# Patient Record
Sex: Female | Born: 1970
Health system: Southern US, Community
[De-identification: ages and names within clinical notes are randomized; demographics above are authoritative.]

## PROBLEM LIST (undated history)

## (undated) DIAGNOSIS — E559 Vitamin D deficiency, unspecified: Secondary | ICD-10-CM

## (undated) DIAGNOSIS — F988 Other specified behavioral and emotional disorders with onset usually occurring in childhood and adolescence: Secondary | ICD-10-CM

## (undated) DIAGNOSIS — I10 Essential (primary) hypertension: Secondary | ICD-10-CM

---

## 2004-09-23 HISTORY — PX: CHOLECYSTECTOMY: SHX55

## 2006-12-29 ENCOUNTER — Other Ambulatory Visit: Payer: Self-pay

## 2006-12-29 ENCOUNTER — Emergency Department: Payer: Self-pay | Admitting: Emergency Medicine

## 2007-01-22 ENCOUNTER — Ambulatory Visit: Payer: Self-pay | Admitting: Surgery

## 2007-01-29 ENCOUNTER — Ambulatory Visit: Payer: Self-pay | Admitting: Surgery

## 2008-02-05 ENCOUNTER — Ambulatory Visit: Payer: Self-pay | Admitting: Family Medicine

## 2012-01-16 ENCOUNTER — Emergency Department: Payer: Self-pay | Admitting: Emergency Medicine

## 2012-01-16 LAB — BASIC METABOLIC PANEL
Chloride: 107 mmol/L (ref 98–107)
Creatinine: 0.86 mg/dL (ref 0.60–1.30)
EGFR (African American): >60
Glucose: 92 mg/dL (ref 65–99)
Osmolality: 281 (ref 275–301)
Potassium: 4.1 mmol/L (ref 3.5–5.1)
Sodium: 140 mmol/L (ref 136–145)

## 2012-01-16 LAB — CBC
HCT: 38.7 % (ref 35.0–47.0)
HGB: 12.8 g/dL (ref 12.0–16.0)
MCH: 30.1 pg (ref 26.0–34.0)
MCHC: 33.1 g/dL (ref 32.0–36.0)
MCV: 91 fL (ref 80–100)
Platelet: 276 10*3/uL (ref 150–440)
RBC: 4.24 10*6/uL (ref 3.80–5.20)
RDW: 13.8 % (ref 11.5–14.5)
WBC: 9.4 10*3/uL (ref 3.6–11.0)

## 2012-01-16 LAB — TROPONIN I: Troponin-I: <0.02 ng/mL

## 2012-10-31 ENCOUNTER — Ambulatory Visit: Payer: Self-pay | Admitting: Emergency Medicine

## 2014-10-24 HISTORY — PX: HERNIA REPAIR: SHX51

## 2014-10-28 ENCOUNTER — Inpatient Hospital Stay: Payer: Self-pay | Admitting: Surgery

## 2014-10-28 LAB — COMPREHENSIVE METABOLIC PANEL
AST: 27 U/L (ref 15–37)
Albumin: 3.8 g/dL (ref 3.4–5.0)
Alkaline Phosphatase: 62 U/L (ref 46–116)
Anion Gap: 7 (ref 7–16)
BUN: 15 mg/dL (ref 7–18)
Bilirubin,Total: 0.4 mg/dL (ref 0.2–1.0)
Calcium, Total: 8.8 mg/dL (ref 8.5–10.1)
Chloride: 106 mmol/L (ref 98–107)
Co2: 28 mmol/L (ref 21–32)
Creatinine: 1.04 mg/dL (ref 0.60–1.30)
EGFR (African American): >60
EGFR (Non-African Amer.): >60
Glucose: 83 mg/dL (ref 65–99)
OSMOLALITY: 281 (ref 275–301)
POTASSIUM: 3.2 mmol/L — AB (ref 3.5–5.1)
SGPT (ALT): 27 U/L (ref 14–63)
Sodium: 141 mmol/L (ref 136–145)
TOTAL PROTEIN: 7.4 g/dL (ref 6.4–8.2)

## 2014-10-28 LAB — CBC
HCT: 37.8 % (ref 35.0–47.0)
HGB: 12.6 g/dL (ref 12.0–16.0)
MCH: 30 pg (ref 26.0–34.0)
MCHC: 33.4 g/dL (ref 32.0–36.0)
MCV: 90 fL (ref 80–100)
PLATELETS: 299 10*3/uL (ref 150–440)
RBC: 4.2 10*6/uL (ref 3.80–5.20)
RDW: 14.6 % — ABNORMAL HIGH (ref 11.5–14.5)
WBC: 7 10*3/uL (ref 3.6–11.0)

## 2014-10-28 LAB — URINALYSIS, COMPLETE
Bacteria: NONE SEEN
Specific Gravity: 1.015 (ref 1.003–1.030)
Squamous Epithelial: 1

## 2014-12-21 DIAGNOSIS — N921 Excessive and frequent menstruation with irregular cycle: Secondary | ICD-10-CM | POA: Insufficient documentation

## 2014-12-22 ENCOUNTER — Ambulatory Visit: Admit: 2014-12-22 | Disposition: A | Discharge status: Home / Self Care | Payer: Self-pay | Admitting: Family Medicine

## 2015-01-16 LAB — SURGICAL PATHOLOGY

## 2015-01-22 NOTE — H&P (Signed)
   Subjective/Chief Complaint Umbilical pain x 2 weeks, worsening, N/V x 3 days   History of Present Illness Cheyenne Walker is a pleasant 44 yo F with a history of cholecystectomy in 2008 who presents with 2 weeks of worsening umbilical pain, worse over the past day, and N/V x 3 days.  Was told that she had a hernia but that it was too small to fix.  Per ER attending, had a tender bulge which disappeared with pressure.  Still with pain.  Doing well prior to this.  No fevers/chills.  Having regular BM.   Past History H/o cholecystectomy 2008 HTN   Past Medical Health Hypertension   Code Status Full Code   Past Med/Surgical Hx:  HTN:   ALLERGIES:  No Known Allergies:   Family and Social History:  Family History Negative   Social History negative tobacco, negative ETOH, negative Illicit drugs   Place of Living Home   Review of Systems:  Subjective/Chief Complaint Umbilical pain, ? bulge, N/V   Fever/Chills No   Cough No   Sputum No   Abdominal Pain Yes   Diarrhea No   Constipation No   Nausea/Vomiting Yes   SOB/DOE No   Chest Pain No   Dysuria No   Tolerating Diet No  Nauseated  Vomiting   Physical Exam:  GEN well developed, well nourished, no acute distress, obese   HEENT pale conjunctivae, PERRL, good dentition   RESP normal resp effort  clear BS  no use of accessory muscles   CARD regular rate  No LE edema   ABD positive tenderness  positive hernia  No obvious umbilical hernia palpated due to body habitus   LYMPH negative neck, negative axillae   EXTR negative cyanosis/clubbing, negative edema   SKIN normal to palpation, No rashes, No ulcers   NEURO cranial nerves intact, L side weakness, strength:, motor/sensory function intact   PSYCH A+O to time, place, person, good insight, anxious    Assessment/Admission Diagnosis 44 yo with clinical history suggestive of umbilical hernia/incisional hernia but no obvious defect palpated.  Still with BM.  Still  uncomfortable.   Plan Likely with umbilical/incisional hernia.  Will obtain CT to assist with placement of incision and to ensure no other hernias presents.  Will discuss operative intervention pending CT findings.   Electronic Signatures: Floyde Parkins (MD)  (Signed [phone] 09:07)  Authored: CHIEF COMPLAINT and HISTORY, PAST MEDICAL/SURGIAL HISTORY, ALLERGIES, FAMILY AND SOCIAL HISTORY, REVIEW OF SYSTEMS, PHYSICAL EXAM, ASSESSMENT AND PLAN   Last Updated: 05-Feb-16 09:07 by Floyde Parkins (MD)

## 2015-01-22 NOTE — Op Note (Signed)
PATIENT NAME:  Cheyenne Walker, Cheyenne Walker MR#:  [id number] DATE OF BIRTH:  1970-10-07  DATE OF PROCEDURE:  10/28/2014  PREOPERATIVE DIAGNOSIS: Incarcerated ventral hernia.  POSTOPERATIVE DIAGNOSIS: Incarcerated ventral hernia.  PROCEDURES PERFORMED:  1.  Repair of incarcerated ventral hernia with small Ventralex patch.  2.  Partial omentectomy.   SURGEON: Marlyce Huge, MD  ANESTHESIA: General.   ESTIMATED BLOOD LOSS: Minimal.   COMPLICATIONS: None.   SPECIMEN: Incarcerated omentum.   INDICATION FOR SURGERY: [name] is a pleasant 44 year old female who presents with 1 day of periumbilical pain which was severe, nausea and vomiting and a bulge which was reduced by ED doctor. She was brought to the operating room for semiemergent umbilical hernia repair.   DETAILS OF PROCEDURE: Informed consent was obtained. [name] was brought to the operating room suite. She was induced. Endotracheal tube was placed. General anesthesia was administered. Her abdomen was prepped and draped in standard surgical fashion. A timeout was then performed correctly identifying the patient name, operative site and procedure to be performed. A periumbilical incision was made. It was deepened down to the fascia. The hernia sac was encountered. It was dissected out. It was opened. There was incarcerated omentum which could not be reduced. It was thus resected with Kelly clamps and 3-0 ties and then it was reduced. The fascial edges were cleared out. The defect measured approximately 2 x 1 cm. A 4 cm piece of Ventralex mesh was pushed through the defect and the defect was closed transversely using interrupted 0 Ethibond incorporating the mesh while doing so. These were  tied down. The wound was then irrigated. The umbilicus was tacked to the repair with a 3-0 Vicryl suture. The wound was then closed in 2 layers, an interrupted 3-0 Vicryl deep dermal and a 4-0 Monocryl running subcuticular. Steri-Strips, Tegaderm and Telfa  gauze were used to complete the dressing. The patient was then awoken, extubated and brought to the postanesthesia care unit. There were no immediate complications. Needle, sponge, and instrument count was correct at the end of the procedure.   ____________________________ Glena Norfolk. Preston Weill, MD cal:sb D: 10/28/2014 15:23:48 ET T: 10/28/2014 16:02:54 ET JOB#: [id number]  cc: Harrell Gave A. Sebron Mcmahill, MD, <Dictator> Floyde Parkins MD ELECTRONICALLY SIGNED 11/10/2014 22:27

## 2015-04-03 ENCOUNTER — Encounter: Payer: Self-pay | Admitting: Family Medicine

## 2015-04-03 DIAGNOSIS — D229 Melanocytic nevi, unspecified: Secondary | ICD-10-CM | POA: Insufficient documentation

## 2015-04-03 DIAGNOSIS — K429 Umbilical hernia without obstruction or gangrene: Secondary | ICD-10-CM | POA: Insufficient documentation

## 2015-04-03 DIAGNOSIS — F4310 Post-traumatic stress disorder, unspecified: Secondary | ICD-10-CM | POA: Insufficient documentation

## 2015-04-03 DIAGNOSIS — E8881 Metabolic syndrome: Secondary | ICD-10-CM | POA: Insufficient documentation

## 2015-04-03 DIAGNOSIS — F339 Major depressive disorder, recurrent, unspecified: Secondary | ICD-10-CM | POA: Insufficient documentation

## 2015-04-03 DIAGNOSIS — M722 Plantar fascial fibromatosis: Secondary | ICD-10-CM | POA: Insufficient documentation

## 2015-04-03 DIAGNOSIS — K219 Gastro-esophageal reflux disease without esophagitis: Secondary | ICD-10-CM | POA: Insufficient documentation

## 2015-04-03 DIAGNOSIS — Z6841 Body Mass Index (BMI) 40.0 and over, adult: Secondary | ICD-10-CM

## 2015-04-03 DIAGNOSIS — F419 Anxiety disorder, unspecified: Secondary | ICD-10-CM | POA: Insufficient documentation

## 2015-04-03 DIAGNOSIS — E559 Vitamin D deficiency, unspecified: Secondary | ICD-10-CM | POA: Insufficient documentation

## 2015-04-03 DIAGNOSIS — J309 Allergic rhinitis, unspecified: Secondary | ICD-10-CM | POA: Insufficient documentation

## 2015-04-03 DIAGNOSIS — I10 Essential (primary) hypertension: Secondary | ICD-10-CM | POA: Insufficient documentation

## 2015-04-06 ENCOUNTER — Ambulatory Visit: Payer: Self-pay | Admitting: Family Medicine

## 2015-05-31 ENCOUNTER — Ambulatory Visit: Payer: Self-pay | Admitting: Family Medicine

## 2015-06-12 ENCOUNTER — Ambulatory Visit (INDEPENDENT_AMBULATORY_CARE_PROVIDER_SITE_OTHER): Payer: BLUE CROSS/BLUE SHIELD | Admitting: Family Medicine

## 2015-06-12 ENCOUNTER — Encounter: Payer: Self-pay | Admitting: Family Medicine

## 2015-06-12 VITALS — BP 122/84 | HR 93 | Temp 98.4°F | Resp 16 | Ht 64.0 in | Wt 221.1 lb

## 2015-06-12 DIAGNOSIS — N926 Irregular menstruation, unspecified: Secondary | ICD-10-CM | POA: Diagnosis not present

## 2015-06-12 DIAGNOSIS — Z3201 Encounter for pregnancy test, result positive: Secondary | ICD-10-CM | POA: Diagnosis not present

## 2015-06-12 DIAGNOSIS — F329 Major depressive disorder, single episode, unspecified: Secondary | ICD-10-CM

## 2015-06-12 DIAGNOSIS — I10 Essential (primary) hypertension: Secondary | ICD-10-CM

## 2015-06-12 DIAGNOSIS — Z23 Encounter for immunization: Secondary | ICD-10-CM

## 2015-06-12 LAB — POCT URINE PREGNANCY: PREG TEST UR: POSITIVE — AB

## 2015-06-12 MED ORDER — PRENATAL VITAMIN PLUS LOW IRON 27-1 MG PO TABS: 1.0000 | ORAL_TABLET | Freq: Every day | ORAL | Status: DC

## 2015-06-12 MED ORDER — METHYLDOPA 250 MG PO TABS: 250.0000 mg | ORAL_TABLET | Freq: Three times a day (TID) | ORAL | Status: DC

## 2015-06-12 NOTE — Progress Notes (Signed)
Name: Cheyenne Walker   MRN: [id number]    DOB: 06-25-1971   Date:06/12/2015       Progress Note  Subjective  Chief Complaint  Chief Complaint  Patient presents with  . Menstrual Problem    Pt states has not had menstrual since july and will have episodes of abdominal cramping    HPI  Irregular Cycles: she states she skipped her cycle in May and June, had a cycle in July 5th, 2016 , but was on vacation and forgot to take her ocp's with her, she was going to re-start following her next cycle but that never came. She has been having some abdominal cramping intermittently. She is [id number]. She is married for almost 23 years, but their youngest child is 46 yo. She has noticed breast tenderness, no nausea, no increase in appetite.  Pregnancy test was positive today and she is shocked.   HTN: taking medications for swelling and bp, denies chest pain or SOB. BP has been at goal  Obesity: seeing a provider at work and has been taking Phentermine for the past 10 days. Advised her to stop since she is pregnant.    Patient Active Problem List   Diagnosis Date Noted  . Anxiety 04/03/2015  . Benign essential HTN 04/03/2015  . Allergic rhinitis 04/03/2015  . Gastro-esophageal reflux disease without esophagitis 04/03/2015  . Major depressive episode 04/03/2015  . Extreme obesity 04/03/2015  . Dysmetabolic syndrome 93/57/0177  . Numerous moles 04/03/2015  . Plantar fasciitis 04/03/2015  . PTSD (post-traumatic stress disorder) 04/03/2015  . Vitamin D deficiency 04/03/2015  . Umbilical hernia without obstruction and without gangrene 04/03/2015  . Excessive and frequent menstruation with irregular cycle 12/21/2014    History reviewed. No pertinent past surgical history.  History reviewed. No pertinent family history.  Social History   Social History  . Marital Status: Married    Spouse Name: N/A  . Number of Children: N/A  . Years of Education: N/A   Occupational History  . Not on  file.   Social History Main Topics  . Smoking status: Never Smoker   . Smokeless tobacco: Not on file  . Alcohol Use: 0.0 oz/week    0 Standard drinks or equivalent per week  . Drug Use: No  . Sexual Activity: Yes   Other Topics Concern  . Not on file   Social History Narrative  . No narrative on file     Current outpatient prescriptions:  .  ibuprofen (ADVIL,MOTRIN) 800 MG tablet, Take 800 mg by mouth 2 (two) times daily., Disp: , Rfl: 1  Allergies  Allergen Reactions  . Ace Inhibitors     cough     ROS  Constitutional: Negative for fever or weight change.  Respiratory: Negative for cough and shortness of breath.   Cardiovascular: Negative for chest pain or palpitations.  Gastrointestinal:mild pelvic abdominal pain, no bowel changes.  Musculoskeletal: Negative for gait problem or joint swelling.  Skin: Negative for rash.  Neurological: Negative for dizziness or headache.  No other specific complaints in a complete review of systems (except as listed in HPI above).  Objective  Filed Vitals:   06/12/15 1416  BP: 122/84  Pulse: 93  Temp: 98.4 F (36.9 C)  TempSrc: Oral  Resp: 16  Height: 5\' 4"  (1.626 m)  Weight: 221 lb 1.6 oz (100.29 kg)  SpO2: 97%    Body mass index is 37.93 kg/(m^2).  Physical Exam  Constitutional: Patient appears well-developed and well-nourished. Obese  No distress.  HEENT: head atraumatic, normocephalic, pupils equal and reactive to light,neck supple, throat within normal limits Cardiovascular: Normal rate, regular rhythm and normal heart sounds.  No murmur heard. No BLE edema. Pulmonary/Chest: Effort normal and breath sounds normal. No respiratory distress. Abdominal: Soft.  There is no tenderness. Psychiatric: Patient has a normal mood and affect. behavior is normal. Judgment and thought content normal.   Recent Results (from the past 2160 hour(s))  POCT urine pregnancy     Status: Abnormal   Collection Time: 06/12/15  2:34 PM   Result Value Ref Range   Preg Test, Ur Positive (A) Negative    PHQ2/9: Depression screen PHQ 2/9 06/12/2015  Decreased Interest 0  Down, Depressed, Hopeless 1  PHQ - 2 Score 1    Fall Risk: Fall Risk  06/12/2015  Falls in the past year? No    Functional Status Survey: Is the patient deaf or have difficulty hearing?: No Does the patient have difficulty seeing, even when wearing glasses/contacts?: Yes (glasses) Does the patient have difficulty concentrating, remembering, or making decisions?: No Does the patient have difficulty walking or climbing stairs?: No Does the patient have difficulty dressing or bathing?: No Does the patient have difficulty doing errands alone such as visiting a doctor's office or shopping?: No   Assessment & Plan  1. Benign essential HTN  Change to medication that is safe during pregnancy  - methyldopa (ALDOMET) 250 MG tablet; Take 1 tablet (250 mg total) by mouth 3 (three) times daily.  Dispense: 90 tablet; Refill: 0  2. Menstrual periods irregular  - POCT urine pregnancy - positive LMP:03/28/2015 Due date: April 11th, 2017 Gestational Age [redacted] weeks 6 days  3. Pregnancy test positive  Offered to refer her to OB, but she wants to discuss it with her husband first. Advised to avoid tobacco use, alcohol use, and start prenatal vitamins until she sees OB  4. Needs flu shot  - Flu Vaccine QUAD 36+ mos PF IM (Fluarix & Fluzone Quad PF) - she will get it at work   5. Major depressive episode  Feeling down still, but does not want to take medication or see therapist, she denies suicidal thoughts or ideation

## 2015-06-20 ENCOUNTER — Other Ambulatory Visit: Payer: Self-pay | Admitting: Family Medicine

## 2015-06-21 NOTE — Telephone Encounter (Signed)
I am forwarding this encounter to the designated PCP and/or their nursing staff for further management of the tasks requested. Thank you.  

## 2015-06-26 ENCOUNTER — Other Ambulatory Visit: Payer: Self-pay

## 2015-06-29 ENCOUNTER — Encounter: Payer: Self-pay | Admitting: Family Medicine

## 2015-06-29 ENCOUNTER — Ambulatory Visit (INDEPENDENT_AMBULATORY_CARE_PROVIDER_SITE_OTHER): Payer: BLUE CROSS/BLUE SHIELD | Admitting: Family Medicine

## 2015-06-29 VITALS — BP 126/82 | HR 97 | Temp 98.2°F | Resp 16 | Ht 64.0 in | Wt 214.3 lb

## 2015-06-29 DIAGNOSIS — Z8759 Personal history of other complications of pregnancy, childbirth and the puerperium: Secondary | ICD-10-CM | POA: Diagnosis not present

## 2015-06-29 DIAGNOSIS — I10 Essential (primary) hypertension: Secondary | ICD-10-CM

## 2015-06-29 DIAGNOSIS — Z3040 Encounter for surveillance of contraceptives, unspecified: Secondary | ICD-10-CM | POA: Diagnosis not present

## 2015-06-29 LAB — POCT URINE PREGNANCY: PREG TEST UR: POSITIVE — AB

## 2015-06-29 MED ORDER — TRIAMTERENE-HCTZ 50-25 MG PO CAPS: 1.0000 | ORAL_CAPSULE | ORAL | Status: DC

## 2015-06-29 MED ORDER — NORGESTIMATE-ETH ESTRADIOL 0.25-35 MG-MCG PO TABS: 1.0000 | ORAL_TABLET | Freq: Every day | ORAL | Status: DC

## 2015-06-29 NOTE — Progress Notes (Addendum)
Name: Cheyenne Walker   MRN: [id number]    DOB: 1970-12-02   Date:06/29/2015       Progress Note  Subjective  Chief Complaint  Chief Complaint  Patient presents with  . Contraception    Patient terminated pregnant on 06/17/2015 in [place] at [facility] and had the aspiration abortion suction which was successful. After completed they checked by ultrasound to make sure everything was gone due to patient being 12 weeks.     HPI  Contraception: she was [redacted] weeks pregnant when she found out and went to [facility] and had an abortion. She is feeling guilty about it, her husband was there with her. She was given ocp by them, but it was stolen, she has been off ocp since Sunday and spotted on Monday, she had not intercourse since. She wants to go back on Orthocyclen. Explained importance of condoms also for the next month to avoid another unwanted pregnancy. Denies abdominal cramping or vaginal discharge. No fever.  HTN: never filled Methyldopa but needs refill of bp medication. BP is at goal today, denies chest pain or palpitation  Patient Active Problem List   Diagnosis Date Noted  . Anxiety 04/03/2015  . Benign essential HTN 04/03/2015  . Allergic rhinitis 04/03/2015  . Gastro-esophageal reflux disease without esophagitis 04/03/2015  . Major depressive episode 04/03/2015  . Extreme obesity ([place]) 04/03/2015  . Dysmetabolic syndrome 84/66/5993  . Numerous moles 04/03/2015  . Plantar fasciitis 04/03/2015  . PTSD (post-traumatic stress disorder) 04/03/2015  . Vitamin D deficiency 04/03/2015  . Umbilical hernia without obstruction and without gangrene 04/03/2015    Past Surgical History  Procedure Laterality Date  . Hernia repair  10/2014  . Cholecystectomy  2006    Family History  Problem Relation Age of Onset  . Hypertension Mother   . Hypertension Sister   . Hypertension Brother     Social History   Social History  . Marital Status: Married    Spouse Name:  N/A  . Number of Children: N/A  . Years of Education: N/A   Occupational History  . Not on file.   Social History Main Topics  . Smoking status: Never Smoker   . Smokeless tobacco: Never Used  . Alcohol Use: No  . Drug Use: No  . Sexual Activity: Yes   Other Topics Concern  . Not on file   Social History Narrative     Current outpatient prescriptions:  .  ibuprofen (ADVIL,MOTRIN) 800 MG tablet, Take 800 mg by mouth 2 (two) times daily., Disp: , Rfl: 1 .  norgestimate-ethinyl estradiol (ORTHO-CYCLEN, 28,) 0.25-35 MG-MCG tablet, Take 1 tablet by mouth daily., Disp: 1 Package, Rfl: 11 .  Prenatal Vit-Fe Fumarate-FA (PRENATAL VITAMIN PLUS LOW IRON) 27-1 MG TABS, Take 1 tablet by mouth daily. (Patient not taking: Reported on 06/29/2015), Disp: 30 tablet, Rfl: 0  Allergies  Allergen Reactions  . Ace Inhibitors     cough     ROS  Constitutional: Negative for fever or weight change.  Respiratory: Negative for cough and shortness of breath.   Cardiovascular: Negative for chest pain or palpitations.  Gastrointestinal: Negative for abdominal pain, no bowel changes.  Musculoskeletal: Negative for gait problem or joint swelling.  Skin: Negative for rash.  Neurological: Negative for dizziness or headache.  No other specific complaints in a complete review of systems (except as listed in HPI above).  Objective  Filed Vitals:   06/29/15 0936  BP: 126/82  Pulse: 97  Temp:  98.2 F (36.8 C)  TempSrc: Oral  Resp: 16  Height: 5\' 4"  (1.626 m)  Weight: 214 lb 4.8 oz (97.206 kg)  SpO2: 97%    Body mass index is 36.77 kg/(m^2).  Physical Exam  Constitutional: Patient appears well-developed and well-nourished. Obese No distress.  HEENT: head atraumatic, normocephalic, pupils equal and reactive to light, neck supple, throat within normal limits Cardiovascular: Normal rate, regular rhythm and normal heart sounds.  No murmur heard. No BLE edema. Pulmonary/Chest: Effort normal and  breath sounds normal. No respiratory distress. Abdominal: Soft.  There is no tenderness. Psychiatric: Patient has a normal mood and affect. behavior is normal. Judgment and thought content normal.  Recent Results (from the past 2160 hour(s))  POCT urine pregnancy     Status: Abnormal   Collection Time: 06/12/15  2:34 PM  Result Value Ref Range   Preg Test, Ur Positive (A) Negative  POCT urine pregnancy     Status: Abnormal   Collection Time: 06/29/15  9:48 AM  Result Value Ref Range   Preg Test, Ur Positive (A) Negative    Comment: Faint line     PHQ2/9: Depression screen PHQ 2/9 06/12/2015  Decreased Interest 0  Down, Depressed, Hopeless 1  PHQ - 2 Score 1     Fall Risk: Fall Risk  06/12/2015  Falls in the past year? No     Assessment & Plan  1. Abortion history  - POCT urine pregnancy positive, but she just had an abortion, very faint line - no symptoms, resume ocp She is feeling guilty, and already has a history of depression but she does not want to take medications at this time, advised her to return sooner if needed  2. Encounter for surveillance of contraceptives  - norgestimate-ethinyl estradiol (ORTHO-CYCLEN, 28,) 0.25-35 MG-MCG tablet; Take 1 tablet by mouth daily.  Dispense: 1 Package; Refill: 11  3. Benign essential HTN  Refills of medication - triamterene-hydrochlorothiazide (DYAZIDE) 50-25 MG capsule; Take 1 capsule by mouth every morning.  Dispense: 30 capsule; Refill: 3

## 2015-10-02 ENCOUNTER — Ambulatory Visit: Payer: BLUE CROSS/BLUE SHIELD | Admitting: Family Medicine

## 2015-10-12 ENCOUNTER — Ambulatory Visit: Payer: BLUE CROSS/BLUE SHIELD | Admitting: Family Medicine

## 2016-02-02 ENCOUNTER — Emergency Department
Admission: EM | Admit: 2016-02-02 | Discharge: 2016-02-02 | Disposition: A | Discharge status: Home / Self Care | Payer: BLUE CROSS/BLUE SHIELD | Attending: Emergency Medicine | Admitting: Emergency Medicine

## 2016-02-02 DIAGNOSIS — T162XXA Foreign body in left ear, initial encounter: Secondary | ICD-10-CM | POA: Diagnosis present

## 2016-02-02 DIAGNOSIS — Z791 Long term (current) use of non-steroidal anti-inflammatories (NSAID): Secondary | ICD-10-CM | POA: Diagnosis not present

## 2016-02-02 DIAGNOSIS — I10 Essential (primary) hypertension: Secondary | ICD-10-CM | POA: Diagnosis not present

## 2016-02-02 DIAGNOSIS — Y999 Unspecified external cause status: Secondary | ICD-10-CM | POA: Diagnosis not present

## 2016-02-02 DIAGNOSIS — Z79899 Other long term (current) drug therapy: Secondary | ICD-10-CM | POA: Diagnosis not present

## 2016-02-02 DIAGNOSIS — Y929 Unspecified place or not applicable: Secondary | ICD-10-CM | POA: Insufficient documentation

## 2016-02-02 DIAGNOSIS — X58XXXA Exposure to other specified factors, initial encounter: Secondary | ICD-10-CM | POA: Insufficient documentation

## 2016-02-02 DIAGNOSIS — F329 Major depressive disorder, single episode, unspecified: Secondary | ICD-10-CM | POA: Diagnosis not present

## 2016-02-02 DIAGNOSIS — Y939 Activity, unspecified: Secondary | ICD-10-CM | POA: Insufficient documentation

## 2016-02-02 DIAGNOSIS — E669 Obesity, unspecified: Secondary | ICD-10-CM | POA: Diagnosis not present

## 2016-02-02 MED ORDER — LIDOCAINE VISCOUS 2 % MT SOLN
OROMUCOSAL | Status: AC
  Filled 2016-02-02: qty 15

## 2016-02-02 MED ORDER — CIPROFLOXACIN-HYDROCORTISONE 0.2-1 % OT SUSP: 4.0000 [drp] | Freq: Two times a day (BID) | OTIC | Status: AC

## 2016-02-02 NOTE — ED Notes (Signed)
Patient presents hysterical with reports of waking up with something in her LEFT ear. Patient reports that it is moving and is painful. MD to bedside immediately for assessment.

## 2016-02-02 NOTE — ED Notes (Addendum)
Owens Shark, MD to bedside.

## 2016-02-02 NOTE — ED Provider Notes (Signed)
[facility] Emergency Department Provider Note  ____________________________________________  Time seen: 12:35 AM  I have reviewed the triage vital signs and the nursing notes.   HISTORY  Chief Complaint Foreign Body in Ear     HPI Cheyenne Walker is a 45 y.o. female presents with awakening with left ear pain this morning patient states that there is a bug in her ear.     No past medical history on file.  Patient Active Problem List   Diagnosis Date Noted  . Anxiety 04/03/2015  . Benign essential HTN 04/03/2015  . Allergic rhinitis 04/03/2015  . Gastro-esophageal reflux disease without esophagitis 04/03/2015  . Major depressive episode 04/03/2015  . Extreme obesity ([place]) 04/03/2015  . Dysmetabolic syndrome [id number]  . Numerous moles 04/03/2015  . Plantar fasciitis 04/03/2015  . PTSD (post-traumatic stress disorder) 04/03/2015  . Vitamin D deficiency 04/03/2015  . Umbilical hernia without obstruction and without gangrene 04/03/2015    Past Surgical History  Procedure Laterality Date  . Hernia repair  10/2014  . Cholecystectomy  2006    Current Outpatient Rx  Name  Route  Sig  Dispense  Refill  . ibuprofen (ADVIL,MOTRIN) 800 MG tablet   Oral   Take 800 mg by mouth 2 (two) times daily.      1   . norgestimate-ethinyl estradiol (ORTHO-CYCLEN, 28,) 0.25-35 MG-MCG tablet   Oral   Take 1 tablet by mouth daily.   1 Package   11   . Prenatal Vit-Fe Fumarate-FA (PRENATAL VITAMIN PLUS LOW IRON) 27-1 MG TABS   Oral   Take 1 tablet by mouth daily. Patient not taking: Reported on 06/29/2015   30 tablet   0   . triamterene-hydrochlorothiazide (DYAZIDE) 50-25 MG capsule   Oral   Take 1 capsule by mouth every morning.   30 capsule   3     Allergies Ace inhibitors  Family History  Problem Relation Age of Onset  . Hypertension Mother   . Hypertension Sister   . Hypertension Brother     Social History Social History  Substance  Use Topics  . Smoking status: Never Smoker   . Smokeless tobacco: Never Used  . Alcohol Use: No    Review of Systems  Constitutional: Negative for fever. Eyes: Negative for visual changes. ENT: Negative for sore throat.Positive for insect in left ear Cardiovascular: Negative for chest pain. Respiratory: Negative for shortness of breath. Gastrointestinal: Negative for abdominal pain, vomiting and diarrhea. Genitourinary: Negative for dysuria. Musculoskeletal: Negative for back pain. Skin: Negative for rash. Neurological: Negative for headaches, focal weakness or numbness.   10-point ROS otherwise negative.  ____________________________________________   PHYSICAL EXAM:  VITAL SIGNS: ED Triage Vitals  Enc Vitals Group     BP --      Pulse --      Resp --      Temp --      Temp src --      SpO2 --      Weight --      Height --      Head Cir --      Peak Flow --      Pain Score --      Pain Loc --      Pain Edu? --      Excl. in [place]? --      Constitutional: Alert and oriented. Well appearing and in no distress. Eyes: Conjunctivae are normal. PERRL. Normal extraocular movements. ENT   Head:  Normocephalic and atraumatic.   Nose: No congestion/rhinnorhea.   Mouth/Throat: Mucous membranes are moist.   Neck: No stridor. Ears: Insect noted in the patient's left ear Hematological/Lymphatic/Immunilogical: No cervical lymphadenopathy. Cardiovascular: Normal rate, regular rhythm. Normal and symmetric distal pulses are present in all extremities. No murmurs, rubs, or gallops. Respiratory: Normal respiratory effort without tachypnea nor retractions. Breath sounds are clear and equal bilaterally. No wheezes/rales/rhonchi. Gastrointestinal: Soft and nontender. No distention. There is no CVA tenderness. Genitourinary: deferred Musculoskeletal: Nontender with normal range of motion in all extremities. No joint effusions.  No lower extremity tenderness nor  edema. Neurologic:  Normal speech and language. No gross focal neurologic deficits are appreciated. Speech is normal.  Skin:  Skin is warm, dry and intact. No rash noted. Psychiatric: Mood and affect are normal. Speech and behavior are normal. Patient exhibits appropriate insight and judgment.     INITIAL IMPRESSION / ASSESSMENT AND PLAN / ED COURSE  Pertinent labs & imaging results that were available during my care of the patient were reviewed by me and considered in my medical decision making (see chart for details).  Procedure note Insect removed from the patient's left external auditory canal using alligator forceps. Inspection of the external auditory canal following insect removal reveals abrasions in the canal however no injury to the tympanic membrane.  ____________________________________________   FINAL CLINICAL IMPRESSION(S) / ED DIAGNOSES  Final diagnoses:  Foreign body in ear, left, initial encounter      Gregor Hams, MD 02/02/16 0105

## 2016-02-02 NOTE — ED Notes (Signed)

## 2016-02-02 NOTE — ED Notes (Signed)
Viscous lidocaine placed into LEFT ear canal by Dr. Owens Shark.

## 2016-02-02 NOTE — Discharge Instructions (Signed)
Ear Foreign Body °An ear foreign body is an object that is stuck in your ear. The object is usually stuck in the ear canal. °CAUSES °In all ages of people, the most common foreign bodies are insects that enter the ear canal. It is common for young children to put objects into the ear canal. These may include pebbles, beads, parts of toys, and any other small objects that fit into the ear. In adults, objects such as cotton swabs may become lodged in the ear canal.  °SIGNS AND SYMPTOMS °A foreign body in the ear may cause: °· Pain. °· Buzzing or roaring sounds. °· Hearing loss. °· Ear drainage or bleeding. °· Nausea and vomiting. °· A feeling that your ear is full. °DIAGNOSIS °Your health care provider may be able to diagnose an ear foreign body based on the information that you provide, your symptoms, and a physical exam. Your health care provider may also perform tests, such as testing your hearing and your ear pressure, to check for infection or other problems that are caused by the foreign body in your ear. °TREATMENT °Treatment depends on what the foreign body is, the location of the foreign body in your ear, and whether or not the foreign body has injured any part of your inner ear. If the foreign body is visible to your health care provider, it may be possible to remove the foreign body using: °· A tool, such as medical tweezers (forceps) or a suction tube (catheter). °· Irrigation. This uses water to flush the foreign body out of your ear. This is used only if the foreign body is not likely to swell or enlarge when it is put in water. °If the foreign body is not visible or your health care provider was not able to remove the foreign body, you may be referred to a specialist for removal. You may also be prescribed antibiotic medicine or ear drops to prevent infection. If the foreign body has caused injury to other parts of your ear, you may need additional treatment. °HOME CARE INSTRUCTIONS °· Keep all  follow-up visits as directed by your health care provider. This is important. °· Take medicines only as directed by your health care provider. °· If you were prescribed an antibiotic medicine, finish it all even if you start to feel better. °PREVENTION °· Keep small objects out of reach of young children. Tell children not to put anything in their ears. °· Do not put anything in your ear, including cotton swabs, to clean your ears. Talk to your health care provider about how to clean your ears safely. °SEEK MEDICAL CARE IF: °· You have a headache. °· Your have blood coming from your ear. °· You have a fever. °· You have increased pain or swelling of your ear. °· Your hearing is reduced. °· You have discharge coming from your ear. °  °This information is not intended to replace advice given to you by your health care provider. Make sure you discuss any questions you have with your health care provider. °  °Document Released: 09/06/2000 Document Revised: 09/30/2014 Document Reviewed: 04/25/2014 °Elsevier Interactive Patient Education ©2016 [employer] ° °

## 2016-07-18 ENCOUNTER — Other Ambulatory Visit: Payer: Self-pay | Admitting: Family Medicine

## 2016-07-18 DIAGNOSIS — Z3040 Encounter for surveillance of contraceptives, unspecified: Secondary | ICD-10-CM

## 2016-07-18 NOTE — Telephone Encounter (Signed)
Patient requesting [facility]

## 2016-08-16 ENCOUNTER — Other Ambulatory Visit: Payer: Self-pay | Admitting: Family Medicine

## 2016-08-16 DIAGNOSIS — Z3040 Encounter for surveillance of contraceptives, unspecified: Secondary | ICD-10-CM

## 2016-08-21 NOTE — Telephone Encounter (Signed)
Number disconnected not able to inform patient

## 2016-10-15 ENCOUNTER — Emergency Department: Payer: BLUE CROSS/BLUE SHIELD

## 2016-10-15 ENCOUNTER — Encounter: Payer: Self-pay | Admitting: Emergency Medicine

## 2016-10-15 ENCOUNTER — Emergency Department
Admission: EM | Admit: 2016-10-15 | Discharge: 2016-10-15 | Disposition: A | Discharge status: Home / Self Care | Payer: BLUE CROSS/BLUE SHIELD | Attending: Emergency Medicine | Admitting: Emergency Medicine

## 2016-10-15 DIAGNOSIS — Z791 Long term (current) use of non-steroidal anti-inflammatories (NSAID): Secondary | ICD-10-CM | POA: Insufficient documentation

## 2016-10-15 DIAGNOSIS — I10 Essential (primary) hypertension: Secondary | ICD-10-CM | POA: Insufficient documentation

## 2016-10-15 DIAGNOSIS — R079 Chest pain, unspecified: Secondary | ICD-10-CM

## 2016-10-15 DIAGNOSIS — R0789 Other chest pain: Secondary | ICD-10-CM | POA: Insufficient documentation

## 2016-10-15 DIAGNOSIS — Z79899 Other long term (current) drug therapy: Secondary | ICD-10-CM | POA: Diagnosis not present

## 2016-10-15 HISTORY — DX: Essential (primary) hypertension: I10

## 2016-10-15 LAB — TROPONIN I

## 2016-10-15 LAB — BASIC METABOLIC PANEL
Anion gap: 9 (ref 5–15)
BUN: 20 mg/dL (ref 6–20)
CALCIUM: 8.9 mg/dL (ref 8.9–10.3)
CHLORIDE: 105 mmol/L (ref 101–111)
CO2: 25 mmol/L (ref 22–32)
CREATININE: 1.01 mg/dL — AB (ref 0.44–1.00)
Glucose, Bld: 96 mg/dL (ref 65–99)
Potassium: 3.3 mmol/L — ABNORMAL LOW (ref 3.5–5.1)
Sodium: 139 mmol/L (ref 135–145)

## 2016-10-15 LAB — CBC
HCT: 37.1 % (ref 35.0–47.0)
Hemoglobin: 13 g/dL (ref 12.0–16.0)
MCH: 30.5 pg (ref 26.0–34.0)
MCHC: 35.1 g/dL (ref 32.0–36.0)
MCV: 86.8 fL (ref 80.0–100.0)
PLATELETS: 350 10*3/uL (ref 150–440)
RBC: 4.27 MIL/uL (ref 3.80–5.20)
RDW: 14.9 % — AB (ref 11.5–14.5)
WBC: 9.6 10*3/uL (ref 3.6–11.0)

## 2016-10-15 IMAGING — CR DG CHEST 2V
1 series · 2 of 2 positions shown · non-contrast
Comparison: [DATE]

CLINICAL DATA: Substernal chest pain with radiation.

EXAM:
CHEST  2 VIEW

[Series 1: dg chest 2 view · 0.14mm/px · 2 of 2 slices shown]
[im 1/2]
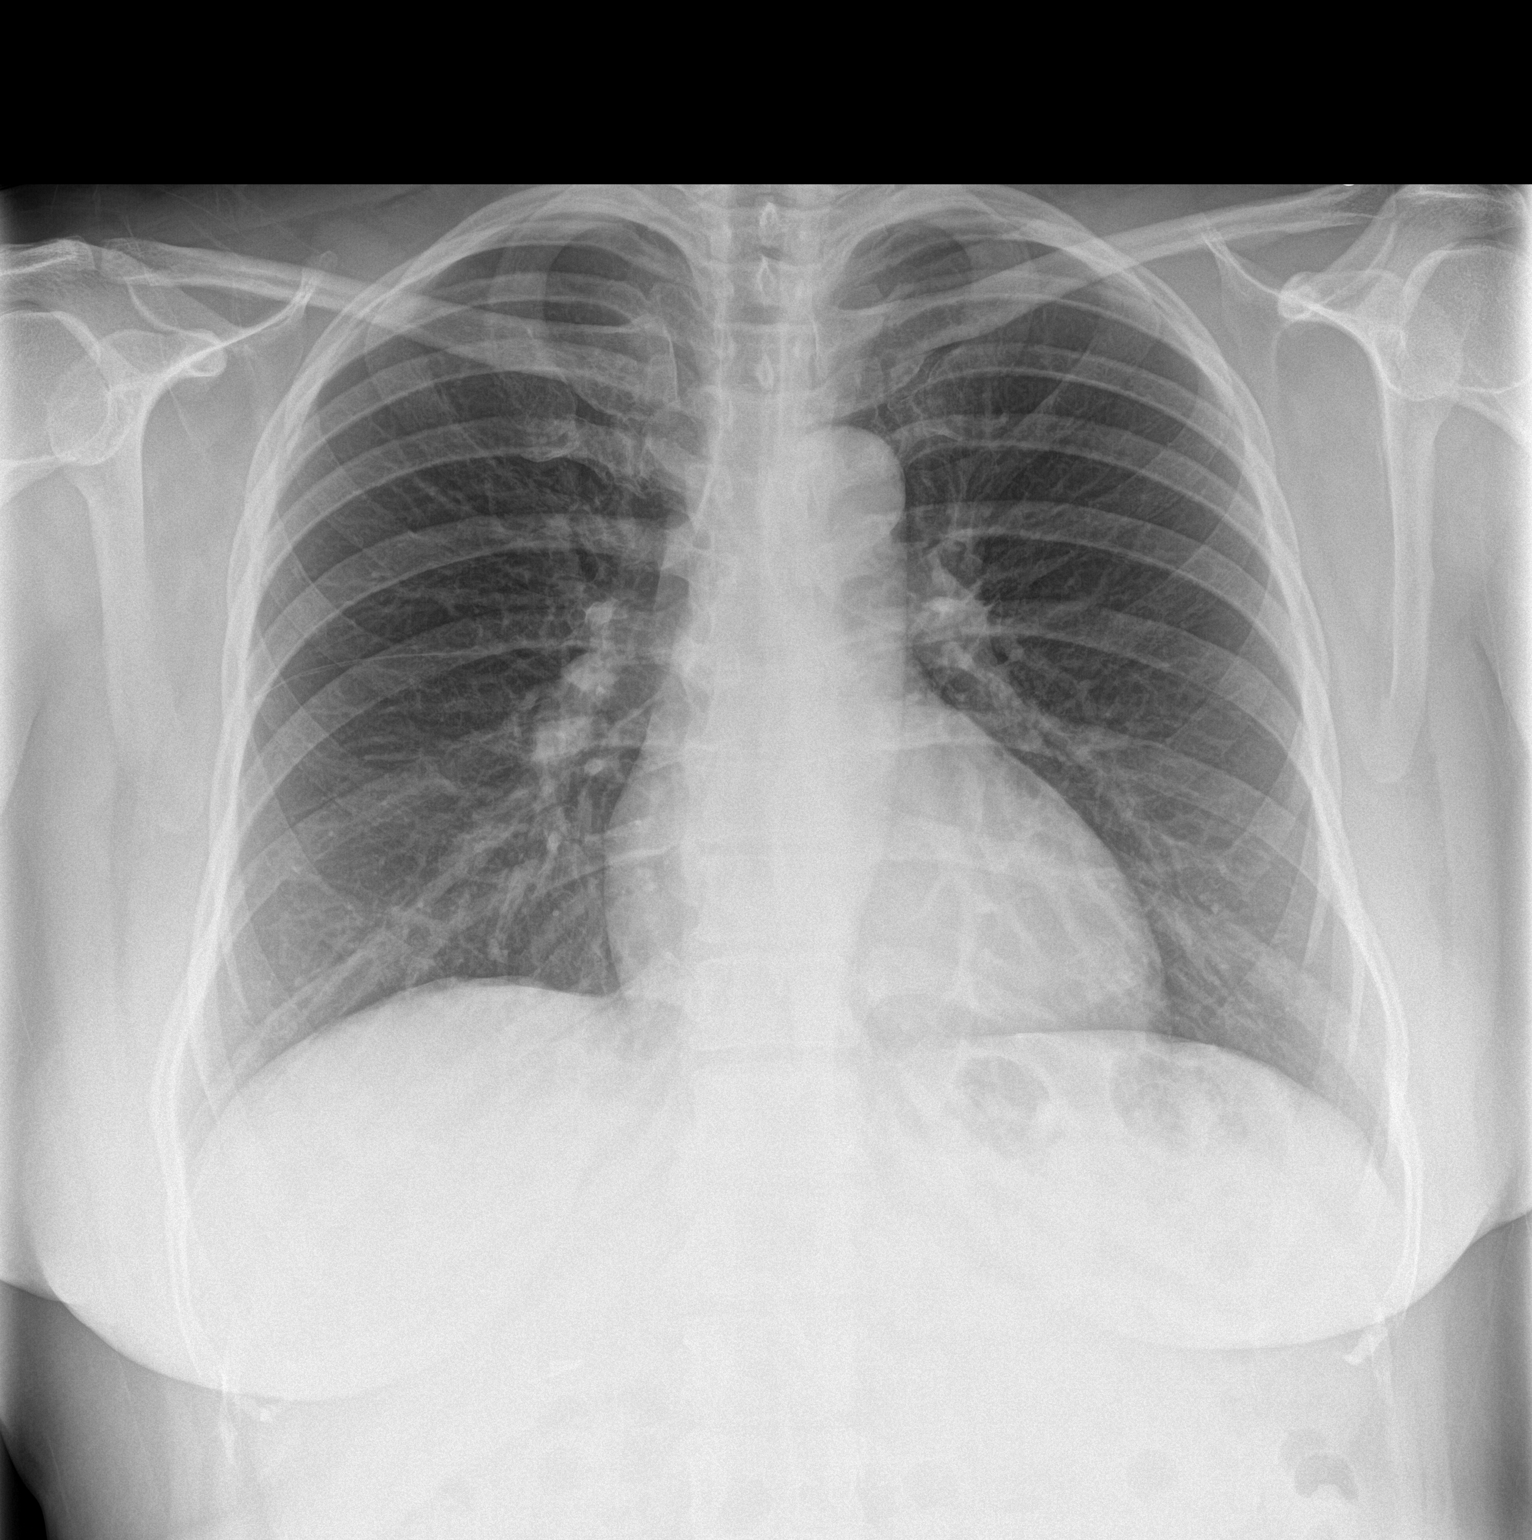
[im 2/2]
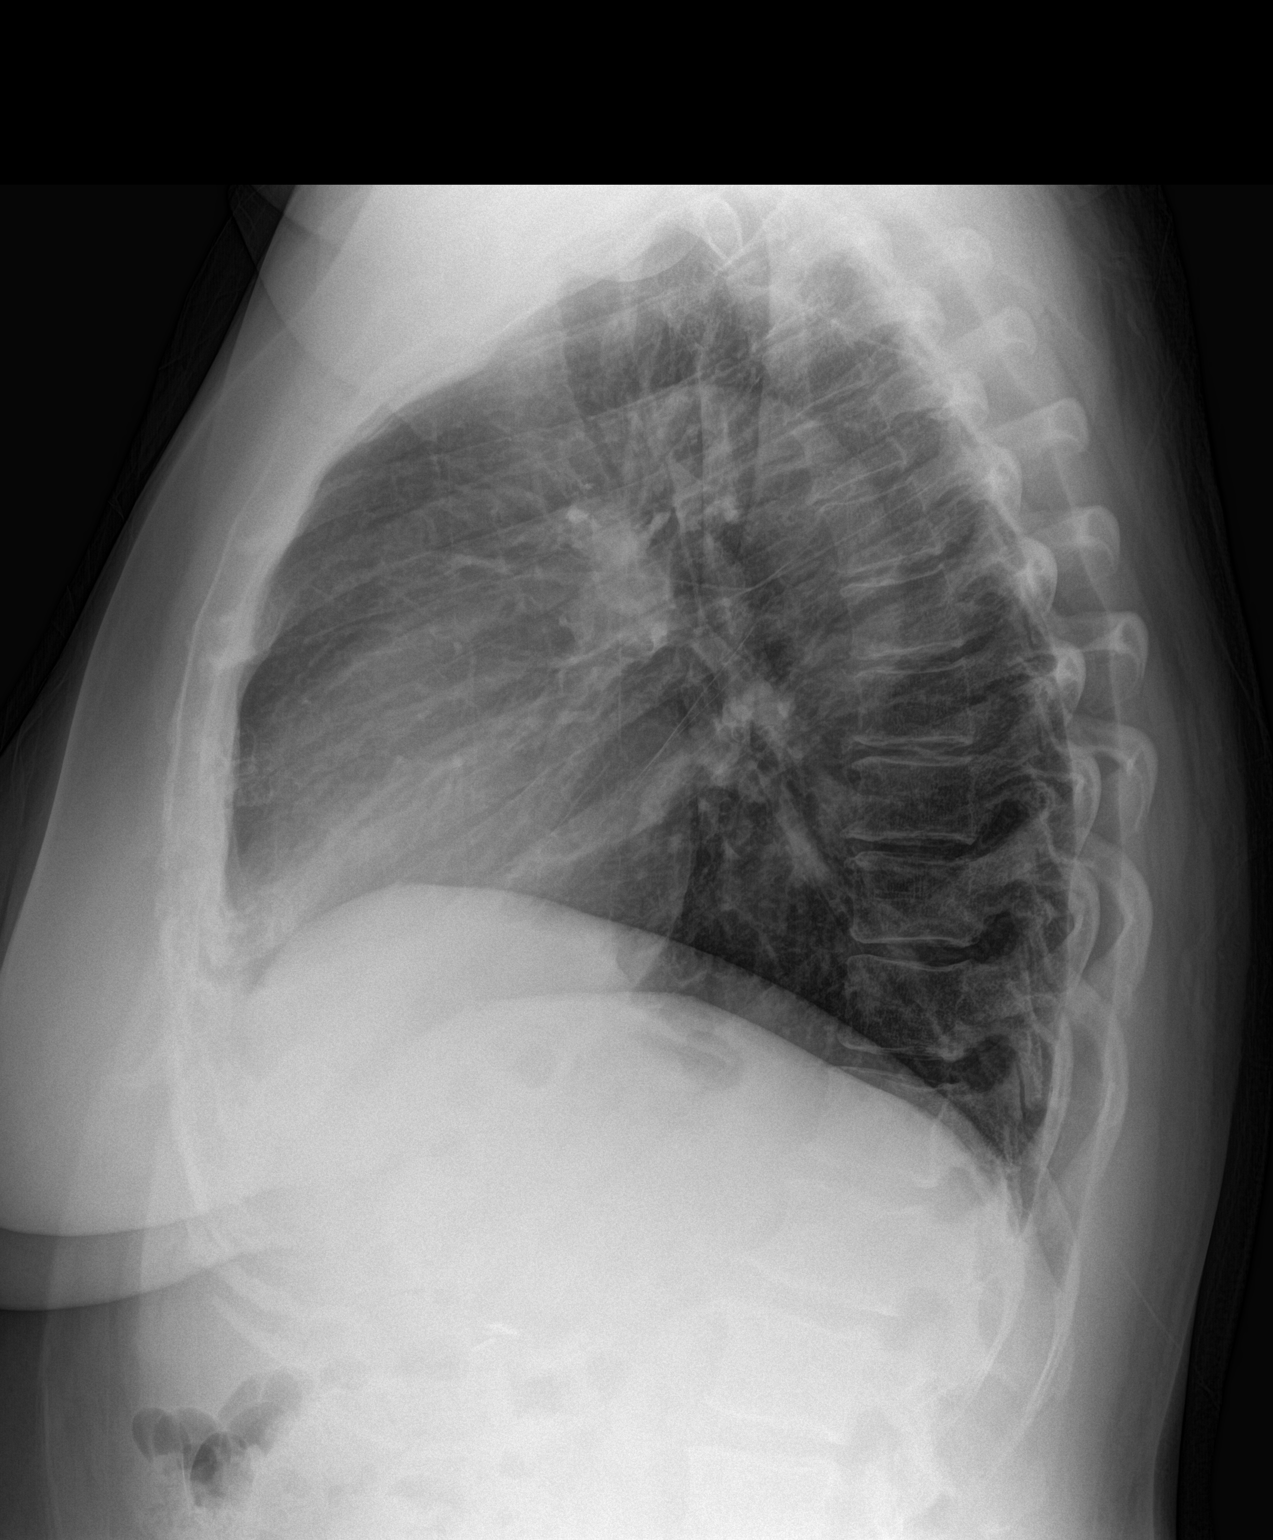

[2 of 2 positions shown; findings below may reference images not displayed]

FINDINGS: The lungs appear clear.  Cardiac and mediastinal contours normal.

No pleural effusion identified.

Minimal thoracic spondylosis.
IMPRESSION: No active cardiopulmonary disease.

## 2016-10-15 MED ORDER — GI COCKTAIL ~~LOC~~
30.0000 mL | Freq: Once | ORAL | Status: AC
  Administered 2016-10-15: 30 mL via ORAL
  Filled 2016-10-15: qty 30

## 2016-10-15 NOTE — ED Provider Notes (Signed)
[facility] Emergency Department Provider Note   ____________________________________________    I have reviewed the triage vital signs and the nursing notes.   HISTORY  Chief Complaint Chest Pain and Hypertension     HPI Cheyenne Walker is a 46 y.o. female who presents with complaints of mild chest discomfort. Patient reports she felt like her blood pressure may be up today because she felt a brief discomfort in her chest so she went to see her nurse at work. Her nurse checked her blood pressure found it to be high and sent her to the emergency department. Patient reports her pain has resolved and she feels well. No history of heart disease.   Past Medical History:  Diagnosis Date  . Hypertension     Patient Active Problem List   Diagnosis Date Noted  . Anxiety 04/03/2015  . Benign essential HTN 04/03/2015  . Allergic rhinitis 04/03/2015  . Gastro-esophageal reflux disease without esophagitis 04/03/2015  . Major depressive episode 04/03/2015  . Extreme obesity ([place]) 04/03/2015  . Dysmetabolic syndrome [id number]  . Numerous moles 04/03/2015  . Plantar fasciitis 04/03/2015  . PTSD (post-traumatic stress disorder) 04/03/2015  . Vitamin D deficiency 04/03/2015  . Umbilical hernia without obstruction and without gangrene 04/03/2015    Past Surgical History:  Procedure Laterality Date  . CHOLECYSTECTOMY  2006  . HERNIA REPAIR  10/2014    Prior to Admission medications   Medication Sig Start Date End Date Taking? Authorizing Provider  ibuprofen (ADVIL,MOTRIN) 800 MG tablet Take 800 mg by mouth 2 (two) times daily. 03/19/15  Yes Historical Provider, MD  [place] 28 0.25-35 MG-MCG tablet TAKE 1 TABLET BY MOUTH DAILY. 08/18/16  Yes Steele Sizer, MD  triamterene-hydrochlorothiazide (DYAZIDE) 50-25 MG capsule Take 1 capsule by mouth every morning. 06/29/15  Yes Steele Sizer, MD  Prenatal Vit-Fe Fumarate-FA (PRENATAL VITAMIN PLUS LOW IRON) 27-1  MG TABS Take 1 tablet by mouth daily. Patient not taking: Reported on 06/29/2015 06/12/15   Steele Sizer, MD     Allergies Ace inhibitors  Family History  Problem Relation Age of Onset  . Hypertension Mother   . Hypertension Sister   . Hypertension Brother     Social History Social History  Substance Use Topics  . Smoking status: Never Smoker  . Smokeless tobacco: Never Used  . Alcohol use No    Review of Systems  Constitutional: No fever/chills  CV: Regular rate and rhythm, normal heart sounds Respiratory: No increased work of breathing, clear to auscultation bilaterally  Gastrointestinal: No abdominal pain.  No nausea, no vomiting.   Genitourinary: Negative for dysuria. Musculoskeletal: Negative for back pain. Skin: Negative for rash. Neurological: Negative for neuro deficits  10 point review of systems otherwise negative  ____________________________________________   PHYSICAL EXAM:  VITAL SIGNS: ED Triage Vitals  Enc Vitals Group     BP 10/15/16 0939 (!) 185/104     Pulse Rate 10/15/16 0939 72     Resp 10/15/16 0939 20     Temp 10/15/16 0939 97.7 F (36.5 C)     Temp Source 10/15/16 0939 Oral     SpO2 10/15/16 0939 98 %     Weight 10/15/16 0940 220 lb (99.8 kg)     Height 10/15/16 0940 5\' 2"  (1.575 m)     Head Circumference --      Peak Flow --      Pain Score 10/15/16 0940 4     Pain Loc --  Pain Edu? --      Excl. in [place]? --     Constitutional: Alert and oriented. No acute distress. Pleasant and interactive  Nose: No congestion/rhinnorhea. Mouth/Throat: Mucous membranes are moist.   Cardiovascular: Normal rate, regular rhythm. Respiratory: Normal respiratory effort.  No retractions. Clear to auscultation bilaterally Genitourinary: deferred Musculoskeletal: No lower extremity tenderness nor edema.   Neurologic:  Normal speech and language. No gross focal neurologic deficits are appreciated.   Skin:  Skin is warm, dry and intact. No rash  noted.   ____________________________________________   LABS (all labs ordered are listed, but only abnormal results are displayed)  Labs Reviewed  BASIC METABOLIC PANEL - Abnormal; Notable for the following:       Result Value   Potassium 3.3 (*)    Creatinine, Ser 1.01 (*)    All other components within normal limits  CBC - Abnormal; Notable for the following:    RDW 14.9 (*)    All other components within normal limits  TROPONIN I   ____________________________________________  EKG  ED ECG REPORT I, Lavonia Drafts, the attending physician, personally viewed and interpreted this ECG.  Date: 10/15/2016  Rate: 66 Rhythm: normal sinus rhythm QRS Axis: normal Intervals: normal ST/T Wave abnormalities: normal Conduction Disturbances: none Narrative Interpretation: unremarkable  ____________________________________________  RADIOLOGY  Normal chest x-ray ____________________________________________   PROCEDURES  Procedure(s) performed: No    Critical Care performed: No ____________________________________________   INITIAL IMPRESSION / ASSESSMENT AND PLAN / ED COURSE  Pertinent labs & imaging results that were available during my care of the patient were reviewed by me and considered in my medical decision making (see chart for details).  Patient presents with brief episode of chest pain, now resolved. She is very well-appearing and in no distress. EKG is completely normal. Lab work is reassuring. Chest x-ray is benign. She is anxious to leave and is comfortable with outpatient follow-up. We discussed return precautions. She will follow-up with cardiology within 48 hours, no exertion at all clear by cardiology.   ____________________________________________   FINAL CLINICAL IMPRESSION(S) / ED DIAGNOSES  Final diagnoses:  Nonspecific chest pain      NEW MEDICATIONS STARTED DURING THIS VISIT:  Discharge Medication List as of 10/15/2016 11:35 AM        Note:  This document was prepared using Dragon voice recognition software and may include unintentional dictation errors.    Lavonia Drafts, MD 10/15/16 [phone]

## 2016-10-15 NOTE — ED Triage Notes (Signed)
Pt presents to ED via POV from work. Pt c/o substernal chest pain with radiation that started this morning. Pt states went to the nurse at her job who told her that her BP was elevated. Pt's BP 185/104 in triage.

## 2016-10-21 ENCOUNTER — Ambulatory Visit: Payer: BLUE CROSS/BLUE SHIELD | Admitting: Family Medicine

## 2016-11-20 LAB — HEMOGLOBIN A1C: Hemoglobin A1C: 4.8

## 2016-11-20 LAB — BASIC METABOLIC PANEL
BUN: 20 mg/dL (ref 4–21)
CREATININE: 0.6 mg/dL (ref 0.5–1.1)
Glucose: 76 mg/dL
Potassium: 3.6 mmol/L (ref 3.4–5.3)
SODIUM: 139 mmol/L (ref 137–147)

## 2016-11-20 LAB — LIPID PANEL
CHOLESTEROL: 178 mg/dL (ref 0–200)
HDL: 55 mg/dL (ref 35–70)
LDL CALC: 107 mg/dL
TRIGLYCERIDES: 79 mg/dL (ref 40–160)

## 2016-11-20 LAB — TSH: TSH: 1.18 u[IU]/mL (ref 0.41–5.90)

## 2016-11-20 LAB — VITAMIN D 25 HYDROXY (VIT D DEFICIENCY, FRACTURES): Vit D, 25-Hydroxy: 18.6

## 2016-11-22 ENCOUNTER — Encounter: Payer: Self-pay | Admitting: Family Medicine

## 2017-01-06 ENCOUNTER — Ambulatory Visit (INDEPENDENT_AMBULATORY_CARE_PROVIDER_SITE_OTHER): Payer: BLUE CROSS/BLUE SHIELD | Admitting: Family Medicine

## 2017-01-06 ENCOUNTER — Encounter: Payer: Self-pay | Admitting: Family Medicine

## 2017-01-06 VITALS — BP 138/80 | HR 65 | Temp 98.0°F | Resp 16 | Ht 62.0 in | Wt 236.2 lb

## 2017-01-06 DIAGNOSIS — I10 Essential (primary) hypertension: Secondary | ICD-10-CM | POA: Diagnosis not present

## 2017-01-06 DIAGNOSIS — E876 Hypokalemia: Secondary | ICD-10-CM

## 2017-01-06 DIAGNOSIS — Z113 Encounter for screening for infections with a predominantly sexual mode of transmission: Secondary | ICD-10-CM

## 2017-01-06 DIAGNOSIS — Z8759 Personal history of other complications of pregnancy, childbirth and the puerperium: Secondary | ICD-10-CM | POA: Diagnosis not present

## 2017-01-06 DIAGNOSIS — N912 Amenorrhea, unspecified: Secondary | ICD-10-CM | POA: Diagnosis not present

## 2017-01-06 DIAGNOSIS — Z309 Encounter for contraceptive management, unspecified: Secondary | ICD-10-CM | POA: Diagnosis not present

## 2017-01-06 LAB — POCT URINE PREGNANCY: PREG TEST UR: NEGATIVE

## 2017-01-06 NOTE — Progress Notes (Signed)
Name: Cheyenne Walker   MRN: [id number]    DOB: September 17, 1971   Date:01/06/2017       Progress Note  Subjective  Chief Complaint  Chief Complaint  Patient presents with  . Medication Refill  . Hypertension  . Menstrual Problem    HPI  Irregular Cycles: she does not like taking ocp's and skips medication, cycles are irregular for the past 6 months, skipped cycles. Skipped Nov, normal cycle in January and Feb, skipped marched, had breat tenderness two weeks ago , followed by only one day of cycle last week, currently having cramps which is not normal for her. She denies hot flashes or night sweats. Normal mood, no nipple discharge. No change in therapy. She has been off ocp's because she does not like the way it makes her feel. Sexually active ( new partner ) one month ago. No vaginal discharge or itching   Patient Active Problem List   Diagnosis Date Noted  . Anxiety 04/03/2015  . Benign essential HTN 04/03/2015  . Allergic rhinitis 04/03/2015  . Gastro-esophageal reflux disease without esophagitis 04/03/2015  . Major depressive episode 04/03/2015  . Extreme obesity 04/03/2015  . Dysmetabolic syndrome 71/69/6789  . Numerous moles 04/03/2015  . Plantar fasciitis 04/03/2015  . PTSD (post-traumatic stress disorder) 04/03/2015  . Vitamin D deficiency 04/03/2015  . Umbilical hernia without obstruction and without gangrene 04/03/2015    Past Surgical History:  Procedure Laterality Date  . CHOLECYSTECTOMY  2006  . HERNIA REPAIR  10/2014    Family History  Problem Relation Age of Onset  . Hypertension Mother   . Hypertension Sister   . Hypertension Brother     Social History   Social History  . Marital status: Legally Separated    Spouse name: N/A  . Number of children: 4  . Years of education: N/A   Occupational History  . inspection [employer]   Social History Main Topics  . Smoking status: Never Smoker  . Smokeless tobacco: Never Used  . Alcohol use No  .  Drug use: No  . Sexual activity: Yes   Other Topics Concern  . Not on file   Social History Narrative   She separated since Summer  2016, divorce will be finalized 03/2017   Currently dating her high school sweet heart     Current Outpatient Prescriptions:  .  fluticasone (FLONASE) 50 MCG/ACT nasal spray, SPRAY 2 SPRAYS (100 MCG) IN EACH NOSTRIL BY INTRANASAL ROUTE ONCE DAILY, Disp: , Rfl: 4 .  traMADol (ULTRAM) 50 MG tablet, Take 50 mg by mouth every 6 (six) hours., Disp: , Rfl: 0 .  triamterene-hydrochlorothiazide (DYAZIDE) 50-25 MG capsule, Take 1 capsule by mouth every morning., Disp: 30 capsule, Rfl: 3  Allergies  Allergen Reactions  . Ace Inhibitors Other (See Comments)    Cough. Pt states specifically catopril     ROS  Constitutional: Negative for fever, positive for  weight change.  Respiratory: Negative for cough and shortness of breath.   Cardiovascular: Negative for chest pain or palpitations.  Gastrointestinal: Negative for abdominal pain, no bowel changes.  Musculoskeletal: Negative for gait problem or joint swelling.  Skin: Negative for rash.  Neurological: Negative for dizziness or headache.  No other specific complaints in a complete review of systems (except as listed in HPI above).  Objective  Vitals:   01/06/17 1344  BP: 138/80  Pulse: 65  Resp: 16  Temp: 98 F (36.7 C)  SpO2: 99%  Weight: 236 lb  4 oz (107.2 kg)  Height: '5\' 2"'$  (1.575 m)    Body mass index is 43.21 kg/m.  Physical Exam  Constitutional: Patient appears well-developed and well-nourished. Obese  No distress.  HEENT: head atraumatic, normocephalic, pupils equal and reactive to light,  neck supple, throat within normal limits Cardiovascular: Normal rate, regular rhythm and normal heart sounds.  No murmur heard. No BLE edema. Pulmonary/Chest: Effort normal and breath sounds normal. No respiratory distress. Abdominal: Soft.  There is no tenderness. Psychiatric: Patient has a  normal mood and affect. behavior is normal. Judgment and thought content normal.  Recent Results (from the past 2160 hour(s))  Basic metabolic panel     Status: Abnormal   Collection Time: 10/15/16  9:43 AM  Result Value Ref Range   Sodium 139 135 - 145 mmol/L   Potassium 3.3 (L) 3.5 - 5.1 mmol/L   Chloride 105 101 - 111 mmol/L   CO2 25 22 - 32 mmol/L   Glucose, Bld 96 65 - 99 mg/dL   BUN 20 6 - 20 mg/dL   Creatinine, Ser 1.01 (H) 0.44 - 1.00 mg/dL   Calcium 8.9 8.9 - 10.3 mg/dL   GFR calc non Af Amer >60 >60 mL/min   GFR calc Af Amer >60 >60 mL/min    Comment: (NOTE) The eGFR has been calculated using the CKD EPI equation. This calculation has not been validated in all clinical situations. eGFR's persistently <60 mL/min signify possible Chronic Kidney Disease.    Anion gap 9 5 - 15  CBC     Status: Abnormal   Collection Time: 10/15/16  9:43 AM  Result Value Ref Range   WBC 9.6 3.6 - 11.0 K/uL   RBC 4.27 3.80 - 5.20 MIL/uL   Hemoglobin 13.0 12.0 - 16.0 g/dL   HCT 37.1 35.0 - 47.0 %   MCV 86.8 80.0 - 100.0 fL   MCH 30.5 26.0 - 34.0 pg   MCHC 35.1 32.0 - 36.0 g/dL   RDW 14.9 (H) 11.5 - 14.5 %   Platelets 350 150 - 440 K/uL  Troponin I     Status: None   Collection Time: 10/15/16  9:43 AM  Result Value Ref Range   Troponin I <0.03 <0.03 ng/mL  VITAMIN D 25 Hydroxy (Vit-D Deficiency, Fractures)     Status: None   Collection Time: 11/20/16 12:00 AM  Result Value Ref Range   Vit D, 25-Hydroxy 24.2   Basic metabolic panel     Status: None   Collection Time: 11/20/16 12:00 AM  Result Value Ref Range   Glucose 76 mg/dL   BUN 20 4 - 21 mg/dL   Creatinine 0.6 0.5 - 1.1 mg/dL   Potassium 3.6 3.4 - 5.3 mmol/L   Sodium 139 137 - 147 mmol/L  Lipid panel     Status: None   Collection Time: 11/20/16 12:00 AM  Result Value Ref Range   Triglycerides 79 40 - 160 mg/dL   Cholesterol 178 0 - 200 mg/dL   HDL 55 35 - 70 mg/dL   LDL Cholesterol 107 mg/dL  Hemoglobin A1c     Status:  None   Collection Time: 11/20/16 12:00 AM  Result Value Ref Range   Hemoglobin A1C 4.8   TSH     Status: None   Collection Time: 11/20/16 12:00 AM  Result Value Ref Range   TSH 1.18 0.41 - 5.90 uIU/mL      PHQ2/9: Depression screen [facility] 2/9 06/12/2015  Decreased Interest 0  Down, Depressed, Hopeless 1  PHQ - 2 Score 1     Fall Risk: Fall Risk  06/12/2015  Falls in the past year? No    Assessment & Plan  1. Amenorrhea  She does not like taking ocp's. Discussed IUD, but she would like to have tubal ligation - POCT urine pregnancy - GYN referral   2. 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 Abortion history  Back in 2016  4. Routine screening for STI (sexually transmitted infection)  - genprobe, she does not want to have lab drawn today  4. Routine screening for STI (sexually transmitted infection)  - GC/Chlamydia Probe Amp - HIV antibody (with reflex) ( refuses )  5. Hypokalemia  Needs potassium rich diet

## 2017-01-07 LAB — HIV ANTIBODY (ROUTINE TESTING W REFLEX): HIV 1&2 Ab, 4th Generation: NONREACTIVE

## 2017-01-07 LAB — GC/CHLAMYDIA PROBE AMP
CT PROBE, AMP APTIMA: NOT DETECTED
GC PROBE AMP APTIMA: NOT DETECTED

## 2017-01-13 ENCOUNTER — Other Ambulatory Visit: Payer: Self-pay | Admitting: Orthopedic Surgery

## 2017-01-13 DIAGNOSIS — M5126 Other intervertebral disc displacement, lumbar region: Secondary | ICD-10-CM

## 2017-01-24 ENCOUNTER — Ambulatory Visit
Admission: RE | Admit: 2017-01-24 | Discharge: 2017-01-24 | Disposition: A | Discharge status: Home / Self Care | Payer: BLUE CROSS/BLUE SHIELD | Source: Ambulatory Visit | Attending: Orthopedic Surgery | Admitting: Orthopedic Surgery

## 2017-01-24 DIAGNOSIS — M48061 Spinal stenosis, lumbar region without neurogenic claudication: Secondary | ICD-10-CM | POA: Insufficient documentation

## 2017-01-24 DIAGNOSIS — M5126 Other intervertebral disc displacement, lumbar region: Secondary | ICD-10-CM | POA: Insufficient documentation

## 2017-01-27 DIAGNOSIS — M48061 Spinal stenosis, lumbar region without neurogenic claudication: Secondary | ICD-10-CM | POA: Insufficient documentation

## 2017-02-13 ENCOUNTER — Encounter: Payer: BLUE CROSS/BLUE SHIELD | Admitting: Obstetrics and Gynecology

## 2017-02-26 ENCOUNTER — Encounter: Payer: BLUE CROSS/BLUE SHIELD | Admitting: Obstetrics and Gynecology

## 2017-05-01 ENCOUNTER — Ambulatory Visit (INDEPENDENT_AMBULATORY_CARE_PROVIDER_SITE_OTHER): Payer: 59 | Admitting: Family Medicine

## 2017-05-01 ENCOUNTER — Encounter: Payer: Self-pay | Admitting: Family Medicine

## 2017-05-01 VITALS — BP 130/86 | HR 73 | Temp 97.4°F | Resp 16 | Ht 62.0 in | Wt 226.8 lb

## 2017-05-01 DIAGNOSIS — M5137 Other intervertebral disc degeneration, lumbosacral region: Secondary | ICD-10-CM | POA: Insufficient documentation

## 2017-05-01 DIAGNOSIS — Z6841 Body Mass Index (BMI) 40.0 and over, adult: Secondary | ICD-10-CM | POA: Diagnosis not present

## 2017-05-01 DIAGNOSIS — I10 Essential (primary) hypertension: Secondary | ICD-10-CM

## 2017-05-01 DIAGNOSIS — M5416 Radiculopathy, lumbar region: Secondary | ICD-10-CM

## 2017-05-01 DIAGNOSIS — M5136 Other intervertebral disc degeneration, lumbar region: Secondary | ICD-10-CM

## 2017-05-01 MED ORDER — MELOXICAM 15 MG PO TABS: 15.0000 mg | ORAL_TABLET | Freq: Every day | ORAL | 0 refills | Status: DC

## 2017-05-01 MED ORDER — TRIAMTERENE-HCTZ 50-25 MG PO CAPS: 1.0000 | ORAL_CAPSULE | ORAL | 1 refills | Status: DC

## 2017-05-01 MED ORDER — GABAPENTIN 100 MG PO CAPS: 100.0000 mg | ORAL_CAPSULE | Freq: Every day | ORAL | 0 refills | Status: DC

## 2017-05-01 MED ORDER — TRAMADOL HCL 50 MG PO TABS: 50.0000 mg | ORAL_TABLET | Freq: Every evening | ORAL | 0 refills | Status: DC

## 2017-05-01 NOTE — Progress Notes (Signed)
Name: Cheyenne Walker   MRN: [id number]    DOB: 04/13/1971   Date:05/01/2017       Progress Note  Subjective  Chief Complaint  Chief Complaint  Patient presents with  . Medication Refill    4 month F/U  . Hypertension    Denies any symptoms  . Back Pain    Onset-1 year, lower back pain and had MRI showed arthritis and has had shots in her back. But now her pain is back and having numbness down her left leg. Constant pain worst at night when laying down.  . Amenorrhea    Has not had a period since January    HPI   Back Pain: Has been seeing [facility]- went to the [facility] location to have a spinal injection, has tried PT in the past, and takes Tramadol PRN for pain which she says is not helping. She says wants to try something different to try to help her pain. Some numbness and tingling on left lower leg that radiates down to toes , none in hands, no weakness, no loss of bowel/bladder function.  Taking Tramadol; was Rx'd flexeril but she is out of this now. No longer takes Meloxicam.  HTN: Has been a chronic issue since her last pregnancy in 2003. She states she had an on-site Nurse Practitioner at work that was able to prescribe her BP meds for the last year, but this program was stopped last month.  She takes medication every morning and has not been out of the medication yet, but needs further refills.  No chest pain, some mild shortness of breath. She does endorse some intermittent palpitations - also notes history of heart murmur.   Obesity: She has lost 20lbs since last visit. Wants to be <200lbs by the end of the year. Drinking a lot of water, walking when she can, works 12 hour shifts, plans to go to the gym or the park depending on the season. Tries not to eat after 5pm, eats a lot of veggies and fish and fruit, avoids red meat; eats smaller portions.  Patient Active Problem List   Diagnosis Date Noted  . Anxiety 04/03/2015  . Benign essential HTN 04/03/2015  . Allergic  rhinitis 04/03/2015  . Gastro-esophageal reflux disease without esophagitis 04/03/2015  . Major depressive episode 04/03/2015  . Extreme obesity 04/03/2015  . Dysmetabolic syndrome 54/65/0354  . Numerous moles 04/03/2015  . Plantar fasciitis 04/03/2015  . PTSD (post-traumatic stress disorder) 04/03/2015  . Vitamin D deficiency 04/03/2015  . Umbilical hernia without obstruction and without gangrene 04/03/2015    Past Surgical History:  Procedure Laterality Date  . CHOLECYSTECTOMY  2006  . HERNIA REPAIR  10/2014    Family History  Problem Relation Age of Onset  . Hypertension Mother   . Hypertension Sister   . Hypertension Brother     Social History   Social History  . Marital status: Divorced    Spouse name: N/A  . Number of children: 4  . Years of education: N/A   Occupational History  . inspection [employer]   Social History Main Topics  . Smoking status: Never Smoker  . Smokeless tobacco: Never Used  . Alcohol use No  . Drug use: No  . Sexual activity: Yes   Other Topics Concern  . Not on file   Social History Narrative   She separated since Summer  2016, divorce will be finalized 03/2017   Currently dating her high school sweet heart  Current Outpatient Prescriptions:  .  fluticasone (FLONASE) 50 MCG/ACT nasal spray, SPRAY 2 SPRAYS (100 MCG) IN EACH NOSTRIL BY INTRANASAL ROUTE ONCE DAILY, Disp: , Rfl: 4 .  traMADol (ULTRAM) 50 MG tablet, Take 50 mg by mouth every 6 (six) hours., Disp: , Rfl: 0 .  triamterene-hydrochlorothiazide (DYAZIDE) 50-25 MG capsule, Take 1 capsule by mouth every morning., Disp: 30 capsule, Rfl: 3  Allergies  Allergen Reactions  . Ace Inhibitors Other (See Comments)    Cough. Pt states specifically catopril     ROS  Constitutional: Negative for fever or weight change.  Respiratory: Negative for cough and shortness of breath.   Cardiovascular: Negative for chest pain or palpitations.  Gastrointestinal: Negative for  abdominal pain, no bowel changes.  Musculoskeletal: Negative for gait problem or joint swelling. +Back pain per HPI Skin: Negative for rash.  Neurological: Negative for dizziness or headache.  No other specific complaints in a complete review of systems (except as listed in HPI above).  Objective  Vitals:   05/01/17 1442  BP: 130/86  Pulse: 73  Resp: 16  Temp: (!) 97.4 F (36.3 C)  TempSrc: Oral  SpO2: 98%  Weight: 226 lb 12.8 oz (102.9 kg)  Height: 5\' 2"  (1.575 m)   Body mass index is 41.48 kg/m.  Physical Exam   Constitutional: Patient appears well-developed and well-nourished. No distress.  HENT: Head: Normocephalic and atraumatic.  Mouth/Throat: Oropharynx is clear and moist. No oropharyngeal exudate.  Eyes: Conjunctivae and EOM are normal. Pupils are equal, round, and reactive to light. No scleral icterus.  Neck: Normal range of motion. Neck supple. No JVD present. No thyromegaly present.  Cardiovascular: Normal rate, regular rhythm and normal heart sounds.  No murmur heard. No BLE edema. Pulmonary/Chest: Effort normal and breath sounds normal. No respiratory distress. Abdominal: Soft. Bowel sounds are normal, no distension. There is no tenderness. no masses Musculoskeletal: Normal range of motion, no joint effusions. Tenderness during palpation of left lumbar spine. Neurological: he is alert and oriented to person, place, and time. No cranial nerve deficit. Coordination, balance, strength, speech and gait are normal.  Skin: Skin is warm and dry. No rash noted. No erythema.  Psychiatric: Patient has a normal mood and affect. behavior is normal. Judgment and thought content normal.  No results found for this or any previous visit (from the past 2160 hour(s)).  PHQ2/9: Depression screen [facility] 2/9 05/01/2017 06/12/2015  Decreased Interest 0 0  Down, Depressed, Hopeless 0 1  PHQ - 2 Score 0 1   Fall Risk: Fall Risk  05/01/2017 06/12/2015  Falls in the past year? No No    Functional Status Survey: Is the patient deaf or have difficulty hearing?: No Does the patient have difficulty seeing, even when wearing glasses/contacts?: No Does the patient have difficulty concentrating, remembering, or making decisions?: No Does the patient have difficulty walking or climbing stairs?: No Does the patient have difficulty dressing or bathing?: No Does the patient have difficulty doing errands alone such as visiting a doctor's office or shopping?: No  Assessment & Plan  1. Disc degeneration, lumbar  - meloxicam (MOBIC) 15 MG tablet; Take 1 tablet (15 mg total) by mouth daily.  Dispense: 30 tablet; Refill: 0 - Ambulatory referral to Pain Clinic ( Dr. Holley Raring)   2. Benign essential HTN  - triamterene-hydrochlorothiazide (DYAZIDE) 50-25 MG capsule; Take 1 capsule by mouth every morning.  Dispense: 90 capsule; Refill: 1  3. Morbid obesity with BMI of 40.0-44.9, adult [facility])  Discussed  with the patient the risk posed by an increased BMI. Discussed importance of portion control, calorie counting and at least 150 minutes of physical activity weekly. Avoid sweet beverages and drink more water. Eat at least 6 servings of fruit and vegetables daily   4. Left lumbar radiculitis  - we will try adding gabapentin to improve symptoms at night , since that is the time that bothers her most.  - meloxicam (MOBIC) 15 MG tablet; Take 1 tablet (15 mg total) by mouth daily.  Dispense: 30 tablet; Refill: 0 - Ambulatory referral to Pain Clinic - traMADol (ULTRAM) 50 MG tablet; Take 1 tablet (50 mg total) by mouth every evening.  Dispense: 30 tablet; Refill: 0 - gabapentin (NEURONTIN) 100 MG capsule; Take 1-3 capsules (100-300 mg total) by mouth at bedtime.  Dispense: 90 capsule; Refill: 0

## 2017-06-10 ENCOUNTER — Telehealth: Payer: Self-pay | Admitting: Family Medicine

## 2017-06-10 DIAGNOSIS — I10 Essential (primary) hypertension: Secondary | ICD-10-CM

## 2017-06-10 NOTE — Telephone Encounter (Signed)
Cheyenne Walker had sent in triamterene-hydrochlorothiazide, however the patient states that she is not able to pick up this script. [facility] told her that this prescription is no longer on the market. Please prescribe something different.   Pt also tried contacting [facility] to get a referral due to her back pain. They told her that she will need a referral. Pt was referred somewhere else but rather go to [facility]. Please give pt a call once the referral has been placed.

## 2017-06-10 NOTE — Telephone Encounter (Signed)
Patient was never referred to ortho but to a pain specialist. If ok by Raquel Sarna then the referral will be changed.  Please advise.

## 2017-06-11 MED ORDER — TRIAMTERENE-HCTZ 37.5-25 MG PO TABS: 1.0000 | ORAL_TABLET | Freq: Every day | ORAL | 1 refills | Status: DC

## 2017-06-11 NOTE — Telephone Encounter (Signed)
We referred to Dr. Holley Raring with pain management because patient had wanted to try something different (alternative to PT/Ortho).  Please change to referral to ortho at [facility] if this is her preference.    I will discuss Dyazide prescription with Dr. Ancil Boozer (PCP) and will send in an alternative.  Thanks!

## 2017-06-11 NOTE — Telephone Encounter (Signed)
Maxzide Rx sent in to replace Dyazide. Please notify. Thanks!

## 2017-06-25 ENCOUNTER — Other Ambulatory Visit: Payer: Self-pay

## 2017-06-25 DIAGNOSIS — M5136 Other intervertebral disc degeneration, lumbar region: Secondary | ICD-10-CM

## 2017-06-25 MED ORDER — MELOXICAM 15 MG PO TABS: 15.0000 mg | ORAL_TABLET | Freq: Every day | ORAL | 0 refills | Status: DC

## 2017-06-25 NOTE — Telephone Encounter (Signed)
Pharmacy requesting Meloxicam refills on behalf of patient.   Last visit: 05/01/2017 for lumbar disc degeneration.

## 2017-06-25 NOTE — Telephone Encounter (Signed)
Is she feeling better, medication was supposed to be for short term

## 2017-06-27 ENCOUNTER — Other Ambulatory Visit: Payer: Self-pay

## 2017-06-27 DIAGNOSIS — M5136 Other intervertebral disc degeneration, lumbar region: Secondary | ICD-10-CM

## 2017-06-27 NOTE — Telephone Encounter (Signed)
Patient requesting refill of Meloxicam with a 90 day supply instead of a [facility].

## 2017-07-21 ENCOUNTER — Ambulatory Visit
Payer: 59 | Attending: Student in an Organized Health Care Education/Training Program | Admitting: Student in an Organized Health Care Education/Training Program

## 2017-10-31 ENCOUNTER — Ambulatory Visit (INDEPENDENT_AMBULATORY_CARE_PROVIDER_SITE_OTHER): Payer: 59 | Admitting: Family Medicine

## 2017-10-31 ENCOUNTER — Encounter: Payer: Self-pay | Admitting: Family Medicine

## 2017-10-31 VITALS — BP 108/72 | HR 75 | Temp 97.6°F | Resp 16 | Ht 62.0 in | Wt 242.1 lb

## 2017-10-31 DIAGNOSIS — Z01411 Encounter for gynecological examination (general) (routine) with abnormal findings: Secondary | ICD-10-CM | POA: Diagnosis not present

## 2017-10-31 DIAGNOSIS — Z23 Encounter for immunization: Secondary | ICD-10-CM

## 2017-10-31 DIAGNOSIS — R635 Abnormal weight gain: Secondary | ICD-10-CM

## 2017-10-31 DIAGNOSIS — Z124 Encounter for screening for malignant neoplasm of cervix: Secondary | ICD-10-CM

## 2017-10-31 DIAGNOSIS — Z01419 Encounter for gynecological examination (general) (routine) without abnormal findings: Secondary | ICD-10-CM

## 2017-10-31 DIAGNOSIS — E8881 Metabolic syndrome: Secondary | ICD-10-CM

## 2017-10-31 DIAGNOSIS — F339 Major depressive disorder, recurrent, unspecified: Secondary | ICD-10-CM

## 2017-10-31 DIAGNOSIS — J302 Other seasonal allergic rhinitis: Secondary | ICD-10-CM

## 2017-10-31 DIAGNOSIS — E559 Vitamin D deficiency, unspecified: Secondary | ICD-10-CM

## 2017-10-31 DIAGNOSIS — Z1231 Encounter for screening mammogram for malignant neoplasm of breast: Secondary | ICD-10-CM

## 2017-10-31 DIAGNOSIS — R5383 Other fatigue: Secondary | ICD-10-CM

## 2017-10-31 DIAGNOSIS — I10 Essential (primary) hypertension: Secondary | ICD-10-CM

## 2017-10-31 DIAGNOSIS — Z1239 Encounter for other screening for malignant neoplasm of breast: Secondary | ICD-10-CM

## 2017-10-31 DIAGNOSIS — Z13220 Encounter for screening for lipoid disorders: Secondary | ICD-10-CM

## 2017-10-31 MED ORDER — LORATADINE 10 MG PO TABS: 10.0000 mg | ORAL_TABLET | Freq: Every day | ORAL | 1 refills | Status: DC

## 2017-10-31 MED ORDER — TRIAMTERENE-HCTZ 37.5-25 MG PO TABS: 1.0000 | ORAL_TABLET | Freq: Every day | ORAL | 1 refills | Status: DC

## 2017-10-31 MED ORDER — DULOXETINE HCL 30 MG PO CPEP: 30.0000 mg | ORAL_CAPSULE | Freq: Every day | ORAL | 0 refills | Status: DC

## 2017-10-31 NOTE — Patient Instructions (Signed)
Preventive Care 40-64 Years, Female Preventive care refers to lifestyle choices and visits with your health care provider that can promote health and wellness. What does preventive care include?  A yearly physical exam. This is also called an annual well check.  Dental exams once or twice a year.  Routine eye exams. Ask your health care provider how often you should have your eyes checked.  Personal lifestyle choices, including: ? Daily care of your teeth and gums. ? Regular physical activity. ? Eating a healthy diet. ? Avoiding tobacco and drug use. ? Limiting alcohol use. ? Practicing safe sex. ? Taking low-dose aspirin daily starting at age 58. ? Taking vitamin and mineral supplements as recommended by your health care provider. What happens during an annual well check? The services and screenings done by your health care provider during your annual well check will depend on your age, overall health, lifestyle risk factors, and family history of disease. Counseling Your health care provider may ask you questions about your:  Alcohol use.  Tobacco use.  Drug use.  Emotional well-being.  Home and relationship well-being.  Sexual activity.  Eating habits.  Work and work Statistician.  Method of birth control.  Menstrual cycle.  Pregnancy history.  Screening You may have the following tests or measurements:  Height, weight, and BMI.  Blood pressure.  Lipid and cholesterol levels. These may be checked every 5 years, or more frequently if you are over 81 years old.  Skin check.  Lung cancer screening. You may have this screening every year starting at age 78 if you have a 30-pack-year history of smoking and currently smoke or have quit within the past 15 years.  Fecal occult blood test (FOBT) of the stool. You may have this test every year starting at age 65.  Flexible sigmoidoscopy or colonoscopy. You may have a sigmoidoscopy every 5 years or a colonoscopy  every 10 years starting at age 30.  Hepatitis C blood test.  Hepatitis B blood test.  Sexually transmitted disease (STD) testing.  Diabetes screening. This is done by checking your blood sugar (glucose) after you have not eaten for a while (fasting). You may have this done every 1-3 years.  Mammogram. This may be done every 1-2 years. Talk to your health care provider about when you should start having regular mammograms. This may depend on whether you have a family history of breast cancer.  BRCA-related cancer screening. This may be done if you have a family history of breast, ovarian, tubal, or peritoneal cancers.  Pelvic exam and Pap test. This may be done every 3 years starting at age 80. Starting at age 36, this may be done every 5 years if you have a Pap test in combination with an HPV test.  Bone density scan. This is done to screen for osteoporosis. You may have this scan if you are at high risk for osteoporosis.  Discuss your test results, treatment options, and if necessary, the need for more tests with your health care provider. Vaccines Your health care provider may recommend certain vaccines, such as:  Influenza vaccine. This is recommended every year.  Tetanus, diphtheria, and acellular pertussis (Tdap, Td) vaccine. You may need a Td booster every 10 years.  Varicella vaccine. You may need this if you have not been vaccinated.  Zoster vaccine. You may need this after age 5.  Measles, mumps, and rubella (MMR) vaccine. You may need at least one dose of MMR if you were born in  1955 or later. You may also need a second dose.  Pneumococcal 13-valent conjugate (PCV13) vaccine. You may need this if you have certain conditions and were not previously vaccinated.  Pneumococcal polysaccharide (PPSV23) vaccine. You may need one or two doses if you smoke cigarettes or if you have certain conditions.  Meningococcal vaccine. You may need this if you have certain  conditions.  Hepatitis A vaccine. You may need this if you have certain conditions or if you travel or work in places where you may be exposed to hepatitis A.  Hepatitis B vaccine. You may need this if you have certain conditions or if you travel or work in places where you may be exposed to hepatitis B.  Haemophilus influenzae type b (Hib) vaccine. You may need this if you have certain conditions.  Talk to your health care provider about which screenings and vaccines you need and how often you need them. This information is not intended to replace advice given to you by your health care provider. Make sure you discuss any questions you have with your health care provider. Document Released: 10/06/2015 Document Revised: 05/29/2016 Document Reviewed: 07/11/2015 Elsevier Interactive Patient Education  2018 [employer] Inc[employer]  
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
## 2017-11-01 LAB — COMPREHENSIVE METABOLIC PANEL
AG RATIO: 1.4 (calc) (ref 1.0–2.5)
ALKALINE PHOSPHATASE (APISO): 80 U/L (ref 33–115)
ALT: 25 U/L (ref 6–29)
AST: 22 U/L (ref 10–35)
Albumin: 4.4 g/dL (ref 3.6–5.1)
BUN: 20 mg/dL (ref 7–25)
CALCIUM: 9.6 mg/dL (ref 8.6–10.2)
CO2: 25 mmol/L (ref 20–32)
Chloride: 104 mmol/L (ref 98–110)
Creat: 0.84 mg/dL (ref 0.50–1.10)
Globulin: 3.1 g/dL (calc) (ref 1.9–3.7)
Glucose, Bld: 89 mg/dL (ref 65–139)
Potassium: 3.7 mmol/L (ref 3.5–5.3)
Sodium: 140 mmol/L (ref 135–146)
Total Bilirubin: 0.3 mg/dL (ref 0.2–1.2)
Total Protein: 7.5 g/dL (ref 6.1–8.1)

## 2017-11-01 LAB — VITAMIN D 25 HYDROXY (VIT D DEFICIENCY, FRACTURES): Vit D, 25-Hydroxy: 20 ng/mL — ABNORMAL LOW (ref 30–100)

## 2017-11-01 LAB — CBC WITH DIFFERENTIAL/PLATELET
BASOS ABS: 90 {cells}/uL (ref 0–200)
BASOS PCT: 1 %
EOS PCT: 3.1 %
Eosinophils Absolute: 279 cells/uL (ref 15–500)
HCT: 41.1 % (ref 35.0–45.0)
HEMOGLOBIN: 13.7 g/dL (ref 11.7–15.5)
Lymphs Abs: 4104 cells/uL — ABNORMAL HIGH (ref 850–3900)
MCH: 29.6 pg (ref 27.0–33.0)
MCHC: 33.3 g/dL (ref 32.0–36.0)
MCV: 88.8 fL (ref 80.0–100.0)
MONOS PCT: 7.8 %
MPV: 10.6 fL (ref 7.5–12.5)
NEUTROS ABS: 3825 {cells}/uL (ref 1500–7800)
Neutrophils Relative %: 42.5 %
Platelets: 380 10*3/uL (ref 140–400)
RBC: 4.63 10*6/uL (ref 3.80–5.10)
RDW: 13.8 % (ref 11.0–15.0)
Total Lymphocyte: 45.6 %
WBC mixed population: 702 cells/uL (ref 200–950)
WBC: 9 10*3/uL (ref 3.8–10.8)

## 2017-11-01 LAB — LIPID PANEL
CHOL/HDL RATIO: 3.5 (calc) (ref <5.0)
CHOLESTEROL: 166 mg/dL (ref <200)
HDL: 48 mg/dL — ABNORMAL LOW (ref >50)
LDL CHOLESTEROL (CALC): 94 mg/dL
Non-HDL Cholesterol (Calc): 118 mg/dL (calc) (ref <130)
TRIGLYCERIDES: 139 mg/dL (ref <150)

## 2017-11-01 LAB — HEMOGLOBIN A1C
EAG (MMOL/L): 5.4 (calc)
HEMOGLOBIN A1C: 5 %{Hb} (ref <5.7)
Mean Plasma Glucose: 97 (calc)

## 2017-11-01 LAB — TSH: TSH: 1.61 m[IU]/L

## 2017-11-01 LAB — VITAMIN B12: Vitamin B-12: 418 pg/mL (ref 200–1100)

## 2017-11-03 ENCOUNTER — Other Ambulatory Visit: Payer: Self-pay

## 2017-11-03 DIAGNOSIS — F339 Major depressive disorder, recurrent, unspecified: Secondary | ICD-10-CM

## 2017-11-03 MED ORDER — DULOXETINE HCL 30 MG PO CPEP: 30.0000 mg | ORAL_CAPSULE | Freq: Every day | ORAL | 0 refills | Status: DC

## 2017-11-03 NOTE — Telephone Encounter (Signed)
Refill request for general medication: Cymbalta 30 mg  Last office visit: 10/31/2017  Last physical exam: 10/31/2017  Follow-up on file. 11/10/2017  Her insurance will only cover 30 caps per day of the Duloxetine and not 60, so she needs a new prescription for 30 caps and to take it once daily.

## 2017-11-05 LAB — PAP IG, CT-NG NAA, HPV HIGH-RISK
C. TRACHOMATIS RNA, TMA: NOT DETECTED
HPV DNA High Risk: DETECTED — AB
N. GONORRHOEAE RNA, TMA: NOT DETECTED

## 2017-11-10 ENCOUNTER — Encounter: Payer: Self-pay | Admitting: Family Medicine

## 2017-11-10 ENCOUNTER — Ambulatory Visit (INDEPENDENT_AMBULATORY_CARE_PROVIDER_SITE_OTHER): Payer: 59 | Admitting: Family Medicine

## 2017-11-10 DIAGNOSIS — E8881 Metabolic syndrome: Secondary | ICD-10-CM | POA: Diagnosis not present

## 2017-11-10 DIAGNOSIS — F339 Major depressive disorder, recurrent, unspecified: Secondary | ICD-10-CM

## 2017-11-10 DIAGNOSIS — Z23 Encounter for immunization: Secondary | ICD-10-CM | POA: Diagnosis not present

## 2017-11-10 DIAGNOSIS — R87810 Cervical high risk human papillomavirus (HPV) DNA test positive: Secondary | ICD-10-CM | POA: Diagnosis not present

## 2017-11-10 DIAGNOSIS — R87610 Atypical squamous cells of undetermined significance on cytologic smear of cervix (ASC-US): Secondary | ICD-10-CM | POA: Diagnosis not present

## 2017-11-10 MED ORDER — LORCASERIN HCL ER 20 MG PO TB24: 1.0000 | ORAL_TABLET | Freq: Every day | ORAL | 2 refills | Status: DC
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
## 2017-11-10 NOTE — Patient Instructions (Addendum)
HPV Test The human papillomavirus (HPV) test is used to look for high-risk types of HPV infection. HPV is a group of about 100 viruses. Many of these viruses cause growths on, in, or around the genitals. Most HPV viruses cause infections that usually go away without treatment. However, HPV types 6, 11, 16, and 18 are considered high-risk types of HPV that can increase your risk of cancer of the cervix or anus if the infection is left untreated. An HPV test identifies the DNA (genetic) strands of the HPV infection, so it is also referred to as the HPV DNA test. Although HPV is found in both males and females, the HPV test is only used to screen for increased cancer risk in females:  With an abnormal Pap test.  After treatment of an abnormal Pap test.  Between the ages of 42 and 51.  After treatment of a high-risk HPV infection.  The HPV test may be done at the same time as a pelvic exam and Pap test in females over the age of 32. Both the HPV test and Pap test require a sample of cells from the cervix. How do I prepare for this test?  Do not douche or take a bath for 24-48 hours before the test or as directed by your health care provider.  Do not have sex for 24-48 hours before the test or as directed by your health care provider.  You may be asked to reschedule the test if you are menstruating.  You will be asked to urinate before the test. What do the results mean? It is your responsibility to obtain your test results. Ask the lab or department performing the test when and how you will get your results. Talk with your health care provider if you have any questions about your results. Your result will be negative or positive. Meaning of Negative Test Results A negative HPV test result means that no HPV was found, and it is very likely that you do not have HPV. Meaning of Positive Test Results A positive HPV test result indicates that you have HPV.  If your test result shows the presence  of any high-risk HPV strains, you may have an increased risk of developing cancer of the cervix or anus if the infection is left untreated.  If any low-risk HPV strains are found, you are not likely to have an increased risk of cancer.  Discuss your test results with your health care provider. He or she will use the results to make a diagnosis and determine a treatment plan that is right for you. Talk with your health care provider to discuss your results, treatment options, and if necessary, the need for more tests. Talk with your health care provider if you have any questions about your results. This information is not intended to replace advice given to you by your health care provider. Make sure you discuss any questions you have with your health care provider. Document Released: 10/04/2004 Document Revised: 05/15/2016 Document Reviewed: 01/25/2014 Elsevier Interactive Patient Education  2018 [employer] Calories are units of energy. Your body needs a certain amount of calories from food to keep you going throughout the day. When you eat more calories than your body needs, your body stores the extra calories as fat. When you eat fewer calories than your body needs, your body burns fat to get the energy it needs. Calorie counting means keeping track of how many calories you eat and drink  each day. Calorie counting can be helpful if you need to lose weight. If you make sure to eat fewer calories than your body needs, you should lose weight. Ask your health care provider what a healthy weight is for you. For calorie counting to work, you will need to eat the right number of calories in a day in order to lose a healthy amount of weight per week. A dietitian can help you determine how many calories you need in a day and will give you suggestions on how to reach your calorie goal.  A healthy amount of weight to lose per week is usually 1-2 lb (0.5-0.9 kg). This usually  means that your daily calorie intake should be reduced by 500-750 calories.  Eating 1,200 - 1,500 calories per day can help most women lose weight.  Eating 1,500 - 1,800 calories per day can help most men lose weight.  What is my plan? My goal is to have __________ calories per day. If I have this many calories per day, I should lose around __________ pounds per week. What do I need to know about calorie counting? In order to meet your daily calorie goal, you will need to:  Find out how many calories are in each food you would like to eat. Try to do this before you eat.  Decide how much of the food you plan to eat.  Write down what you ate and how many calories it had. Doing this is called keeping a food log.  To successfully lose weight, it is important to balance calorie counting with a healthy lifestyle that includes regular activity. Aim for 150 minutes of moderate exercise (such as walking) or 75 minutes of vigorous exercise (such as running) each week. Where do I find calorie information?  The number of calories in a food can be found on a Nutrition Facts label. If a food does not have a Nutrition Facts label, try to look up the calories online or ask your dietitian for help. Remember that calories are listed per serving. If you choose to have more than one serving of a food, you will have to multiply the calories per serving by the amount of servings you plan to eat. For example, the label on a package of bread might say that a serving size is 1 slice and that there are 90 calories in a serving. If you eat 1 slice, you will have eaten 90 calories. If you eat 2 slices, you will have eaten 180 calories. How do I keep a food log? Immediately after each meal, record the following information in your food log:  What you ate. Don't forget to include toppings, sauces, and other extras on the food.  How much you ate. This can be measured in cups, ounces, or number of items.  How many  calories each food and drink had.  The total number of calories in the meal.  Keep your food log near you, such as in a small notebook in your pocket, or use a mobile app or website. Some programs will calculate calories for you and show you how many calories you have left for the day to meet your goal. What are some calorie counting tips?  Use your calories on foods and drinks that will fill you up and not leave you hungry: ? Some examples of foods that fill you up are nuts and nut butters, vegetables, lean proteins, and high-fiber foods like whole grains. High-fiber foods are foods with more  than 5 g fiber per serving. ? Drinks such as sodas, specialty coffee drinks, alcohol, and juices have a lot of calories, yet do not fill you up.  Eat nutritious foods and avoid empty calories. Empty calories are calories you get from foods or beverages that do not have many vitamins or protein, such as candy, sweets, and soda. It is better to have a nutritious high-calorie food (such as an avocado) than a food with few nutrients (such as a bag of chips).  Know how many calories are in the foods you eat most often. This will help you calculate calorie counts faster.  Pay attention to calories in drinks. Low-calorie drinks include water and unsweetened drinks.  Pay attention to nutrition labels for "low fat" or "fat free" foods. These foods sometimes have the same amount of calories or more calories than the full fat versions. They also often have added sugar, starch, or salt, to make up for flavor that was removed with the fat.  Find a way of tracking calories that works for you. Get creative. Try different apps or programs if writing down calories does not work for you. What are some portion control tips?  Know how many calories are in a serving. This will help you know how many servings of a certain food you can have.  Use a measuring cup to measure serving sizes. You could also try weighing out  portions on a kitchen scale. With time, you will be able to estimate serving sizes for some foods.  Take some time to put servings of different foods on your favorite plates, bowls, and cups so you know what a serving looks like.  Try not to eat straight from a bag or box. Doing this can lead to overeating. Put the amount you would like to eat in a cup or on a plate to make sure you are eating the right portion.  Use smaller plates, glasses, and bowls to prevent overeating.  Try not to multitask (for example, watch TV or use your computer) while eating. If it is time to eat, sit down at a table and enjoy your food. This will help you to know when you are full. It will also help you to be aware of what you are eating and how much you are eating. What are tips for following this plan? Reading food labels  Check the calorie count compared to the serving size. The serving size may be smaller than what you are used to eating.  Check the source of the calories. Make sure the food you are eating is high in vitamins and protein and low in saturated and trans fats. Shopping  Read nutrition labels while you shop. This will help you make healthy decisions before you decide to purchase your food.  Make a grocery list and stick to it. Cooking  Try to cook your favorite foods in a healthier way. For example, try baking instead of frying.  Use low-fat dairy products. Meal planning  Use more fruits and vegetables. Half of your plate should be fruits and vegetables.  Include lean proteins like poultry and fish. How do I count calories when eating out?  Ask for smaller portion sizes.  Consider sharing an entree and sides instead of getting your own entree.  If you get your own entree, eat only half. Ask for a box at the beginning of your meal and put the rest of your entree in it so you are not tempted to eat it.  If calories are listed on the menu, choose the lower calorie options.  Choose  dishes that include vegetables, fruits, whole grains, low-fat dairy products, and lean protein.  Choose items that are boiled, broiled, grilled, or steamed. Stay away from items that are buttered, battered, fried, or served with cream sauce. Items labeled "crispy" are usually fried, unless stated otherwise.  Choose water, low-fat milk, unsweetened iced tea, or other drinks without added sugar. If you want an alcoholic beverage, choose a lower calorie option such as a glass of wine or light beer.  Ask for dressings, sauces, and syrups on the side. These are usually high in calories, so you should limit the amount you eat.  If you want a salad, choose a garden salad and ask for grilled meats. Avoid extra toppings like bacon, cheese, or fried items. Ask for the dressing on the side, or ask for olive oil and vinegar or lemon to use as dressing.  Estimate how many servings of a food you are given. For example, a serving of cooked rice is  cup or about the size of half a baseball. Knowing serving sizes will help you be aware of how much food you are eating at restaurants. The list below tells you how big or small some common portion sizes are based on everyday objects: ? 1 oz-4 stacked dice. ? 3 oz-1 deck of cards. ? 1 tsp-1 die. ? 1 Tbsp- a ping-pong ball. ? 2 Tbsp-1 ping-pong ball. ?  cup- baseball. ? 1 cup-1 baseball. Summary  Calorie counting means keeping track of how many calories you eat and drink each day. If you eat fewer calories than your body needs, you should lose weight.  A healthy amount of weight to lose per week is usually 1-2 lb (0.5-0.9 kg). This usually means reducing your daily calorie intake by 500-750 calories.  The number of calories in a food can be found on a Nutrition Facts label. If a food does not have a Nutrition Facts label, try to look up the calories online or ask your dietitian for help.  Use your calories on foods and drinks that will fill you up, and not on  foods and drinks that will leave you hungry.  Use smaller plates, glasses, and bowls to prevent overeating. This information is not intended to replace advice given to you by your health care provider. Make sure you discuss any questions you have with your health care provider. Document Released: 09/09/2005 Document Revised: 08/09/2016 Document Reviewed: 08/09/2016 Elsevier Interactive Patient Education  [employer].

## 2017-12-08 ENCOUNTER — Other Ambulatory Visit: Payer: Self-pay

## 2017-12-08 DIAGNOSIS — F339 Major depressive disorder, recurrent, unspecified: Secondary | ICD-10-CM

## 2017-12-08 NOTE — Telephone Encounter (Signed)
Is she taking two daily? I will need to change dose to 60 mg

## 2017-12-08 NOTE — Telephone Encounter (Signed)
Refill request for general medication: Duloxetine HCL DR 30 mg  Last office visit: 11/10/2017  Last physical exam: 10/31/2017  Follow-up on file. 02/11/2018

## 2017-12-09 NOTE — Telephone Encounter (Signed)
Patient called.  Unable to reach patient. If patient calls back please verify the message below from PCP.

## 2017-12-12 ENCOUNTER — Other Ambulatory Visit: Payer: Self-pay | Admitting: Family Medicine

## 2017-12-12 DIAGNOSIS — F339 Major depressive disorder, recurrent, unspecified: Secondary | ICD-10-CM

## 2017-12-12 MED ORDER — DULOXETINE HCL 60 MG PO CPEP: 60.0000 mg | ORAL_CAPSULE | Freq: Every day | ORAL | 1 refills | Status: DC

## 2017-12-12 NOTE — Addendum Note (Signed)
Addended by: Steele Sizer F on: 12/12/2017 01:39 PM   Modules accepted: Orders

## 2018-02-11 ENCOUNTER — Ambulatory Visit (INDEPENDENT_AMBULATORY_CARE_PROVIDER_SITE_OTHER): Payer: 59 | Admitting: Family Medicine

## 2018-02-11 ENCOUNTER — Encounter: Payer: Self-pay | Admitting: Family Medicine

## 2018-02-11 VITALS — BP 110/82 | HR 63 | Temp 98.5°F | Resp 16 | Ht 62.0 in | Wt 241.2 lb

## 2018-02-11 DIAGNOSIS — E559 Vitamin D deficiency, unspecified: Secondary | ICD-10-CM | POA: Diagnosis not present

## 2018-02-11 DIAGNOSIS — M5137 Other intervertebral disc degeneration, lumbosacral region: Secondary | ICD-10-CM | POA: Diagnosis not present

## 2018-02-11 DIAGNOSIS — F339 Major depressive disorder, recurrent, unspecified: Secondary | ICD-10-CM | POA: Diagnosis not present

## 2018-02-11 DIAGNOSIS — I10 Essential (primary) hypertension: Secondary | ICD-10-CM

## 2018-02-11 DIAGNOSIS — E8881 Metabolic syndrome: Secondary | ICD-10-CM | POA: Diagnosis not present

## 2018-02-11 MED ORDER — TRIAMTERENE-HCTZ 37.5-25 MG PO TABS: 1.0000 | ORAL_TABLET | Freq: Every day | ORAL | 1 refills | Status: DC

## 2018-02-11 MED ORDER — DICLOFENAC SODIUM 75 MG PO TBEC: 75.0000 mg | DELAYED_RELEASE_TABLET | Freq: Two times a day (BID) | ORAL | 0 refills | Status: DC | PRN
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
## 2018-03-31 ENCOUNTER — Ambulatory Visit: Payer: Self-pay | Admitting: Student in an Organized Health Care Education/Training Program

## 2018-04-06 ENCOUNTER — Ambulatory Visit
Payer: 59 | Attending: Student in an Organized Health Care Education/Training Program | Admitting: Student in an Organized Health Care Education/Training Program

## 2018-04-06 ENCOUNTER — Other Ambulatory Visit: Payer: Self-pay

## 2018-04-06 ENCOUNTER — Encounter: Payer: Self-pay | Admitting: Student in an Organized Health Care Education/Training Program

## 2018-04-06 VITALS — BP 162/75 | HR 62 | Temp 98.5°F | Resp 16 | Ht 63.0 in | Wt 248.0 lb

## 2018-04-06 DIAGNOSIS — G894 Chronic pain syndrome: Secondary | ICD-10-CM | POA: Diagnosis not present

## 2018-04-06 DIAGNOSIS — M722 Plantar fascial fibromatosis: Secondary | ICD-10-CM | POA: Diagnosis not present

## 2018-04-06 DIAGNOSIS — Z9049 Acquired absence of other specified parts of digestive tract: Secondary | ICD-10-CM | POA: Diagnosis not present

## 2018-04-06 DIAGNOSIS — Z9889 Other specified postprocedural states: Secondary | ICD-10-CM | POA: Insufficient documentation

## 2018-04-06 DIAGNOSIS — K219 Gastro-esophageal reflux disease without esophagitis: Secondary | ICD-10-CM | POA: Diagnosis not present

## 2018-04-06 DIAGNOSIS — Z79899 Other long term (current) drug therapy: Secondary | ICD-10-CM | POA: Diagnosis not present

## 2018-04-06 DIAGNOSIS — M4726 Other spondylosis with radiculopathy, lumbar region: Secondary | ICD-10-CM | POA: Insufficient documentation

## 2018-04-06 DIAGNOSIS — Z6841 Body Mass Index (BMI) 40.0 and over, adult: Secondary | ICD-10-CM | POA: Insufficient documentation

## 2018-04-06 DIAGNOSIS — M5137 Other intervertebral disc degeneration, lumbosacral region: Secondary | ICD-10-CM | POA: Insufficient documentation

## 2018-04-06 DIAGNOSIS — K429 Umbilical hernia without obstruction or gangrene: Secondary | ICD-10-CM | POA: Insufficient documentation

## 2018-04-06 DIAGNOSIS — Z8249 Family history of ischemic heart disease and other diseases of the circulatory system: Secondary | ICD-10-CM | POA: Insufficient documentation

## 2018-04-06 DIAGNOSIS — F419 Anxiety disorder, unspecified: Secondary | ICD-10-CM | POA: Insufficient documentation

## 2018-04-06 DIAGNOSIS — M47816 Spondylosis without myelopathy or radiculopathy, lumbar region: Secondary | ICD-10-CM | POA: Diagnosis not present

## 2018-04-06 DIAGNOSIS — Z888 Allergy status to other drugs, medicaments and biological substances status: Secondary | ICD-10-CM | POA: Diagnosis not present

## 2018-04-06 DIAGNOSIS — X58XXXA Exposure to other specified factors, initial encounter: Secondary | ICD-10-CM | POA: Diagnosis not present

## 2018-04-06 DIAGNOSIS — F4310 Post-traumatic stress disorder, unspecified: Secondary | ICD-10-CM | POA: Insufficient documentation

## 2018-04-06 DIAGNOSIS — F329 Major depressive disorder, single episode, unspecified: Secondary | ICD-10-CM | POA: Insufficient documentation

## 2018-04-06 DIAGNOSIS — J309 Allergic rhinitis, unspecified: Secondary | ICD-10-CM | POA: Diagnosis not present

## 2018-04-06 DIAGNOSIS — M5416 Radiculopathy, lumbar region: Secondary | ICD-10-CM

## 2018-04-06 DIAGNOSIS — E559 Vitamin D deficiency, unspecified: Secondary | ICD-10-CM | POA: Diagnosis not present

## 2018-04-06 DIAGNOSIS — E8881 Metabolic syndrome: Secondary | ICD-10-CM | POA: Insufficient documentation

## 2018-04-06 DIAGNOSIS — I10 Essential (primary) hypertension: Secondary | ICD-10-CM | POA: Diagnosis not present

## 2018-04-06 MED ORDER — TIZANIDINE HCL 4 MG PO TABS: 4.0000 mg | ORAL_TABLET | Freq: Three times a day (TID) | ORAL | 1 refills | Status: DC | PRN

## 2018-04-06 MED ORDER — TIZANIDINE HCL 4 MG PO TABS: 4.0000 mg | ORAL_TABLET | Freq: Three times a day (TID) | ORAL | 1 refills | Status: DC

## 2018-04-06 NOTE — Progress Notes (Signed)
Safety precautions to be maintained throughout the outpatient stay will include: orient to surroundings, keep bed in low position, maintain call bell within reach at all times, provide assistance with transfer out of bed and ambulation.  

## 2018-04-06 NOTE — Patient Instructions (Addendum)
Rx for Beryle Beams has been escribed to your pharmacy.  GENERAL RISKS AND COMPLICATIONS  What are the risk, side effects and possible complications? Generally speaking, most procedures are safe.  However, with any procedure there are risks, side effects, and the possibility of complications.  The risks and complications are dependent upon the sites that are lesioned, or the type of nerve block to be performed.  The closer the procedure is to the spine, the more serious the risks are.  Great care is taken when placing the radio frequency needles, block needles or lesioning probes, but sometimes complications can occur. 1. Infection: Any time there is an injection through the skin, there is a risk of infection.  This is why sterile conditions are used for these blocks.  There are four possible types of infection. 1. Localized skin infection. 2. Central Nervous System Infection-This can be in the form of Meningitis, which can be deadly. 3. Epidural Infections-This can be in the form of an epidural abscess, which can cause pressure inside of the spine, causing compression of the spinal cord with subsequent paralysis. This would require an emergency surgery to decompress, and there are no guarantees that the patient would recover from the paralysis. 4. Discitis-This is an infection of the intervertebral discs.  It occurs in about 1% of discography procedures.  It is difficult to treat and it may lead to surgery.        2. Pain: the needles have to go through skin and soft tissues, will cause soreness.       3. Damage to internal structures:  The nerves to be lesioned may be near blood vessels or    other nerves which can be potentially damaged.       4. Bleeding: Bleeding is more common if the patient is taking blood thinners such as  aspirin, Coumadin, Ticiid, Plavix, etc., or if he/she have some genetic predisposition  such as hemophilia. Bleeding into the spinal canal can cause compression of the spinal   cord with subsequent paralysis.  This would require an emergency surgery to  decompress and there are no guarantees that the patient would recover from the  paralysis.       5. Pneumothorax:  Puncturing of a lung is a possibility, every time a needle is introduced in  the area of the chest or upper back.  Pneumothorax refers to free air around the  collapsed lung(s), inside of the thoracic cavity (chest cavity).  Another two possible  complications related to a similar event would include: Hemothorax and Chylothorax.   These are variations of the Pneumothorax, where instead of air around the collapsed  lung(s), you may have blood or chyle, respectively.       6. Spinal headaches: They may occur with any procedures in the area of the spine.       7. Persistent CSF (Cerebro-Spinal Fluid) leakage: This is a rare problem, but may occur  with prolonged intrathecal or epidural catheters either due to the formation of a fistulous  track or a dural tear.       8. Nerve damage: By working so close to the spinal cord, there is always a possibility of  nerve damage, which could be as serious as a permanent spinal cord injury with  paralysis.       9. Death:  Although rare, severe deadly allergic reactions known as "Anaphylactic  reaction" can occur to any of the medications used.      10. Worsening  of the symptoms:  We can always make thing worse.  What are the chances of something like this happening? Chances of any of this occuring are extremely low.  By statistics, you have more of a chance of getting killed in a motor vehicle accident: while driving to the hospital than any of the above occurring .  Nevertheless, you should be aware that they are possibilities.  In general, it is similar to taking a shower.  Everybody knows that you can slip, hit your head and get killed.  Does that mean that you should not shower again?  Nevertheless always keep in mind that statistics do not mean anything if you happen to be on  the wrong side of them.  Even if a procedure has a 1 (one) in a 1,000,000 (million) chance of going wrong, it you happen to be that one..Also, keep in mind that by statistics, you have more of a chance of having something go wrong when taking medications.  Who should not have this procedure? If you are on a blood thinning medication (e.g. Coumadin, Plavix, see list of "Blood Thinners"), or if you have an active infection going on, you should not have the procedure.  If you are taking any blood thinners, please inform your physician.  How should I prepare for this procedure?  Do not eat or drink anything at least six hours prior to the procedure.  Bring a driver with you .  It cannot be a taxi.  Come accompanied by an adult that can drive you back, and that is strong enough to help you if your legs get weak or numb from the local anesthetic.  Take all of your medicines the morning of the procedure with just enough water to swallow them.  If you have diabetes, make sure that you are scheduled to have your procedure done first thing in the morning, whenever possible.  If you have diabetes, take only half of your insulin dose and notify our nurse that you have done so as soon as you arrive at the clinic.  If you are diabetic, but only take blood sugar pills (oral hypoglycemic), then do not take them on the morning of your procedure.  You may take them after you have had the procedure.  Do not take aspirin or any aspirin-containing medications, at least eleven (11) days prior to the procedure.  They may prolong bleeding.  Wear loose fitting clothing that may be easy to take off and that you would not mind if it got stained with Betadine or blood.  Do not wear any jewelry or perfume  Remove any nail coloring.  It will interfere with some of our monitoring equipment.  NOTE: Remember that this is not meant to be interpreted as a complete list of all possible complications.  Unforeseen problems  may occur.  BLOOD THINNERS The following drugs contain aspirin or other products, which can cause increased bleeding during surgery and should not be taken for 2 weeks prior to and 1 week after surgery.  If you should need take something for relief of minor pain, you may take acetaminophen which is found in Tylenol,m Datril, Anacin-3 and Panadol. It is not blood thinner. The products listed below are.  Do not take any of the products listed below in addition to any listed on your instruction sheet.  A.P.C or A.P.C with Codeine Codeine Phosphate Capsules #3 Ibuprofen Ridaura  ABC compound Congesprin Imuran rimadil  Advil Cope Indocin Robaxisal  Alka-Seltzer Effervescent  Pain Reliever and Antacid Coricidin or Coricidin-D  Indomethacin Rufen  Alka-Seltzer plus Cold Medicine Cosprin Ketoprofen S-A-C Tablets  Anacin Analgesic Tablets or Capsules Coumadin Korlgesic Salflex  Anacin Extra Strength Analgesic tablets or capsules CP-2 Tablets Lanoril Salicylate  Anaprox Cuprimine Capsules Levenox Salocol  Anexsia-D Dalteparin Magan Salsalate  Anodynos Darvon compound Magnesium Salicylate Sine-off  Ansaid Dasin Capsules Magsal Sodium Salicylate  Anturane Depen Capsules Marnal Soma  APF Arthritis pain formula Dewitt's Pills Measurin Stanback  Argesic Dia-Gesic Meclofenamic Sulfinpyrazone  Arthritis Bayer Timed Release Aspirin Diclofenac Meclomen Sulindac  Arthritis pain formula Anacin Dicumarol Medipren Supac  Analgesic (Safety coated) Arthralgen Diffunasal Mefanamic Suprofen  Arthritis Strength Bufferin Dihydrocodeine Mepro Compound Suprol  Arthropan liquid Dopirydamole Methcarbomol with Aspirin Synalgos  ASA tablets/Enseals Disalcid Micrainin Tagament  Ascriptin Doan's Midol Talwin  Ascriptin A/D Dolene Mobidin Tanderil  Ascriptin Extra Strength Dolobid Moblgesic Ticlid  Ascriptin with Codeine Doloprin or Doloprin with Codeine Momentum Tolectin  Asperbuf Duoprin Mono-gesic Trendar  Aspergum  Duradyne Motrin or Motrin IB Triminicin  Aspirin plain, buffered or enteric coated Durasal Myochrisine Trigesic  Aspirin Suppositories Easprin Nalfon Trillsate  Aspirin with Codeine Ecotrin Regular or Extra Strength Naprosyn Uracel  Atromid-S Efficin Naproxen Ursinus  Auranofin Capsules Elmiron Neocylate Vanquish  Axotal Emagrin Norgesic Verin  Azathioprine Empirin or Empirin with Codeine Normiflo Vitamin E  Azolid Emprazil Nuprin Voltaren  Bayer Aspirin plain, buffered or children's or timed BC Tablets or powders Encaprin Orgaran Warfarin Sodium  Buff-a-Comp Enoxaparin Orudis Zorpin  Buff-a-Comp with Codeine Equegesic Os-Cal-Gesic   Buffaprin Excedrin plain, buffered or Extra Strength Oxalid   Bufferin Arthritis Strength Feldene Oxphenbutazone   Bufferin plain or Extra Strength Feldene Capsules Oxycodone with Aspirin   Bufferin with Codeine Fenoprofen Fenoprofen Pabalate or Pabalate-SF   Buffets II Flogesic Panagesic   Buffinol plain or Extra Strength Florinal or Florinal with Codeine Panwarfarin   Buf-Tabs Flurbiprofen Penicillamine   Butalbital Compound Four-way cold tablets Penicillin   Butazolidin Fragmin Pepto-Bismol   Carbenicillin Geminisyn Percodan   Carna Arthritis Reliever Geopen Persantine   Carprofen Gold's salt Persistin   Chloramphenicol Goody's Phenylbutazone   Chloromycetin Haltrain Piroxlcam   Clmetidine heparin Plaquenil   Cllnoril Hyco-pap Ponstel   Clofibrate Hydroxy chloroquine Propoxyphen         Before stopping any of these medications, be sure to consult the physician who ordered them.  Some, such as Coumadin (Warfarin) are ordered to prevent or treat serious conditions such as "deep thrombosis", "pumonary embolisms", and other heart problems.  The amount of time that you may need off of the medication may also vary with the medication and the reason for which you were taking it.  If you are taking any of these medications, please make sure you notify your pain  physician before you undergo any procedures.    Epidural Steroid Injection Patient Information  Description: The epidural space surrounds the nerves as they exit the spinal cord.  In some patients, the nerves can be compressed and inflamed by a bulging disc or a tight spinal canal (spinal stenosis).  By injecting steroids into the epidural space, we can bring irritated nerves into direct contact with a potentially helpful medication.  These steroids act directly on the irritated nerves and can reduce swelling and inflammation which often leads to decreased pain.  Epidural steroids may be injected anywhere along the spine and from the neck to the low back depending upon the location of your pain.   After numbing the skin with local anesthetic (like  Novocaine), a small needle is passed into the epidural space slowly.  You may experience a sensation of pressure while this is being done.  The entire block usually last less than 10 minutes.  Conditions which may be treated by epidural steroids:   Low back and leg pain  Neck and arm pain  Spinal stenosis  Post-laminectomy syndrome  Herpes zoster (shingles) pain  Pain from compression fractures  Preparation for the injection:  1. Do not eat any solid food or dairy products within 8 hours of your appointment.  2. You may drink clear liquids up to 3 hours before appointment.  Clear liquids include water, black coffee, juice or soda.  No milk or cream please. 3. You may take your regular medication, including pain medications, with a sip of water before your appointment  Diabetics should hold regular insulin (if taken separately) and take 1/2 normal NPH dos the morning of the procedure.  Carry some sugar containing items with you to your appointment. 4. A driver must accompany you and be prepared to drive you home after your procedure.  5. Bring all your current medications with your. 6. An IV may be inserted and sedation may be given at the  discretion of the physician.   7. A blood pressure cuff, EKG and other monitors will often be applied during the procedure.  Some patients may need to have extra oxygen administered for a short period. 8. You will be asked to provide medical information, including your allergies, prior to the procedure.  We must know immediately if you are taking blood thinners (like Coumadin/Warfarin)  Or if you are allergic to IV iodine contrast (dye). We must know if you could possible be pregnant.  Possible side-effects:  Bleeding from needle site  Infection (rare, may require surgery)  Nerve injury (rare)  Numbness & tingling (temporary)  Difficulty urinating (rare, temporary)  Spinal headache ( a headache worse with upright posture)  Light -headedness (temporary)  Pain at injection site (several days)  Decreased blood pressure (temporary)  Weakness in arm/leg (temporary)  Pressure sensation in back/neck (temporary)  Call if you experience:  Fever/chills associated with headache or increased back/neck pain.  Headache worsened by an upright position.  New onset weakness or numbness of an extremity below the injection site  Hives or difficulty breathing (go to the emergency room)  Inflammation or drainage at the infection site  Severe back/neck pain  Any new symptoms which are concerning to you  Please note:  Although the local anesthetic injected can often make your back or neck feel good for several hours after the injection, the pain will likely return.  It takes 3-7 days for steroids to work in the epidural space.  You may not notice any pain relief for at least that one week.  If effective, we will often do a series of three injections spaced 3-6 weeks apart to maximally decrease your pain.  After the initial series, we generally will wait several months before considering a repeat injection of the same type.  If you have any questions, please call [phone] [facility]

## 2018-04-06 NOTE — Progress Notes (Signed)
Patient's Name: Cheyenne Walker  MRN: [id number]  Referring Provider: Steele Sizer, MD  DOB: 08/20/1971  PCP: Steele Sizer, MD  DOS: 04/06/2018  Note by: Gillis Santa, MD  Service setting: Ambulatory outpatient  Specialty: Interventional Pain Management  Location: [facility] (AMB) Pain Management Facility  Visit type: Initial Patient Evaluation  Patient type: New Patient   Primary Reason(s) for Visit: Encounter for initial evaluation of one or more chronic problems (new to examiner) potentially causing chronic pain, and posing a threat to normal musculoskeletal function. (Level of risk: High) CC: Back Pain (lower)  HPI  Ms. Venti is a 47 y.o. year old, female patient, who comes today to see [place] for the first time for an initial evaluation of her chronic pain. She has Anxiety; Benign essential HTN; Allergic rhinitis; Gastro-esophageal reflux disease without esophagitis; Major depressive episode; Morbid obesity with BMI of 40.0-44.9, adult ([place]); Dysmetabolic syndrome; Numerous moles; Plantar fasciitis; PTSD (post-traumatic stress disorder); Vitamin D deficiency; Umbilical hernia without obstruction and without gangrene; and DDD (degenerative disc disease), lumbosacral on their problem list. Today she comes in for evaluation of her Back Pain (lower)  Pain Assessment: Location: Lower Back Radiating: denies Onset: More than a month ago Duration: Chronic pain Quality: Constant, Aching, Nagging(worse in the morning) Severity: 8 /10 (subjective, self-reported pain score)  Note: Reported level is inconsistent with clinical observations.                         When using our objective Pain Scale, levels between 6 and 10/10 are said to belong in an emergency room, as it progressively worsens from a 6/10, described as severely limiting, requiring emergency care not usually available at an outpatient pain management facility. At a 6/10 level, communication becomes difficult and requires great effort. Assistance  to reach the emergency department may be required. Facial flushing and profuse sweating along with potentially dangerous increases in heart rate and blood pressure will be evident. Effect on ADL: it takes a long time to dress in the morning; bending is very difficult Timing: Constant Modifying factors: medications, heat, warm showers BP: (!) 162/75  HR: 62  Onset and Duration: Present longer than 3 months Cause of pain: Unknown Severity: Getting worse, No change since onset and NAS-11 on the average: 10/10 Timing: Morning and Night Aggravating Factors: Bending and Squatting Alleviating Factors: Standing Associated Problems: Night-time cramps, Depression, Fatigue, Inability to concentrate, Numbness, Personality changes, Sadness, Suicidal ideations, Tingling, Weakness, Pain that wakes patient up and Pain that does not allow patient to sleep Quality of Pain: Aching, Agonizing, Annoying, Distressing, Dreadful, Exhausting, Horrible, Nagging, Sharp, Stabbing, Tiring and Uncomfortable Previous Examinations or Tests: MRI scan Previous Treatments: The patient denies -none noted  The patient comes into the clinics today for the first time for a chronic pain management evaluation.  47 year old female with a history of morbid obesity who presents with axial low back pain that radiates into bilateral hips, bilateral groin and occasionally into her thighs and in her feet.  This is secondary to lumbar radiculopathy secondary to lumbar disc herniation, lumbar degenerative disc disease. She has been monitoring her diet as well as exercising as much as she can.  Patient has seen [facility] and seems to have had what is described as a lumbar epidural steroid injection which provided her with pain relief for approximately 4 to 5 days.  Her pain is persistent.  She denies any lower extremity weakness or bowel or bladder dysfunction.  Patient does have a history of depression with high PHQ 9 scores.  She had tried  Cymbalta in the past which was not very effective for her pain or her depression symptoms.  Current medications include diclofenac 75 mill grams twice daily, gabapentin 300 mg nightly.  Patient does endorse axial low back muscle spasms.  Today I took the time to provide the patient with information regarding my pain practice. The patient was informed that my practice is divided into two sections: an interventional pain management section, as well as a completely separate and distinct medication management section. I explained that I have procedure days for my interventional therapies, and evaluation days for follow-ups and medication management. Because of the amount of documentation required during both, they are kept separated. This means that there is the possibility that she may be scheduled for a procedure on one day, and medication management the next. I have also informed her that because of staffing and facility limitations, I no longer take patients for medication management only. To illustrate the reasons for this, I gave the patient the example of surgeons, and how inappropriate it would be to refer a patient to his/her care, just to write for the post-surgical antibiotics on a surgery done by a different surgeon.   Because interventional pain management is my board-certified specialty, the patient was informed that joining my practice means that they are open to any and all interventional therapies. I made it clear that this does not mean that they will be forced to have any procedures done. What this means is that I believe interventional therapies to be essential part of the diagnosis and proper management of chronic pain conditions. Therefore, patients not interested in these interventional alternatives will be better served under the care of a different practitioner.  The patient was also made aware of my Comprehensive Pain Management Safety Guidelines where by joining my practice, they limit  all of their nerve blocks and joint injections to those done by our practice, for as long as we are retained to manage their care.   Historic Controlled Substance Pharmacotherapy Review  PMP and historical list of controlled substances: Tramadol 50 mg, quantity 60, last fill 03/20/2017, MME equals 10.  Not on chronic opioid therapy Medications: The patient did not bring the medication(s) to the appointment, as requested in our "New Patient Package" Pharmacodynamics: Desired effects: Analgesia: The patient reports 50% benefit. Reported improvement in function: The patient reports medication allows her to accomplish basic ADLs. Clinically meaningful improvement in function (CMIF): Sustained CMIF goals met Perceived effectiveness: Described as relatively effective but with some room for improvement Undesirable effects: Side-effects or Adverse reactions: None reported Historical Monitoring: The patient  reports that she does not use drugs. List of all UDS Test(s): No results found for: MDMA, COCAINSCRNUR, [place], [place], CANNABQUANT, [place], [place] List of other Serum/Urine Drug Screening Test(s):  No results found for: AMPHSCRSER, BARBSCRSER, BENZOSCRSER, COCAINSCRSER, COCAINSCRNUR, PCPSCRSER, PCPQUANT, THCSCRSER, THCU, CANNABQUANT, OPIATESCRSER, OXYSCRSER, PROPOXSCRSER, ETH Historical Background Evaluation: [place] PMP: Six (6) year initial data search conducted.             [place] Department of public safety, offender search: Editor, commissioning Information) Non-contributory Risk Assessment Profile: Aberrant behavior: None observed or detected today Risk factors for fatal opioid overdose: None identified today Fatal overdose hazard ratio (HR): Calculation deferred Non-fatal overdose hazard ratio (HR): Calculation deferred Risk of opioid abuse or dependence: 0.7-3.0% with doses ? 36 MME/day and 6.1-26% with doses ? 120 MME/day. Substance use  disorder (SUD) risk level: Low Opioid risk tool (ORT) (Total Score):  1 Opioid Risk Tool - 04/06/18 1357      Family History of Substance Abuse   Alcohol  Negative    Illegal Drugs  Negative    Rx Drugs  Negative      Personal History of Substance Abuse   Alcohol  Negative    Illegal Drugs  Negative    Rx Drugs  Negative      Age   Age between 31-45 years   Yes      History of Preadolescent Sexual Abuse   History of Preadolescent Sexual Abuse  Negative or Female      Psychological Disease   Psychological Disease  Negative    Depression  Negative      Total Score   Opioid Risk Tool Scoring  1    Opioid Risk Interpretation  Low Risk      ORT Scoring interpretation table:  Score <3 = Low Risk for SUD  Score between 4-7 = Moderate Risk for SUD  Score >8 = High Risk for Opioid Abuse   PHQ-2 Depression Scale:  Total score: 0  PHQ-2 Scoring interpretation table: (Score and probability of major depressive disorder)  Score 0 = No depression  Score 1 = 15.4% Probability  Score 2 = 21.1% Probability  Score 3 = 38.4% Probability  Score 4 = 45.5% Probability  Score 5 = 56.4% Probability  Score 6 = 78.6% Probability   PHQ-9 Depression Scale:  Total score: 0  PHQ-9 Scoring interpretation table:  Score 0-4 = No depression  Score 5-9 = Mild depression  Score 10-14 = Moderate depression  Score 15-19 = Moderately severe depression  Score 20-27 = Severe depression (2.4 times higher risk of SUD and 2.89 times higher risk of overuse)   Pharmacologic Plan: As per protocol, I have not taken over any controlled substance management, pending the results of ordered tests and/or consults.            Initial impression: Pending review of available data and ordered tests.  Meds   Current Outpatient Medications:  .  diclofenac (VOLTAREN) 75 MG EC tablet, Take 1 tablet (75 mg total) by mouth 2 (two) times daily as needed., Disp: 60 tablet, Rfl: 0 .  gabapentin (NEURONTIN) 300 MG capsule, Take 1 capsule by mouth at bedtime., Disp: , Rfl: 11 .   triamterene-hydrochlorothiazide (MAXZIDE-25) 37.5-25 MG tablet, Take 1 tablet by mouth daily., Disp: 90 tablet, Rfl: 1 .  fluticasone (FLONASE) 50 MCG/ACT nasal spray, SPRAY 2 SPRAYS (100 MCG) IN EACH NOSTRIL BY INTRANASAL ROUTE ONCE DAILY, Disp: , Rfl: 4 .  tiZANidine (ZANAFLEX) 4 MG tablet, Take 1 tablet (4 mg total) by mouth every 8 (eight) hours as needed for muscle spasms., Disp: 90 tablet, Rfl: 1  Imaging Review  Lumbosacral Imaging: Lumbar MR wo contrast:  Results for orders placed during the hospital encounter of 01/24/17  MR LUMBAR SPINE WO CONTRAST   Narrative CLINICAL DATA:  47 year old female with 6 months of lumbar back pain radiating down the left leg with numbness. Symptoms increase at night. No known injury.  EXAM: MRI LUMBAR SPINE WITHOUT CONTRAST  TECHNIQUE: Multiplanar, multisequence MR imaging of the lumbar spine was performed. No intravenous contrast was administered.  COMPARISON:  CT Abdomen and Pelvis 10/28/2014  FINDINGS: Segmentation:  Normal is demonstrated on the comparison CT.  Alignment: Stable since 2016. Subtle retrolisthesis of L5 on S1. Mild straightening of upper lumbar lordosis.  Vertebrae: Degenerative mild endplate marrow edema at L5-S1. Elsewhere bone marrow signal is within normal limits. No other acute osseous abnormality identified. Negative visible sacrum.  Conus medullaris: Extends to the T12-L1 level and appears normal.  Paraspinal and other soft tissues: Negative.  Disc levels:  T11-T12:  Mild facet hypertrophy.  T12-L1: Mild disc desiccation. Mild circumferential disc bulge with superimposed small broad-based central disc protrusion. No significant stenosis.  L1-L2: Disc desiccation. Mild but anterior eccentric circumferential disc bulge. Mild endplate spurring. Mild facet hypertrophy. Borderline to mild bilateral L1 foraminal stenosis.  L2-L3: Mild disc desiccation. Mild circumferential disc bulge with subtle superimposed  central disc protrusion with annular fissure (series 5, image 17). No stenosis.  L3-L4:  Mild facet hypertrophy.  L4-L5: Mild disc desiccation. Small broad-based central disc protrusion with subtle annular fissure. Borderline to mild facet hypertrophy. No stenosis.  L5-S1: Chronic disc space loss and vacuum disc. Circumferential disc osteophyte complex with broad-based posterior component. Mild facet hypertrophy. Moderate left and mild right L5 foraminal stenosis appears chronic and stable.  IMPRESSION: 1. Lumbar disc degeneration except at L3-L4. Intermittent small disc herniations, but no lumbar spinal stenosis. 2. Chronic disc and acute on chronic endplate degeneration at L5-S1 contributes to moderate left and mild right L5 neural foraminal stenosis which appears stable since the 2016 CT Abdomen and Pelvis. Query left L5 radiculitis.   Electronically Signed   By: Genevie Ann M.D.   On: 01/24/2017 15:58   Complexity Note: Imaging results reviewed. Results shared with Ms. Ezelle, using Layman's terms.                         ROS  Cardiovascular: High blood pressure Pulmonary or Respiratory: No reported pulmonary signs or symptoms such as wheezing and difficulty taking a deep full breath (Asthma), difficulty blowing air out (Emphysema), coughing up mucus (Bronchitis), persistent dry cough, or temporary stoppage of breathing during sleep Neurological: No reported neurological signs or symptoms such as seizures, abnormal skin sensations, urinary and/or fecal incontinence, being born with an abnormal open spine and/or a tethered spinal cord Review of Past Neurological Studies: No results found for this or any previous visit. Psychological-Psychiatric: Depressed Gastrointestinal: Heartburn due to stomach pushing into lungs (Hiatal hernia) Genitourinary: No reported renal or genitourinary signs or symptoms such as difficulty voiding or producing urine, peeing blood, non-functioning kidney,  kidney stones, difficulty emptying the bladder, difficulty controlling the flow of urine, or chronic kidney disease Hematological: No reported hematological signs or symptoms such as prolonged bleeding, low or poor functioning platelets, bruising or bleeding easily, hereditary bleeding problems, low energy levels due to low hemoglobin or being anemic Endocrine: No reported endocrine signs or symptoms such as high or low blood sugar, rapid heart rate due to high thyroid levels, obesity or weight gain due to slow thyroid or thyroid disease Rheumatologic: No reported rheumatological signs and symptoms such as fatigue, joint pain, tenderness, swelling, redness, heat, stiffness, decreased range of motion, with or without associated rash Musculoskeletal: Negative for myasthenia gravis, muscular dystrophy, multiple sclerosis or malignant hyperthermia Work History: Working full time  Allergies  Ms. Goren is allergic to ace inhibitors.  Laboratory Chemistry  Inflammation Markers (CRP: Acute Phase) (ESR: Chronic Phase) No results found for: CRP, ESRSEDRATE, LATICACIDVEN                       Rheumatology Markers No results found for: RF, ANA, LABURIC, [place], [place], LYMEABIGMQN, HLAB27  Renal Function Markers Lab Results  Component Value Date   BUN 20 10/31/2017   CREATININE 0.84 54/27/0623   BCR NOT APPLICABLE 76/28/3151   GFRAA >60 10/15/2016   GFRNONAA >60 10/15/2016                             Hepatic Function Markers Lab Results  Component Value Date   AST 22 10/31/2017   ALT 25 10/31/2017   ALBUMIN 3.8 10/28/2014   ALKPHOS 62 10/28/2014                        Electrolytes Lab Results  Component Value Date   NA 140 10/31/2017   K 3.7 10/31/2017   CL 104 10/31/2017   CALCIUM 9.6 10/31/2017                        Neuropathy Markers Lab Results  Component Value Date   VITAMINB12 418 10/31/2017   HGBA1C 5.0 10/31/2017   HIV NONREACTIVE 01/06/2017                         Bone Pathology Markers Lab Results  Component Value Date   VD25OH 20 (L) 10/31/2017                         Coagulation Parameters Lab Results  Component Value Date   PLT 380 10/31/2017                        Cardiovascular Markers Lab Results  Component Value Date   TROPONINI <0.03 10/15/2016   HGB 13.7 10/31/2017   HCT 41.1 10/31/2017                         CA Markers No results found for: CEA, CA125, LABCA2                      Note: Lab results reviewed.  PFSH  Drug: Ms. Gielow  reports that she does not use drugs. Alcohol:  reports that she does not drink alcohol. Tobacco:  reports that she has never smoked. She has never used smokeless tobacco. Medical:  has a past medical history of Hypertension. Family: family history includes Hypertension in her brother, mother, and sister.  Past Surgical History:  Procedure Laterality Date  . CHOLECYSTECTOMY  2006  . HERNIA REPAIR  10/2014   Active Ambulatory Problems    Diagnosis Date Noted  . Anxiety 04/03/2015  . Benign essential HTN 04/03/2015  . Allergic rhinitis 04/03/2015  . Gastro-esophageal reflux disease without esophagitis 04/03/2015  . Major depressive episode 04/03/2015  . Morbid obesity with BMI of 40.0-44.9, adult ([place]) 04/03/2015  . Dysmetabolic syndrome 76/16/0737  . Numerous moles 04/03/2015  . Plantar fasciitis 04/03/2015  . PTSD (post-traumatic stress disorder) 04/03/2015  . Vitamin D deficiency 04/03/2015  . Umbilical hernia without obstruction and without gangrene 04/03/2015  . DDD (degenerative disc disease), lumbosacral 05/01/2017   Resolved Ambulatory Problems    Diagnosis Date Noted  . Excessive and frequent menstruation with irregular cycle 12/21/2014   Past Medical History:  Diagnosis Date  . Hypertension    Constitutional Exam  General appearance: alert, cooperative and morbidly obese   Vitals:   04/06/18 1348  BP: (!) 162/75  Pulse: 62  Resp: 16   Temp: 98.5 F (36.9 C)  TempSrc: Oral  SpO2: 100%  Weight: 248 lb (112.5 kg)  Height: '5\' 3"'$  (1.6 m)   BMI Assessment: Estimated body mass index is 43.93 kg/m as calculated from the following:   Height as of this encounter: '5\' 3"'$  (1.6 m).   Weight as of this encounter: 248 lb (112.5 kg).  BMI interpretation table: BMI level Category Range association with higher incidence of chronic pain  <18 kg/m2 Underweight   18.5-24.9 kg/m2 Ideal body weight   25-29.9 kg/m2 Overweight Increased incidence by 20%  30-34.9 kg/m2 Obese (Class I) Increased incidence by 68%  35-39.9 kg/m2 Severe obesity (Class II) Increased incidence by 136%  >40 kg/m2 Extreme obesity (Class III) Increased incidence by 254%   Patient's current BMI Ideal Body weight  Body mass index is 43.93 kg/m. Ideal body weight: 52.4 kg (115 lb 8.3 oz) Adjusted ideal body weight: 76.4 kg (168 lb 8.2 oz)   BMI Readings from Last 4 Encounters:  04/06/18 43.93 kg/m  02/11/18 44.12 kg/m  11/10/17 44.85 kg/m  10/31/17 44.28 kg/m   Wt Readings from Last 4 Encounters:  04/06/18 248 lb (112.5 kg)  02/11/18 241 lb 3.2 oz (109.4 kg)  11/10/17 245 lb 3.2 oz (111.2 kg)  10/31/17 242 lb 1.6 oz (109.8 kg)  Psych/Mental status: Alert, oriented x 3 (person, place, & time)       Eyes: PERLA Respiratory: No evidence of acute respiratory distress  Cervical Spine Area Exam  Skin & Axial Inspection: No masses, redness, edema, swelling, or associated skin lesions Alignment: Symmetrical Functional ROM: Unrestricted ROM      Stability: No instability detected Muscle Tone/Strength: Functionally intact. No obvious neuro-muscular anomalies detected. Sensory (Neurological): Unimpaired Palpation: No palpable anomalies              Upper Extremity (UE) Exam    Side: Right upper extremity  Side: Left upper extremity  Skin & Extremity Inspection: Skin color, temperature, and hair growth are WNL. No peripheral edema or cyanosis. No masses,  redness, swelling, asymmetry, or associated skin lesions. No contractures.  Skin & Extremity Inspection: Skin color, temperature, and hair growth are WNL. No peripheral edema or cyanosis. No masses, redness, swelling, asymmetry, or associated skin lesions. No contractures.  Functional ROM: Unrestricted ROM          Functional ROM: Unrestricted ROM          Muscle Tone/Strength: Functionally intact. No obvious neuro-muscular anomalies detected.  Muscle Tone/Strength: Functionally intact. No obvious neuro-muscular anomalies detected.  Sensory (Neurological): Unimpaired          Sensory (Neurological): Unimpaired          Palpation: No palpable anomalies              Palpation: No palpable anomalies              Provocative Test(s):  Phalen's test: deferred Tinel's test: deferred Apley's scratch test (touch opposite shoulder):  Action 1 (Across chest): deferred Action 2 (Overhead): deferred Action 3 (LB reach): deferred   Provocative Test(s):  Phalen's test: deferred Tinel's test: deferred Apley's scratch test (touch opposite shoulder):  Action 1 (Across chest): deferred Action 2 (Overhead): deferred Action 3 (LB reach): deferred    Thoracic Spine Area Exam  Skin & Axial Inspection: No masses, redness, or swelling Alignment: Symmetrical Functional ROM: Unrestricted ROM Stability: No instability detected Muscle Tone/Strength: Functionally intact. No obvious neuro-muscular anomalies detected. Sensory (Neurological): Unimpaired Muscle strength &  Tone: No palpable anomalies  Lumbar Spine Area Exam  Skin & Axial Inspection: No masses, redness, or swelling Alignment: Symmetrical Functional ROM: Decreased ROM       Stability: No instability detected Muscle Tone/Strength: Functionally intact. No obvious neuro-muscular anomalies detected. Sensory (Neurological): Dermatomal pain pattern and musculoskeletal Palpation: No palpable anomalies       Provocative Tests: Lumbar  Hyperextension/rotation test: (+) bilaterally for facet joint pain. Lumbar quadrant test (Kemp's test): deferred today       Lumbar Lateral bending test: (+) ipsilateral radicular pain, bilaterally. Positive for bilateral foraminal stenosis. Patrick's Maneuver: deferred today                   FABER test: deferred today                   Thigh-thrust test: deferred today       S-I compression test: deferred today       S-I distraction test: deferred today        Gait & Posture Assessment  Ambulation: Unassisted Gait: Relatively normal for age and body habitus Posture: WNL   Lower Extremity Exam    Side: Right lower extremity  Side: Left lower extremity  Stability: No instability observed          Stability: No instability observed          Skin & Extremity Inspection: Skin color, temperature, and hair growth are WNL. No peripheral edema or cyanosis. No masses, redness, swelling, asymmetry, or associated skin lesions. No contractures.  Skin & Extremity Inspection: Skin color, temperature, and hair growth are WNL. No peripheral edema or cyanosis. No masses, redness, swelling, asymmetry, or associated skin lesions. No contractures.  Functional ROM: Unrestricted ROM                  Functional ROM: Unrestricted ROM                  Muscle Tone/Strength: Functionally intact. No obvious neuro-muscular anomalies detected.  Muscle Tone/Strength: Functionally intact. No obvious neuro-muscular anomalies detected.  Sensory (Neurological): Unimpaired  Sensory (Neurological): Unimpaired  Palpation: No palpable anomalies  Palpation: No palpable anomalies   Assessment  Primary Diagnosis & Pertinent Problem List: The primary encounter diagnosis was Lumbar radiculopathy (L5). Diagnoses of Degeneration of lumbar or lumbosacral intervertebral disc, Lumbar spondylosis, Morbid obesity ([place]), and Chronic pain syndrome were also pertinent to this visit.  Visit Diagnosis (New problems to examiner): 1. Lumbar  radiculopathy (L5)   2. Degeneration of lumbar or lumbosacral intervertebral disc   3. Lumbar spondylosis   4. Morbid obesity ([place])   5. Chronic pain syndrome   General Recommendations: The pain condition that the patient suffers from is best treated with a multidisciplinary approach that involves an increase in physical activity to prevent de-conditioning and worsening of the pain cycle, as well as psychological counseling (formal and/or informal) to address the co-morbid psychological affects of pain. Treatment will often involve judicious use of pain medications and interventional procedures to decrease the pain, allowing the patient to participate in the physical activity that will ultimately produce long-lasting pain reductions. The goal of the multidisciplinary approach is to return the patient to a higher level of overall function and to restore their ability to perform activities of daily living.  47 year old female with a history of morbid obesity that presents with axial low back pain that radiates into her left greater than right hip and left greater than right groin that  is been present for greater than 6 months secondary to chronic lumbar degenerative disc disease most pronounced at L5-S1 contributing to moderate left and mild right L5 neuroforaminal stenosis and possible bilateral L5 radiculitis.  Patient's last lumbar epidural steroid injection was in May 2019 which the patient states provided her with 5 days of complete pain relief.  She states that she may have exerted herself thereafter which may have led her to have return of her axial low back pain.  Patient denies any obvious lower extreme any weakness or bowel or bladder dysfunction.  We discussed repeating lumbar epidural steroid injection at L5-S1.  Regards to medication management, given that the patient is having lumbar paraspinal muscle spasms, I recommended tizanidine 4 mg twice daily to 3 times daily as needed to help with her  muscle spasm symptoms.  Otherwise the patient can continue taking her diclofenac and gabapentin that is prescribed.  Plan: -Prescription for tizanidine as below -Continue gabapentin and diclofenac as prescribed next  -plan for L5-S1 ESI #1 for lumbar radiculopathy  Future considerations: Bilateral SI joint injection, lumbar facet medial branch nerve blocks  Note: Please be advised that as per protocol, today's visit has been an evaluation only. We have not taken over the patient's controlled substance management.  Ordered Lab-work, Procedure(s), Referral(s), & Consult(s): Orders Placed This Encounter  Procedures  . Lumbar Epidural Injection  . Compliance Drug Analysis, Ur   Pharmacotherapy (current): Medications ordered:  Meds ordered this encounter  Medications  . DISCONTD: tiZANidine (ZANAFLEX) 4 MG tablet    Sig: Take 1 tablet (4 mg total) by mouth 3 (three) times daily.    Dispense:  90 tablet    Refill:  1  . tiZANidine (ZANAFLEX) 4 MG tablet    Sig: Take 1 tablet (4 mg total) by mouth every 8 (eight) hours as needed for muscle spasms.    Dispense:  90 tablet    Refill:  1   Medications administered during this visit: Laren T. Pentland had no medications administered during this visit.   Pharmacological management options:  Opioid Analgesics: The patient was informed that there is no guarantee that she would be a candidate for opioid analgesics. The decision will be made following CDC guidelines. This decision will be based on the results of diagnostic studies, as well as Ms. Foote's risk profile.   Membrane stabilizer: To be determined at a later time  Muscle relaxant: To be determined at a later time  NSAID: To be determined at a later time  Other analgesic(s): To be determined at a later time   Interventional management options: Ms. Grosshans was informed that there is no guarantee that she would be a candidate for interventional therapies. The decision will be based on  the results of diagnostic studies, as well as Ms. Hollingworth's risk profile.  Procedure(s) under consideration:  L5-S1 ESI Bilateral SI joint injection Lumbar facet medial branch nerve blocks   Provider-requested follow-up: Return in about 2 weeks (around 04/20/2018) for Procedure.  No future appointments.  Primary Care Physician: Steele Sizer, MD Location: [facility] [place] [facility] Note by: Gillis Santa, M.D, Date: 04/06/2018; Time: 4:28 PM  Patient Instructions   Rx for Beryle Beams has been escribed to your pharmacy.  GENERAL RISKS AND COMPLICATIONS  What are the risk, side effects and possible complications? Generally speaking, most procedures are safe.  However, with any procedure there are risks, side effects, and the possibility of complications.  The risks and complications are dependent upon the  sites that are lesioned, or the type of nerve block to be performed.  The closer the procedure is to the spine, the more serious the risks are.  Great care is taken when placing the radio frequency needles, block needles or lesioning probes, but sometimes complications can occur. 1. Infection: Any time there is an injection through the skin, there is a risk of infection.  This is why sterile conditions are used for these blocks.  There are four possible types of infection. 1. Localized skin infection. 2. Central Nervous System Infection-This can be in the form of Meningitis, which can be deadly. 3. Epidural Infections-This can be in the form of an epidural abscess, which can cause pressure inside of the spine, causing compression of the spinal cord with subsequent paralysis. This would require an emergency surgery to decompress, and there are no guarantees that the patient would recover from the paralysis. 4. Discitis-This is an infection of the intervertebral discs.  It occurs in about 1% of discography procedures.  It is difficult to treat and it may lead to surgery.         2. Pain: the needles have to go through skin and soft tissues, will cause soreness.       3. Damage to internal structures:  The nerves to be lesioned may be near blood vessels or    other nerves which can be potentially damaged.       4. Bleeding: Bleeding is more common if the patient is taking blood thinners such as  aspirin, Coumadin, Ticiid, Plavix, etc., or if he/she have some genetic predisposition  such as hemophilia. Bleeding into the spinal canal can cause compression of the spinal  cord with subsequent paralysis.  This would require an emergency surgery to  decompress and there are no guarantees that the patient would recover from the  paralysis.       5. Pneumothorax:  Puncturing of a lung is a possibility, every time a needle is introduced in  the area of the chest or upper back.  Pneumothorax refers to free air around the  collapsed lung(s), inside of the thoracic cavity (chest cavity).  Another two possible  complications related to a similar event would include: Hemothorax and Chylothorax.   These are variations of the Pneumothorax, where instead of air around the collapsed  lung(s), you may have blood or chyle, respectively.       6. Spinal headaches: They may occur with any procedures in the area of the spine.       7. Persistent CSF (Cerebro-Spinal Fluid) leakage: This is a rare problem, but may occur  with prolonged intrathecal or epidural catheters either due to the formation of a fistulous  track or a dural tear.       8. Nerve damage: By working so close to the spinal cord, there is always a possibility of  nerve damage, which could be as serious as a permanent spinal cord injury with  paralysis.       9. Death:  Although rare, severe deadly allergic reactions known as "Anaphylactic  reaction" can occur to any of the medications used.      10. Worsening of the symptoms:  We can always make thing worse.  What are the chances of something like this happening? Chances of any of this  occuring are extremely low.  By statistics, you have more of a chance of getting killed in a motor vehicle accident: while driving to the hospital than any  of the above occurring .  Nevertheless, you should be aware that they are possibilities.  In general, it is similar to taking a shower.  Everybody knows that you can slip, hit your head and get killed.  Does that mean that you should not shower again?  Nevertheless always keep in mind that statistics do not mean anything if you happen to be on the wrong side of them.  Even if a procedure has a 1 (one) in a 1,000,000 (million) chance of going wrong, it you happen to be that one..Also, keep in mind that by statistics, you have more of a chance of having something go wrong when taking medications.  Who should not have this procedure? If you are on a blood thinning medication (e.g. Coumadin, Plavix, see list of "Blood Thinners"), or if you have an active infection going on, you should not have the procedure.  If you are taking any blood thinners, please inform your physician.  How should I prepare for this procedure?  Do not eat or drink anything at least six hours prior to the procedure.  Bring a driver with you .  It cannot be a taxi.  Come accompanied by an adult that can drive you back, and that is strong enough to help you if your legs get weak or numb from the local anesthetic.  Take all of your medicines the morning of the procedure with just enough water to swallow them.  If you have diabetes, make sure that you are scheduled to have your procedure done first thing in the morning, whenever possible.  If you have diabetes, take only half of your insulin dose and notify our nurse that you have done so as soon as you arrive at the clinic.  If you are diabetic, but only take blood sugar pills (oral hypoglycemic), then do not take them on the morning of your procedure.  You may take them after you have had the procedure.  Do not take aspirin  or any aspirin-containing medications, at least eleven (11) days prior to the procedure.  They may prolong bleeding.  Wear loose fitting clothing that may be easy to take off and that you would not mind if it got stained with Betadine or blood.  Do not wear any jewelry or perfume  Remove any nail coloring.  It will interfere with some of our monitoring equipment.  NOTE: Remember that this is not meant to be interpreted as a complete list of all possible complications.  Unforeseen problems may occur.  BLOOD THINNERS The following drugs contain aspirin or other products, which can cause increased bleeding during surgery and should not be taken for 2 weeks prior to and 1 week after surgery.  If you should need take something for relief of minor pain, you may take acetaminophen which is found in Tylenol,m Datril, Anacin-3 and Panadol. It is not blood thinner. The products listed below are.  Do not take any of the products listed below in addition to any listed on your instruction sheet.  A.P.C or A.P.C with Codeine Codeine Phosphate Capsules #3 Ibuprofen Ridaura  ABC compound Congesprin Imuran rimadil  Advil Cope Indocin Robaxisal  Alka-Seltzer Effervescent Pain Reliever and Antacid Coricidin or Coricidin-D  Indomethacin Rufen  Alka-Seltzer plus Cold Medicine Cosprin Ketoprofen S-A-C Tablets  Anacin Analgesic Tablets or Capsules Coumadin Korlgesic Salflex  Anacin Extra Strength Analgesic tablets or capsules CP-2 Tablets Lanoril Salicylate  Anaprox Cuprimine Capsules Levenox Salocol  Anexsia-D Dalteparin Magan Salsalate  Anodynos Darvon  compound Magnesium Salicylate Sine-off  Ansaid Dasin Capsules Magsal Sodium Salicylate  Anturane Depen Capsules Marnal Soma  APF Arthritis pain formula Dewitt's Pills Measurin Stanback  Argesic Dia-Gesic Meclofenamic Sulfinpyrazone  Arthritis Bayer Timed Release Aspirin Diclofenac Meclomen Sulindac  Arthritis pain formula Anacin Dicumarol Medipren Supac   Analgesic (Safety coated) Arthralgen Diffunasal Mefanamic Suprofen  Arthritis Strength Bufferin Dihydrocodeine Mepro Compound Suprol  Arthropan liquid Dopirydamole Methcarbomol with Aspirin Synalgos  ASA tablets/Enseals Disalcid Micrainin Tagament  Ascriptin Doan's Midol Talwin  Ascriptin A/D Dolene Mobidin Tanderil  Ascriptin Extra Strength Dolobid Moblgesic Ticlid  Ascriptin with Codeine Doloprin or Doloprin with Codeine Momentum Tolectin  Asperbuf Duoprin Mono-gesic Trendar  Aspergum Duradyne Motrin or Motrin IB Triminicin  Aspirin plain, buffered or enteric coated Durasal Myochrisine Trigesic  Aspirin Suppositories Easprin Nalfon Trillsate  Aspirin with Codeine Ecotrin Regular or Extra Strength Naprosyn Uracel  Atromid-S Efficin Naproxen Ursinus  Auranofin Capsules Elmiron Neocylate Vanquish  Axotal Emagrin Norgesic Verin  Azathioprine Empirin or Empirin with Codeine Normiflo Vitamin E  Azolid Emprazil Nuprin Voltaren  Bayer Aspirin plain, buffered or children's or timed BC Tablets or powders Encaprin Orgaran Warfarin Sodium  Buff-a-Comp Enoxaparin Orudis Zorpin  Buff-a-Comp with Codeine Equegesic Os-Cal-Gesic   Buffaprin Excedrin plain, buffered or Extra Strength Oxalid   Bufferin Arthritis Strength Feldene Oxphenbutazone   Bufferin plain or Extra Strength Feldene Capsules Oxycodone with Aspirin   Bufferin with Codeine Fenoprofen Fenoprofen Pabalate or Pabalate-SF   Buffets II Flogesic Panagesic   Buffinol plain or Extra Strength Florinal or Florinal with Codeine Panwarfarin   Buf-Tabs Flurbiprofen Penicillamine   Butalbital Compound Four-way cold tablets Penicillin   Butazolidin Fragmin Pepto-Bismol   Carbenicillin Geminisyn Percodan   Carna Arthritis Reliever Geopen Persantine   Carprofen Gold's salt Persistin   Chloramphenicol Goody's Phenylbutazone   Chloromycetin Haltrain Piroxlcam   Clmetidine heparin Plaquenil   Cllnoril Hyco-pap Ponstel   Clofibrate Hydroxy  chloroquine Propoxyphen         Before stopping any of these medications, be sure to consult the physician who ordered them.  Some, such as Coumadin (Warfarin) are ordered to prevent or treat serious conditions such as "deep thrombosis", "pumonary embolisms", and other heart problems.  The amount of time that you may need off of the medication may also vary with the medication and the reason for which you were taking it.  If you are taking any of these medications, please make sure you notify your pain physician before you undergo any procedures.    Epidural Steroid Injection Patient Information  Description: The epidural space surrounds the nerves as they exit the spinal cord.  In some patients, the nerves can be compressed and inflamed by a bulging disc or a tight spinal canal (spinal stenosis).  By injecting steroids into the epidural space, we can bring irritated nerves into direct contact with a potentially helpful medication.  These steroids act directly on the irritated nerves and can reduce swelling and inflammation which often leads to decreased pain.  Epidural steroids may be injected anywhere along the spine and from the neck to the low back depending upon the location of your pain.   After numbing the skin with local anesthetic (like Novocaine), a small needle is passed into the epidural space slowly.  You may experience a sensation of pressure while this is being done.  The entire block usually last less than 10 minutes.  Conditions which may be treated by epidural steroids:   Low back and leg pain  Neck and arm  pain  Spinal stenosis  Post-laminectomy syndrome  Herpes zoster (shingles) pain  Pain from compression fractures  Preparation for the injection:  1. Do not eat any solid food or dairy products within 8 hours of your appointment.  2. You may drink clear liquids up to 3 hours before appointment.  Clear liquids include water, black coffee, juice or soda.  No milk or  cream please. 3. You may take your regular medication, including pain medications, with a sip of water before your appointment  Diabetics should hold regular insulin (if taken separately) and take 1/2 normal NPH dos the morning of the procedure.  Carry some sugar containing items with you to your appointment. 4. A driver must accompany you and be prepared to drive you home after your procedure.  5. Bring all your current medications with your. 6. An IV may be inserted and sedation may be given at the discretion of the physician.   7. A blood pressure cuff, EKG and other monitors will often be applied during the procedure.  Some patients may need to have extra oxygen administered for a short period. 8. You will be asked to provide medical information, including your allergies, prior to the procedure.  We must know immediately if you are taking blood thinners (like Coumadin/Warfarin)  Or if you are allergic to IV iodine contrast (dye). We must know if you could possible be pregnant.  Possible side-effects:  Bleeding from needle site  Infection (rare, may require surgery)  Nerve injury (rare)  Numbness & tingling (temporary)  Difficulty urinating (rare, temporary)  Spinal headache ( a headache worse with upright posture)  Light -headedness (temporary)  Pain at injection site (several days)  Decreased blood pressure (temporary)  Weakness in arm/leg (temporary)  Pressure sensation in back/neck (temporary)  Call if you experience:  Fever/chills associated with headache or increased back/neck pain.  Headache worsened by an upright position.  New onset weakness or numbness of an extremity below the injection site  Hives or difficulty breathing (go to the emergency room)  Inflammation or drainage at the infection site  Severe back/neck pain  Any new symptoms which are concerning to you  Please note:  Although the local anesthetic injected can often make your back or neck feel  good for several hours after the injection, the pain will likely return.  It takes 3-7 days for steroids to work in the epidural space.  You may not notice any pain relief for at least that one week.  If effective, we will often do a series of three injections spaced 3-6 weeks apart to maximally decrease your pain.  After the initial series, we generally will wait several months before considering a repeat injection of the same type.  If you have any questions, please call [phone] [facility]

## 2018-04-09 LAB — COMPLIANCE DRUG ANALYSIS, UR

## 2018-04-15 ENCOUNTER — Encounter: Payer: Self-pay | Admitting: Student in an Organized Health Care Education/Training Program

## 2018-04-15 ENCOUNTER — Ambulatory Visit (HOSPITAL_BASED_OUTPATIENT_CLINIC_OR_DEPARTMENT_OTHER): Payer: 59 | Admitting: Student in an Organized Health Care Education/Training Program

## 2018-04-15 ENCOUNTER — Ambulatory Visit
Admission: RE | Admit: 2018-04-15 | Discharge: 2018-04-15 | Disposition: A | Discharge status: Home / Self Care | Payer: 59 | Source: Ambulatory Visit | Attending: Student in an Organized Health Care Education/Training Program | Admitting: Student in an Organized Health Care Education/Training Program

## 2018-04-15 ENCOUNTER — Other Ambulatory Visit: Payer: Self-pay

## 2018-04-15 DIAGNOSIS — M545 Low back pain: Secondary | ICD-10-CM | POA: Diagnosis present

## 2018-04-15 DIAGNOSIS — M5416 Radiculopathy, lumbar region: Secondary | ICD-10-CM

## 2018-04-15 MED ORDER — ROPIVACAINE HCL 2 MG/ML IJ SOLN
2.0000 mL | Freq: Once | INTRAMUSCULAR | Status: AC
  Administered 2018-04-15: 10 mL via EPIDURAL
  Filled 2018-04-15: qty 10

## 2018-04-15 MED ORDER — SODIUM CHLORIDE 0.9% FLUSH
2.0000 mL | Freq: Once | INTRAVENOUS | Status: AC
  Administered 2018-04-15: 10 mL

## 2018-04-15 MED ORDER — IOPAMIDOL (ISOVUE-M 200) INJECTION 41%
10.0000 mL | Freq: Once | INTRAMUSCULAR | Status: AC
  Administered 2018-04-15: 10 mL via EPIDURAL
  Filled 2018-04-15: qty 10

## 2018-04-15 MED ORDER — FENTANYL CITRATE (PF) 100 MCG/2ML IJ SOLN
25.0000 ug | INTRAMUSCULAR | Status: DC | PRN
  Administered 2018-04-15: 50 ug via INTRAVENOUS
  Filled 2018-04-15: qty 2

## 2018-04-15 MED ORDER — LIDOCAINE HCL 2 % IJ SOLN
10.0000 mL | Freq: Once | INTRAMUSCULAR | Status: AC
  Administered 2018-04-15: 400 mg
  Filled 2018-04-15: qty 20

## 2018-04-15 MED ORDER — LACTATED RINGERS IV SOLN
1000.0000 mL | Freq: Once | INTRAVENOUS | Status: AC
  Administered 2018-04-15: 1000 mL via INTRAVENOUS

## 2018-04-15 MED ORDER — DEXAMETHASONE SODIUM PHOSPHATE 10 MG/ML IJ SOLN
10.0000 mg | Freq: Once | INTRAMUSCULAR | Status: AC
  Administered 2018-04-15: 10 mg
  Filled 2018-04-15: qty 1

## 2018-04-15 NOTE — Patient Instructions (Signed)

## 2018-04-15 NOTE — Progress Notes (Signed)
Patient's Name: Cheyenne Walker  MRN: [id number]  Referring Provider: Gillis Santa, MD  DOB: [birth year]  PCP: Steele Sizer, MD  DOS: 04/15/2018  Note by: Gillis Santa, MD  Service setting: Ambulatory outpatient  Specialty: Interventional Pain Management  Patient type: Established  Location: [facility] (AMB) Pain Management Facility  Visit type: Interventional Procedure   Primary Reason for Visit: Interventional Pain Management Treatment. CC: Back Pain (lower)  Procedure:          Anesthesia, Analgesia, Anxiolysis:  Type: Therapeutic Inter-Laminar Epidural Steroid Injection #1  Region: Lumbar Level: L5-S1 Level. Laterality: Midline         Type: Moderate (Conscious) Sedation combined with Local Anesthesia Indication(s): Analgesia and Anxiety Route: Intravenous (IV) IV Access: Secured Sedation: Meaningful verbal contact was maintained at all times during the procedure  Local Anesthetic: Lidocaine 2%   Indications: 1. Lumbar radiculopathy (L5)    Pain Score: Pre-procedure: 8 /10 Post-procedure: 3 /10  Pre-op Assessment:  Cheyenne Walker is a 47 y.o. (year old), female patient, seen today for interventional treatment. She  has a past surgical history that includes Hernia repair (10/2014) and Cholecystectomy (2006). Ms. Filion has a current medication list which includes the following prescription(s): diclofenac, fluticasone, gabapentin, tizanidine, and triamterene-hydrochlorothiazide, and the following Facility-Administered Medications: fentanyl and lactated ringers. Her primarily concern today is the Back Pain (lower)  Initial Vital Signs:  Pulse/HCG Rate: (!) 51ECG Heart Rate: (!) 56 Temp: 98 F (36.7 C) Resp: 16 BP: (!) 153/95 SpO2: 100 %  BMI: Estimated body mass index is 42.51 kg/m as calculated from the following:   Height as of this encounter: 5\' 3"  (1.6 m).   Weight as of this encounter: 240 lb (108.9 kg).  Risk Assessment: Allergies: Reviewed. She is allergic to ace inhibitors.   Allergy Precautions: None required Coagulopathies: Reviewed. None identified.  Blood-thinner therapy: None at this time Active Infection(s): Reviewed. None identified. Cheyenne Walker is afebrile  Site Confirmation: Ms. Standard was asked to confirm the procedure and laterality before marking the site Procedure checklist: Completed Consent: Before the procedure and under the influence of no sedative(s), amnesic(s), or anxiolytics, the patient was informed of the treatment options, risks and possible complications. To fulfill our ethical and legal obligations, as recommended by the American Medical Association's Code of Ethics, I have informed the patient of my clinical impression; the nature and purpose of the treatment or procedure; the risks, benefits, and possible complications of the intervention; the alternatives, including doing nothing; the risk(s) and benefit(s) of the alternative treatment(s) or procedure(s); and the risk(s) and benefit(s) of doing nothing. The patient was provided information about the general risks and possible complications associated with the procedure. These may include, but are not limited to: failure to achieve desired goals, infection, bleeding, organ or nerve damage, allergic reactions, paralysis, and death. In addition, the patient was informed of those risks and complications associated to Spine-related procedures, such as failure to decrease pain; infection (i.e.: Meningitis, epidural or intraspinal abscess); bleeding (i.e.: epidural hematoma, subarachnoid hemorrhage, or any other type of intraspinal or peri-dural bleeding); organ or nerve damage (i.e.: Any type of peripheral nerve, nerve root, or spinal cord injury) with subsequent damage to sensory, motor, and/or autonomic systems, resulting in permanent pain, numbness, and/or weakness of one or several areas of the body; allergic reactions; (i.e.: anaphylactic reaction); and/or death. Furthermore, the patient was informed  of those risks and complications associated with the medications. These include, but are not limited to: allergic reactions (i.e.: anaphylactic  or anaphylactoid reaction(s)); adrenal axis suppression; blood sugar elevation that in diabetics may result in ketoacidosis or comma; water retention that in patients with history of congestive heart failure may result in shortness of breath, pulmonary edema, and decompensation with resultant heart failure; weight gain; swelling or edema; medication-induced neural toxicity; particulate matter embolism and blood vessel occlusion with resultant organ, and/or nervous system infarction; and/or aseptic necrosis of one or more joints. Finally, the patient was informed that Medicine is not an exact science; therefore, there is also the possibility of unforeseen or unpredictable risks and/or possible complications that may result in a catastrophic outcome. The patient indicated having understood very clearly. We have given the patient no guarantees and we have made no promises. Enough time was given to the patient to ask questions, all of which were answered to the patient's satisfaction. Cheyenne Walker has indicated that she wanted to continue with the procedure. Attestation: I, the ordering provider, attest that I have discussed with the patient the benefits, risks, side-effects, alternatives, likelihood of achieving goals, and potential problems during recovery for the procedure that I have provided informed consent. Date  Time: 04/15/2018  9:43 AM  Pre-Procedure Preparation:  Monitoring: As per clinic protocol. Respiration, ETCO2, SpO2, BP, heart rate and rhythm monitor placed and checked for adequate function Safety Precautions: Patient was assessed for positional comfort and pressure points before starting the procedure. Time-out: I initiated and conducted the "Time-out" before starting the procedure, as per protocol. The patient was asked to participate by confirming the  accuracy of the "Time Out" information. Verification of the correct person, site, and procedure were performed and confirmed by me, the nursing staff, and the patient. "Time-out" conducted as per Joint Commission's Universal Protocol (UP.01.01.01). Time: 1052  Description of Procedure:          Position: Prone with head of the table was raised to facilitate breathing. Target Area: The interlaminar space, initially targeting the lower laminar border of the superior vertebral body. Approach: Paramedial approach. Area Prepped: Entire Posterior Lumbar Region Prepping solution: ChloraPrep (2% chlorhexidine gluconate and 70% isopropyl alcohol) Safety Precautions: Aspiration looking for blood return was conducted prior to all injections. At no point did we inject any substances, as a needle was being advanced. No attempts were made at seeking any paresthesias. Safe injection practices and needle disposal techniques used. Medications properly checked for expiration dates. SDV (single dose vial) medications used. Description of the Procedure: Protocol guidelines were followed. The procedure needle was introduced through the skin, ipsilateral to the reported pain, and advanced to the target area. Bone was contacted and the needle walked caudad, until the lamina was cleared. The epidural space was identified using "loss-of-resistance technique" with 2-3 ml of PF-NaCl (0.9% NSS), in a 5cc LOR glass syringe. Vitals:   04/15/18 1100 04/15/18 1109 04/15/18 1121 04/15/18 1129  BP: (!) 140/95 117/90 116/72 111/76  Pulse:      Resp: 14 13 15 15   Temp:    (!) 97 F (36.1 C)  TempSrc:      SpO2: 95% 100% 100% 99%  Weight:      Height:        Start Time: 1052 hrs. End Time: 1059 hrs. Materials:  Needle(s) Type: Epidural needle Gauge: 17G Length: 3.5-in Medication(s): Please see orders for medications and dosing details. 9 cc solution containing 6 cc of preservative-free saline, 2 cc of 0.2% ropivacaine, 1  cc of Decadron 10 mg/cc Imaging Guidance (Spinal):  Type of Imaging Technique: Fluoroscopy Guidance (Spinal) Indication(s): Assistance in needle guidance and placement for procedures requiring needle placement in or near specific anatomical locations not easily accessible without such assistance. Exposure Time: Please see nurses notes. Contrast: Before injecting any contrast, we confirmed that the patient did not have an allergy to iodine, shellfish, or radiological contrast. Once satisfactory needle placement was completed at the desired level, radiological contrast was injected. Contrast injected under live fluoroscopy. No contrast complications. See chart for type and volume of contrast used. Fluoroscopic Guidance: I was personally present during the use of fluoroscopy. "Tunnel Vision Technique" used to obtain the best possible view of the target area. Parallax error corrected before commencing the procedure. "Direction-depth-direction" technique used to introduce the needle under continuous pulsed fluoroscopy. Once target was reached, antero-posterior, oblique, and lateral fluoroscopic projection used confirm needle placement in all planes. Images permanently stored in EMR. Interpretation: I personally interpreted the imaging intraoperatively. Adequate needle placement confirmed in multiple planes. Appropriate spread of contrast into desired area was observed. No evidence of afferent or efferent intravascular uptake. No intrathecal or subarachnoid spread observed. Permanent images saved into the patient's record.  Antibiotic Prophylaxis:   Anti-infectives (From admission, onward)   None     Indication(s): None identified  Post-operative Assessment:  Post-procedure Vital Signs:  Pulse/HCG Rate: (!) 51(!) 51 Temp: (!) 97 F (36.1 C) Resp: 15 BP: 111/76 SpO2: 99 %  EBL: None  Complications: No immediate post-treatment complications observed by team, or reported by  patient.  Note: The patient tolerated the entire procedure well. A repeat set of vitals were taken after the procedure and the patient was kept under observation following institutional policy, for this type of procedure. Post-procedural neurological assessment was performed, showing return to baseline, prior to discharge. The patient was provided with post-procedure discharge instructions, including a section on how to identify potential problems. Should any problems arise concerning this procedure, the patient was given instructions to immediately contact us, at any time, without hesitation. In any case, we plan to contact the patient by telephone for a follow-up status report regarding this interventional procedure.  Comments:  No additional relevant information. 5 out of 5 strength bilateral lower extremity: Plantar flexion, dorsiflexion, knee flexion, knee extension.  Plan of Care    Imaging Orders     DG C-Arm 1-60 Min-No Report Procedure Orders    No procedure(s) ordered today    Medications ordered for procedure: Meds ordered this encounter  Medications  . lactated ringers infusion 1,000 mL  . fentaNYL (SUBLIMAZE) injection 25-100 mcg    Make sure Narcan is available in the pyxis when using this medication. In the event of respiratory depression (RR< 8/min): Titrate NARCAN (naloxone) in increments of 0.1 to 0.2 mg IV at 2-3 minute intervals, until desired degree of reversal.  . iopamidol (ISOVUE-M) 41 % intrathecal injection 10 mL  . ropivacaine (PF) 2 mg/mL (0.2%) (NAROPIN) injection 2 mL  . sodium chloride flush (NS) 0.9 % injection 2 mL  . lidocaine (XYLOCAINE) 2 % (with pres) injection 200 mg  . dexamethasone (DECADRON) injection 10 mg   Medications administered: We administered lactated ringers, fentaNYL, iopamidol, ropivacaine (PF) 2 mg/mL (0.2%), sodium chloride flush, lidocaine, and dexamethasone.  See the medical record for exact dosing, route, and time of  administration.  New Prescriptions   No medications on file   Disposition: Discharge home  Discharge Date & Time: 04/15/2018; 1130 hrs.   Physician-requested Follow-up: Return in about 3 weeks (around  05/06/2018) for Post Procedure Evaluation.  Future Appointments  Date Time Provider [place]  05/07/2018 12:15 PM Gillis Santa, MD [facility] None   Primary Care Physician: Steele Sizer, MD Location: [facility] Outpatient Pain Management Facility Note by: Gillis Santa, MD Date: 04/15/2018; Time: 11:37 AM  Disclaimer:  Medicine is not an exact science. The only guarantee in medicine is that nothing is guaranteed. It is important to note that the decision to proceed with this intervention was based on the information collected from the patient. The Data and conclusions were drawn from the patient's questionnaire, the interview, and the physical examination. Because the information was provided in large part by the patient, it cannot be guaranteed that it has not been purposely or unconsciously manipulated. Every effort has been made to obtain as much relevant data as possible for this evaluation. It is important to note that the conclusions that lead to this procedure are derived in large part from the available data. Always take into account that the treatment will also be dependent on availability of resources and existing treatment guidelines, considered by other Pain Management Practitioners as being common knowledge and practice, at the time of the intervention. For Medico-Legal purposes, it is also important to point out that variation in procedural techniques and pharmacological choices are the acceptable norm. The indications, contraindications, technique, and results of the above procedure should only be interpreted and judged by a Board-Certified Interventional Pain Specialist with extensive familiarity and expertise in the same exact procedure and technique.

## 2018-04-16 ENCOUNTER — Telehealth: Payer: Self-pay

## 2018-04-16 NOTE — Telephone Encounter (Signed)
Post procedure phone call, No answering service

## 2018-05-07 ENCOUNTER — Other Ambulatory Visit: Payer: Self-pay

## 2018-05-07 ENCOUNTER — Ambulatory Visit
Payer: 59 | Attending: Student in an Organized Health Care Education/Training Program | Admitting: Student in an Organized Health Care Education/Training Program

## 2018-05-07 ENCOUNTER — Encounter: Payer: Self-pay | Admitting: Student in an Organized Health Care Education/Training Program

## 2018-05-07 VITALS — BP 155/97 | HR 64 | Temp 98.6°F | Resp 16 | Ht 64.0 in | Wt 260.0 lb

## 2018-05-07 DIAGNOSIS — I10 Essential (primary) hypertension: Secondary | ICD-10-CM | POA: Insufficient documentation

## 2018-05-07 DIAGNOSIS — G894 Chronic pain syndrome: Secondary | ICD-10-CM | POA: Insufficient documentation

## 2018-05-07 DIAGNOSIS — Z6841 Body Mass Index (BMI) 40.0 and over, adult: Secondary | ICD-10-CM | POA: Insufficient documentation

## 2018-05-07 DIAGNOSIS — Z9049 Acquired absence of other specified parts of digestive tract: Secondary | ICD-10-CM | POA: Diagnosis not present

## 2018-05-07 DIAGNOSIS — E559 Vitamin D deficiency, unspecified: Secondary | ICD-10-CM | POA: Insufficient documentation

## 2018-05-07 DIAGNOSIS — Z79899 Other long term (current) drug therapy: Secondary | ICD-10-CM | POA: Insufficient documentation

## 2018-05-07 DIAGNOSIS — M4726 Other spondylosis with radiculopathy, lumbar region: Secondary | ICD-10-CM | POA: Diagnosis not present

## 2018-05-07 DIAGNOSIS — F329 Major depressive disorder, single episode, unspecified: Secondary | ICD-10-CM | POA: Insufficient documentation

## 2018-05-07 DIAGNOSIS — M5137 Other intervertebral disc degeneration, lumbosacral region: Secondary | ICD-10-CM | POA: Diagnosis not present

## 2018-05-07 DIAGNOSIS — F4310 Post-traumatic stress disorder, unspecified: Secondary | ICD-10-CM | POA: Insufficient documentation

## 2018-05-07 DIAGNOSIS — F419 Anxiety disorder, unspecified: Secondary | ICD-10-CM | POA: Insufficient documentation

## 2018-05-07 DIAGNOSIS — Z7951 Long term (current) use of inhaled steroids: Secondary | ICD-10-CM | POA: Diagnosis not present

## 2018-05-07 DIAGNOSIS — M5416 Radiculopathy, lumbar region: Secondary | ICD-10-CM

## 2018-05-07 DIAGNOSIS — K219 Gastro-esophageal reflux disease without esophagitis: Secondary | ICD-10-CM | POA: Diagnosis not present

## 2018-05-07 DIAGNOSIS — M47816 Spondylosis without myelopathy or radiculopathy, lumbar region: Secondary | ICD-10-CM

## 2018-05-07 DIAGNOSIS — Z8249 Family history of ischemic heart disease and other diseases of the circulatory system: Secondary | ICD-10-CM | POA: Diagnosis not present

## 2018-05-07 DIAGNOSIS — M5116 Intervertebral disc disorders with radiculopathy, lumbar region: Secondary | ICD-10-CM | POA: Insufficient documentation

## 2018-05-07 DIAGNOSIS — Z888 Allergy status to other drugs, medicaments and biological substances status: Secondary | ICD-10-CM | POA: Insufficient documentation

## 2018-05-07 DIAGNOSIS — M545 Low back pain: Secondary | ICD-10-CM | POA: Diagnosis present

## 2018-05-07 NOTE — Progress Notes (Signed)
Patient's Name: Cheyenne Walker  MRN: [id number]  Referring Provider: Steele Sizer, MD  DOB: 1971/08/17  PCP: Steele Sizer, MD  DOS: 05/07/2018  Note by: Gillis Santa, MD  Service setting: Ambulatory outpatient  Specialty: Interventional Pain Management  Location: [facility] (AMB) Pain Management Facility    Patient type: Established   Primary Reason(s) for Visit: Encounter for post-procedure evaluation of chronic illness with mild to moderate exacerbation CC: Back Pain (low)  HPI  [name] is a 47 y.o. year old, female patient, who comes today for a post-procedure evaluation. She has Anxiety; Benign essential HTN; Allergic rhinitis; Gastro-esophageal reflux disease without esophagitis; Major depressive episode; Morbid obesity with BMI of 40.0-44.9, adult ([place]); Dysmetabolic syndrome; Numerous moles; Plantar fasciitis; PTSD (post-traumatic stress disorder); Vitamin D deficiency; Umbilical hernia without obstruction and without gangrene; and DDD (degenerative disc disease), lumbosacral on their problem list. Her primarily concern today is the Back Pain (low)  Pain Assessment: Location: Lower Back Radiating: denies Onset: More than a month ago Duration: Chronic pain Quality: Sore(stiffness in the mornings) Severity: 4 /10 (subjective, self-reported pain score)  Note: Reported level is compatible with observation.                         When using our objective Pain Scale, levels between 6 and 10/10 are said to belong in an emergency room, as it progressively worsens from a 6/10, described as severely limiting, requiring emergency care not usually available at an outpatient pain management facility. At a 6/10 level, communication becomes difficult and requires great effort. Assistance to reach the emergency department may be required. Facial flushing and profuse sweating along with potentially dangerous increases in heart rate and blood pressure will be evident. Effect on ADL:   Timing:  Constant Modifying factors: medications, heat, lesi BP: (!) 155/97  HR: 64  [name] comes in today for post-procedure evaluation after the treatment done on 04/16/2018.  Further details on both, my assessment(s), as well as the proposed treatment plan, please see below.  Post-Procedure Assessment  04/15/2018 Procedure: L5/S1 ESI Pre-procedure pain score:  8/10 Post-procedure pain score: 3/10         Influential Factors: BMI: 44.63 kg/m Intra-procedural challenges: None observed.         Assessment challenges: None detected.              Reported side-effects: None.        Post-procedural adverse reactions or complications: None reported         Sedation: Please see nurses note. When no sedatives are used, the analgesic levels obtained are directly associated to the effectiveness of the local anesthetics. However, when sedation is provided, the level of analgesia obtained during the initial 1 hour following the intervention, is believed to be the result of a combination of factors. These factors may include, but are not limited to: 1. The effectiveness of the local anesthetics used. 2. The effects of the analgesic(s) and/or anxiolytic(s) used. 3. The degree of discomfort experienced by the patient at the time of the procedure. 4. The patients ability and reliability in recalling and recording the events. 5. The presence and influence of possible secondary gains and/or psychosocial factors. Reported result: Relief experienced during the 1st hour after the procedure: 70 % (Ultra-Short Term Relief)            Interpretative annotation: Clinically appropriate result. Analgesia during this period is likely to be Local Anesthetic and/or IV Sedative (Analgesic/Anxiolytic)  related.          Effects of local anesthetic: The analgesic effects attained during this period are directly associated to the localized infiltration of local anesthetics and therefore cary significant diagnostic value as to  the etiological location, or anatomical origin, of the pain. Expected duration of relief is directly dependent on the pharmacodynamics of the local anesthetic used. Long-acting (4-6 hours) anesthetics used.  Reported result: Relief during the next 4 to 6 hour after the procedure: 70 % (Short-Term Relief)            Interpretative annotation: Clinically appropriate result. Analgesia during this period is likely to be Local Anesthetic-related.          Long-term benefit: Defined as the period of time past the expected duration of local anesthetics (1 hour for short-acting and 4-6 hours for long-acting). With the possible exception of prolonged sympathetic blockade from the local anesthetics, benefits during this period are typically attributed to, or associated with, other factors such as analgesic sensory neuropraxia, antiinflammatory effects, or beneficial biochemical changes provided by agents other than the local anesthetics.  Reported result: Extended relief following procedure: 50 % (Long-Term Relief)            Interpretative annotation: Clinically possible results. Good relief. No permanent benefit expected. Inflammation plays a part in the etiology to the pain.          Current benefits: Defined as reported results that persistent at this point in time.   Analgesia: 50-75 %            Function: Somewhat improved ROM: Somewhat improved Interpretative annotation: Ongoing benefit. Therapeutic benefit observed. Effective diagnostic intervention.          Interpretation: Results would suggest a successful diagnostic and therapeutic intervention.                  Plan:  Set up procedure as a PRN palliative treatment option for this patient.                Laboratory Chemistry  Inflammation Markers (CRP: Acute Phase) (ESR: Chronic Phase) No results found for: CRP, ESRSEDRATE, LATICACIDVEN                       Rheumatology Markers No results found for: RF, ANA, LABURIC, URICUR, LYMEIGGIGMAB,  LYMEABIGMQN, HLAB27                      Renal Function Markers Lab Results  Component Value Date   BUN 20 10/31/2017   CREATININE 0.84 85/63/1497   BCR NOT APPLICABLE 02/63/7858   GFRAA >60 10/15/2016   GFRNONAA >60 10/15/2016                             Hepatic Function Markers Lab Results  Component Value Date   AST 22 10/31/2017   ALT 25 10/31/2017   ALBUMIN 3.8 10/28/2014   ALKPHOS 62 10/28/2014                        Electrolytes Lab Results  Component Value Date   NA 140 10/31/2017   K 3.7 10/31/2017   CL 104 10/31/2017   CALCIUM 9.6 10/31/2017                        Neuropathy Markers Lab Results  Component Value Date  VITAMINB12 418 10/31/2017   HGBA1C 5.0 10/31/2017   HIV NONREACTIVE 01/06/2017                        Bone Pathology Markers Lab Results  Component Value Date   VD25OH 20 (L) 10/31/2017                         Coagulation Parameters Lab Results  Component Value Date   PLT 380 10/31/2017                        Cardiovascular Markers Lab Results  Component Value Date   TROPONINI <0.03 10/15/2016   HGB 13.7 10/31/2017   HCT 41.1 10/31/2017                         CA Markers No results found for: CEA, CA125, LABCA2                      Note: Lab results reviewed.  Recent Diagnostic Imaging Results  DG C-Arm 1-60 Min-No Report Fluoroscopy was utilized by the requesting physician.  No radiographic  interpretation.   Complexity Note: Imaging results reviewed. Results shared with Ms. Smucker, using Layman's terms.                         Meds   Current Outpatient Medications:  .  diclofenac (VOLTAREN) 75 MG EC tablet, Take 1 tablet (75 mg total) by mouth 2 (two) times daily as needed., Disp: 60 tablet, Rfl: 0 .  fluticasone (FLONASE) 50 MCG/ACT nasal spray, SPRAY 2 SPRAYS (100 MCG) IN EACH NOSTRIL BY INTRANASAL ROUTE ONCE DAILY, Disp: , Rfl: 4 .  gabapentin (NEURONTIN) 300 MG capsule, Take 1 capsule by mouth at bedtime., Disp:  , Rfl: 11 .  tiZANidine (ZANAFLEX) 4 MG tablet, Take 1 tablet (4 mg total) by mouth every 8 (eight) hours as needed for muscle spasms., Disp: 90 tablet, Rfl: 1 .  triamterene-hydrochlorothiazide (MAXZIDE-25) 37.5-25 MG tablet, Take 1 tablet by mouth daily., Disp: 90 tablet, Rfl: 1  ROS  Constitutional: Denies any fever or chills Gastrointestinal: No reported hemesis, hematochezia, vomiting, or acute GI distress Musculoskeletal: Denies any acute onset joint swelling, redness, loss of ROM, or weakness Neurological: No reported episodes of acute onset apraxia, aphasia, dysarthria, agnosia, amnesia, paralysis, loss of coordination, or loss of consciousness  Allergies  Ms. Titsworth is allergic to ace inhibitors.  PFSH  Drug: Ms. Bok  reports that she does not use drugs. Alcohol:  reports that she does not drink alcohol. Tobacco:  reports that she has never smoked. She has never used smokeless tobacco. Medical:  has a past medical history of Hypertension. Surgical: Ms. Zapf  has a past surgical history that includes Hernia repair (10/2014) and Cholecystectomy (2006). Family: family history includes Hypertension in her brother, mother, and sister.  Constitutional Exam  General appearance: Well nourished, well developed, and well hydrated. In no apparent acute distress Vitals:   05/07/18 1224  BP: (!) 155/97  Pulse: 64  Resp: 16  Temp: 98.6 F (37 C)  TempSrc: Oral  SpO2: 100%  Weight: 260 lb (117.9 kg)  Height: '5\' 4"'$  (1.626 m)   BMI Assessment: Estimated body mass index is 44.63 kg/m as calculated from the following:   Height as of this encounter: '5\' 4"'$  (1.626 m).  Weight as of this encounter: 260 lb (117.9 kg).  BMI interpretation table: BMI level Category Range association with higher incidence of chronic pain  <18 kg/m2 Underweight   18.5-24.9 kg/m2 Ideal body weight   25-29.9 kg/m2 Overweight Increased incidence by 20%  30-34.9 kg/m2 Obese (Class I) Increased incidence by  68%  35-39.9 kg/m2 Severe obesity (Class II) Increased incidence by 136%  >40 kg/m2 Extreme obesity (Class III) Increased incidence by 254%   Patient's current BMI Ideal Body weight  Body mass index is 44.63 kg/m. Ideal body weight: 54.7 kg (120 lb 9.5 oz) Adjusted ideal body weight: 80 kg (176 lb 5.7 oz)   BMI Readings from Last 4 Encounters:  05/07/18 44.63 kg/m  04/15/18 42.51 kg/m  04/06/18 43.93 kg/m  02/11/18 44.12 kg/m   Wt Readings from Last 4 Encounters:  05/07/18 260 lb (117.9 kg)  04/15/18 240 lb (108.9 kg)  04/06/18 248 lb (112.5 kg)  02/11/18 241 lb 3.2 oz (109.4 kg)  Psych/Mental status: Alert, oriented x 3 (person, place, & time)       Eyes: PERLA Respiratory: No evidence of acute respiratory distress  Cervical Spine Area Exam  Skin & Axial Inspection: No masses, redness, edema, swelling, or associated skin lesions Alignment: Symmetrical Functional ROM: Unrestricted ROM      Stability: No instability detected Muscle Tone/Strength: Functionally intact. No obvious neuro-muscular anomalies detected. Sensory (Neurological): Unimpaired Palpation: No palpable anomalies              Upper Extremity (UE) Exam    Side: Right upper extremity  Side: Left upper extremity  Skin & Extremity Inspection: Skin color, temperature, and hair growth are WNL. No peripheral edema or cyanosis. No masses, redness, swelling, asymmetry, or associated skin lesions. No contractures.  Skin & Extremity Inspection: Skin color, temperature, and hair growth are WNL. No peripheral edema or cyanosis. No masses, redness, swelling, asymmetry, or associated skin lesions. No contractures.  Functional ROM: Unrestricted ROM          Functional ROM: Unrestricted ROM          Muscle Tone/Strength: Functionally intact. No obvious neuro-muscular anomalies detected.  Muscle Tone/Strength: Functionally intact. No obvious neuro-muscular anomalies detected.  Sensory (Neurological): Unimpaired          Sensory  (Neurological): Unimpaired          Palpation: No palpable anomalies              Palpation: No palpable anomalies              Provocative Test(s):  Phalen's test: deferred Tinel's test: deferred Apley's scratch test (touch opposite shoulder):  Action 1 (Across chest): deferred Action 2 (Overhead): deferred Action 3 (LB reach): deferred   Provocative Test(s):  Phalen's test: deferred Tinel's test: deferred Apley's scratch test (touch opposite shoulder):  Action 1 (Across chest): deferred Action 2 (Overhead): deferred Action 3 (LB reach): deferred    Thoracic Spine Area Exam  Skin & Axial Inspection: No masses, redness, or swelling Alignment: Symmetrical Functional ROM: Unrestricted ROM Stability: No instability detected Muscle Tone/Strength: Functionally intact. No obvious neuro-muscular anomalies detected. Sensory (Neurological): Unimpaired Muscle strength & Tone: No palpable anomalies  Lumbar Spine Area Exam  Skin & Axial Inspection: No masses, redness, or swelling Alignment: Symmetrical Functional ROM: Improved after treatment       Stability: No instability detected Muscle Tone/Strength: Functionally intact. No obvious neuro-muscular anomalies detected. Sensory (Neurological): Improved Palpation: No palpable anomalies  Provocative Tests: Hyperextension/rotation test: Improved after treatment       Lumbar quadrant test (Kemp's test): Improved after treatment       Lateral bending test: deferred today       Patrick's Maneuver: deferred today                   FABER test: deferred today                   S-I anterior distraction/compression test: deferred today         S-I lateral compression test: deferred today         S-I Thigh-thrust test: deferred today         S-I Gaenslen's test: deferred today          Gait & Posture Assessment  Ambulation: Unassisted Gait: Relatively normal for age and body habitus Posture: WNL   Lower Extremity Exam    Side: Right  lower extremity  Side: Left lower extremity  Stability: No instability observed          Stability: No instability observed          Skin & Extremity Inspection: Skin color, temperature, and hair growth are WNL. No peripheral edema or cyanosis. No masses, redness, swelling, asymmetry, or associated skin lesions. No contractures.  Skin & Extremity Inspection: Skin color, temperature, and hair growth are WNL. No peripheral edema or cyanosis. No masses, redness, swelling, asymmetry, or associated skin lesions. No contractures.  Functional ROM: Unrestricted ROM                  Functional ROM: Unrestricted ROM                  Muscle Tone/Strength: Functionally intact. No obvious neuro-muscular anomalies detected.  Muscle Tone/Strength: Functionally intact. No obvious neuro-muscular anomalies detected.  Sensory (Neurological): Unimpaired  Sensory (Neurological): Unimpaired  Palpation: No palpable anomalies  Palpation: No palpable anomalies   Assessment  Primary Diagnosis & Pertinent Problem List: The primary encounter diagnosis was Lumbar radiculopathy (L5). Diagnoses of Degeneration of lumbar or lumbosacral intervertebral disc, Lumbar spondylosis, Morbid obesity ([place]), and Chronic pain syndrome were also pertinent to this visit.  Status Diagnosis  Improving Persistent Persistent 1. Lumbar radiculopathy (L5)   2. Degeneration of lumbar or lumbosacral intervertebral disc   3. Lumbar spondylosis   4. Morbid obesity ([place])   5. Chronic pain syndrome      47 year old female with L5 radiculopathy status post midline L5-S1 ESI who presents for follow-up.  Patient endorses significant benefit and pain relief after her lumbar epidural steroid injection.  She states that she is able to perform activities of daily living with greater ease and is able to ambulate without as much pain.  She continues to endorse ongoing benefit.  We discussed repeating the procedure if and when her pain returns.  We will place  an as needed order for this.  Plan of Care   Lab-work, procedure(s), and/or referral(s): Orders Placed This Encounter  Procedures  . Lumbar Epidural Injection   Time Note: Greater than 50% of the 15 minute(s) of face-to-face time spent with Ms. Roppolo, was spent in counseling/coordination of care regarding: the treatment plan, treatment alternatives, the results, interpretation and significance of  her recent diagnostic interventional treatment(s) and the goals of pain management (increased in functionality).  Provider-requested follow-up: Return if symptoms worsen or fail to improve.  No future appointments.  Primary Care Physician: Steele Sizer, MD Location: [facility]  Outpatient Pain Management Facility Note by: Gillis Santa, M.D Date: 05/07/2018; Time: 1:23 PM  Patient Instructions   Moderate Conscious Sedation, Adult Sedation is the use of medicines to promote relaxation and relieve discomfort and anxiety. Moderate conscious sedation is a type of sedation. Under moderate conscious sedation, you are less alert than normal, but you are still able to respond to instructions, touch, or both. Moderate conscious sedation is used during short medical and dental procedures. It is milder than deep sedation, which is a type of sedation under which you cannot be easily woken up. It is also milder than general anesthesia, which is the use of medicines to make you unconscious. Moderate conscious sedation allows you to return to your regular activities sooner. Tell a health care provider about:  Any allergies you have.  All medicines you are taking, including vitamins, herbs, eye drops, creams, and over-the-counter medicines.  Use of steroids (by mouth or creams).  Any problems you or family members have had with sedatives and anesthetic medicines.  Any blood disorders you have.  Any surgeries you have had.  Any medical conditions you have, such as sleep apnea.  Whether you are pregnant or  may be pregnant.  Any use of cigarettes, alcohol, marijuana, or street drugs. What are the risks? Generally, this is a safe procedure. However, problems may occur, including:  Getting too much medicine (oversedation).  Nausea.  Allergic reaction to medicines.  Trouble breathing. If this happens, a breathing tube may be used to help with breathing. It will be removed when you are awake and breathing on your own.  Heart trouble.  Lung trouble.  What happens before the procedure? Staying hydrated Follow instructions from your health care provider about hydration, which may include:  Up to 2 hours before the procedure - you may continue to drink clear liquids, such as water, clear fruit juice, black coffee, and plain tea.  Eating and drinking restrictions Follow instructions from your health care provider about eating and drinking, which may include:  8 hours before the procedure - stop eating heavy meals or foods such as meat, fried foods, or fatty foods.  6 hours before the procedure - stop eating light meals or foods, such as toast or cereal.  6 hours before the procedure - stop drinking milk or drinks that contain milk.  2 hours before the procedure - stop drinking clear liquids.  Medicine  Ask your health care provider about:  Changing or stopping your regular medicines. This is especially important if you are taking diabetes medicines or blood thinners.  Taking medicines such as aspirin and ibuprofen. These medicines can thin your blood. Do not take these medicines before your procedure if your health care provider instructs you not to.  Tests and exams  You will have a physical exam.  You may have blood tests done to show: ? How well your kidneys and liver are working. ? How well your blood can clot. General instructions  Plan to have someone take you home from the hospital or clinic.  If you will be going home right after the procedure, plan to have someone  with you for 24 hours. What happens during the procedure?  An IV tube will be inserted into one of your veins.  Medicine to help you relax (sedative) will be given through the IV tube.  The medical or dental procedure will be performed. What happens after the procedure?  Your blood pressure, heart rate, breathing rate, and blood oxygen level  will be monitored often until the medicines you were given have worn off.  Do not drive for 24 hours. This information is not intended to replace advice given to you by your health care provider. Make sure you discuss any questions you have with your health care provider. Document Released: 06/04/2001 Document Revised: 02/13/2016 Document Reviewed: 12/30/2015 Elsevier Interactive Patient Education  2018 [place]  What are the risk, side effects and possible complications? Generally speaking, most procedures are safe.  However, with any procedure there are risks, side effects, and the possibility of complications.  The risks and complications are dependent upon the sites that are lesioned, or the type of nerve block to be performed.  The closer the procedure is to the spine, the more serious the risks are.  Great care is taken when placing the radio frequency needles, block needles or lesioning probes, but sometimes complications can occur. 1. Infection: Any time there is an injection through the skin, there is a risk of infection.  This is why sterile conditions are used for these blocks.  There are four possible types of infection. 1. Localized skin infection. 2. Central Nervous System Infection-This can be in the form of Meningitis, which can be deadly. 3. Epidural Infections-This can be in the form of an epidural abscess, which can cause pressure inside of the spine, causing compression of the spinal cord with subsequent paralysis. This would require an emergency surgery to decompress, and there are no guarantees  that the patient would recover from the paralysis. 4. Discitis-This is an infection of the intervertebral discs.  It occurs in about 1% of discography procedures.  It is difficult to treat and it may lead to surgery.        2. Pain: the needles have to go through skin and soft tissues, will cause soreness.       3. Damage to internal structures:  The nerves to be lesioned may be near blood vessels or    other nerves which can be potentially damaged.       4. Bleeding: Bleeding is more common if the patient is taking blood thinners such as  aspirin, Coumadin, Ticiid, Plavix, etc., or if he/she have some genetic predisposition  such as hemophilia. Bleeding into the spinal canal can cause compression of the spinal  cord with subsequent paralysis.  This would require an emergency surgery to  decompress and there are no guarantees that the patient would recover from the  paralysis.       5. Pneumothorax:  Puncturing of a lung is a possibility, every time a needle is introduced in  the area of the chest or upper back.  Pneumothorax refers to free air around the  collapsed lung(s), inside of the thoracic cavity (chest cavity).  Another two possible  complications related to a similar event would include: Hemothorax and Chylothorax.   These are variations of the Pneumothorax, where instead of air around the collapsed  lung(s), you may have blood or chyle, respectively.       6. Spinal headaches: They may occur with any procedures in the area of the spine.       7. Persistent CSF (Cerebro-Spinal Fluid) leakage: This is a rare problem, but may occur  with prolonged intrathecal or epidural catheters either due to the formation of a fistulous  track or a dural tear.       8. Nerve damage: By working so close to the spinal cord, there is  always a possibility of  nerve damage, which could be as serious as a permanent spinal cord injury with  paralysis.       9. Death:  Although rare, severe deadly allergic reactions  known as "Anaphylactic  reaction" can occur to any of the medications used.      10. Worsening of the symptoms:  We can always make thing worse.  What are the chances of something like this happening? Chances of any of this occuring are extremely low.  By statistics, you have more of a chance of getting killed in a motor vehicle accident: while driving to the hospital than any of the above occurring .  Nevertheless, you should be aware that they are possibilities.  In general, it is similar to taking a shower.  Everybody knows that you can slip, hit your head and get killed.  Does that mean that you should not shower again?  Nevertheless always keep in mind that statistics do not mean anything if you happen to be on the wrong side of them.  Even if a procedure has a 1 (one) in a 1,000,000 (million) chance of going wrong, it you happen to be that one..Also, keep in mind that by statistics, you have more of a chance of having something go wrong when taking medications.  Who should not have this procedure? If you are on a blood thinning medication (e.g. Coumadin, Plavix, see list of "Blood Thinners"), or if you have an active infection going on, you should not have the procedure.  If you are taking any blood thinners, please inform your physician.  How should I prepare for this procedure?  Do not eat or drink anything at least six hours prior to the procedure.  Bring a driver with you .  It cannot be a taxi.  Come accompanied by an adult that can drive you back, and that is strong enough to help you if your legs get weak or numb from the local anesthetic.  Take all of your medicines the morning of the procedure with just enough water to swallow them.  If you have diabetes, make sure that you are scheduled to have your procedure done first thing in the morning, whenever possible.  If you have diabetes, take only half of your insulin dose and notify our nurse that you have done so as soon as you  arrive at the clinic.  If you are diabetic, but only take blood sugar pills (oral hypoglycemic), then do not take them on the morning of your procedure.  You may take them after you have had the procedure.  Do not take aspirin or any aspirin-containing medications, at least eleven (11) days prior to the procedure.  They may prolong bleeding.  Wear loose fitting clothing that may be easy to take off and that you would not mind if it got stained with Betadine or blood.  Do not wear any jewelry or perfume  Remove any nail coloring.  It will interfere with some of our monitoring equipment.  NOTE: Remember that this is not meant to be interpreted as a complete list of all possible complications.  Unforeseen problems may occur.  BLOOD THINNERS The following drugs contain aspirin or other products, which can cause increased bleeding during surgery and should not be taken for 2 weeks prior to and 1 week after surgery.  If you should need take something for relief of minor pain, you may take acetaminophen which is found in Tylenol,m Datril, Anacin-3 and  Panadol. It is not blood thinner. The products listed below are.  Do not take any of the products listed below in addition to any listed on your instruction sheet.  A.P.C or A.P.C with Codeine Codeine Phosphate Capsules #3 Ibuprofen Ridaura  ABC compound Congesprin Imuran rimadil  Advil Cope Indocin Robaxisal  Alka-Seltzer Effervescent Pain Reliever and Antacid Coricidin or Coricidin-D  Indomethacin Rufen  Alka-Seltzer plus Cold Medicine Cosprin Ketoprofen S-A-C Tablets  Anacin Analgesic Tablets or Capsules Coumadin Korlgesic Salflex  Anacin Extra Strength Analgesic tablets or capsules CP-2 Tablets Lanoril Salicylate  Anaprox Cuprimine Capsules Levenox Salocol  Anexsia-D Dalteparin Magan Salsalate  Anodynos Darvon compound Magnesium Salicylate Sine-off  Ansaid Dasin Capsules Magsal Sodium Salicylate  Anturane Depen Capsules Marnal Soma  APF  Arthritis pain formula Dewitt's Pills Measurin Stanback  Argesic Dia-Gesic Meclofenamic Sulfinpyrazone  Arthritis Bayer Timed Release Aspirin Diclofenac Meclomen Sulindac  Arthritis pain formula Anacin Dicumarol Medipren Supac  Analgesic (Safety coated) Arthralgen Diffunasal Mefanamic Suprofen  Arthritis Strength Bufferin Dihydrocodeine Mepro Compound Suprol  Arthropan liquid Dopirydamole Methcarbomol with Aspirin Synalgos  ASA tablets/Enseals Disalcid Micrainin Tagament  Ascriptin Doan's Midol Talwin  Ascriptin A/D Dolene Mobidin Tanderil  Ascriptin Extra Strength Dolobid Moblgesic Ticlid  Ascriptin with Codeine Doloprin or Doloprin with Codeine Momentum Tolectin  Asperbuf Duoprin Mono-gesic Trendar  Aspergum Duradyne Motrin or Motrin IB Triminicin  Aspirin plain, buffered or enteric coated Durasal Myochrisine Trigesic  Aspirin Suppositories Easprin Nalfon Trillsate  Aspirin with Codeine Ecotrin Regular or Extra Strength Naprosyn Uracel  Atromid-S Efficin Naproxen Ursinus  Auranofin Capsules Elmiron Neocylate Vanquish  Axotal Emagrin Norgesic Verin  Azathioprine Empirin or Empirin with Codeine Normiflo Vitamin E  Azolid Emprazil Nuprin Voltaren  Bayer Aspirin plain, buffered or children's or timed BC Tablets or powders Encaprin Orgaran Warfarin Sodium  Buff-a-Comp Enoxaparin Orudis Zorpin  Buff-a-Comp with Codeine Equegesic Os-Cal-Gesic   Buffaprin Excedrin plain, buffered or Extra Strength Oxalid   Bufferin Arthritis Strength Feldene Oxphenbutazone   Bufferin plain or Extra Strength Feldene Capsules Oxycodone with Aspirin   Bufferin with Codeine Fenoprofen Fenoprofen Pabalate or Pabalate-SF   Buffets II Flogesic Panagesic   Buffinol plain or Extra Strength Florinal or Florinal with Codeine Panwarfarin   Buf-Tabs Flurbiprofen Penicillamine   Butalbital Compound Four-way cold tablets Penicillin   Butazolidin Fragmin Pepto-Bismol   Carbenicillin Geminisyn Percodan   Carna Arthritis  Reliever Geopen Persantine   Carprofen Gold's salt Persistin   Chloramphenicol Goody's Phenylbutazone   Chloromycetin Haltrain Piroxlcam   Clmetidine heparin Plaquenil   Cllnoril Hyco-pap Ponstel   Clofibrate Hydroxy chloroquine Propoxyphen         Before stopping any of these medications, be sure to consult the physician who ordered them.  Some, such as Coumadin (Warfarin) are ordered to prevent or treat serious conditions such as "deep thrombosis", "pumonary embolisms", and other heart problems.  The amount of time that you may need off of the medication may also vary with the medication and the reason for which you were taking it.  If you are taking any of these medications, please make sure you notify your pain physician before you undergo any procedures.         Epidural Steroid Injection Patient Information  Description: The epidural space surrounds the nerves as they exit the spinal cord.  In some patients, the nerves can be compressed and inflamed by a bulging disc or a tight spinal canal (spinal stenosis).  By injecting steroids into the epidural space, we can bring irritated nerves into direct contact  with a potentially helpful medication.  These steroids act directly on the irritated nerves and can reduce swelling and inflammation which often leads to decreased pain.  Epidural steroids may be injected anywhere along the spine and from the neck to the low back depending upon the location of your pain.   After numbing the skin with local anesthetic (like Novocaine), a small needle is passed into the epidural space slowly.  You may experience a sensation of pressure while this is being done.  The entire block usually last less than 10 minutes.  Conditions which may be treated by epidural steroids:   Low back and leg pain  Neck and arm pain  Spinal stenosis  Post-laminectomy syndrome  Herpes zoster (shingles) pain  Pain from compression fractures  Preparation for the  injection:  1. Do not eat any solid food or dairy products within 8 hours of your appointment.  2. You may drink clear liquids up to 3 hours before appointment.  Clear liquids include water, black coffee, juice or soda.  No milk or cream please. 3. You may take your regular medication, including pain medications, with a sip of water before your appointment  Diabetics should hold regular insulin (if taken separately) and take 1/2 normal NPH dos the morning of the procedure.  Carry some sugar containing items with you to your appointment. 4. A driver must accompany you and be prepared to drive you home after your procedure.  5. Bring all your current medications with your. 6. An IV may be inserted and sedation may be given at the discretion of the physician.   7. A blood pressure cuff, EKG and other monitors will often be applied during the procedure.  Some patients may need to have extra oxygen administered for a short period. 8. You will be asked to provide medical information, including your allergies, prior to the procedure.  We must know immediately if you are taking blood thinners (like Coumadin/Warfarin)  Or if you are allergic to IV iodine contrast (dye). We must know if you could possible be pregnant.  Possible side-effects:  Bleeding from needle site  Infection (rare, may require surgery)  Nerve injury (rare)  Numbness & tingling (temporary)  Difficulty urinating (rare, temporary)  Spinal headache ( a headache worse with upright posture)  Light -headedness (temporary)  Pain at injection site (several days)  Decreased blood pressure (temporary)  Weakness in arm/leg (temporary)  Pressure sensation in back/neck (temporary)  Call if you experience:  Fever/chills associated with headache or increased back/neck pain.  Headache worsened by an upright position.  New onset weakness or numbness of an extremity below the injection site  Hives or difficulty breathing (go to the  emergency room)  Inflammation or drainage at the infection site  Severe back/neck pain  Any new symptoms which are concerning to you  Please note:  Although the local anesthetic injected can often make your back or neck feel good for several hours after the injection, the pain will likely return.  It takes 3-7 days for steroids to work in the epidural space.  You may not notice any pain relief for at least that one week.  If effective, we will often do a series of three injections spaced 3-6 weeks apart to maximally decrease your pain.  After the initial series, we generally will wait several months before considering a repeat injection of the same type.  If you have any questions, please call [phone] [facility]

## 2018-05-07 NOTE — Patient Instructions (Signed)
Moderate Conscious Sedation, Adult Sedation is the use of medicines to promote relaxation and relieve discomfort and anxiety. Moderate conscious sedation is a type of sedation. Under moderate conscious sedation, you are less alert than normal, but you are still able to respond to instructions, touch, or both. Moderate conscious sedation is used during short medical and dental procedures. It is milder than deep sedation, which is a type of sedation under which you cannot be easily woken up. It is also milder than general anesthesia, which is the use of medicines to make you unconscious. Moderate conscious sedation allows you to return to your regular activities sooner. Tell a health care provider about:  Any allergies you have.  All medicines you are taking, including vitamins, herbs, eye drops, creams, and over-the-counter medicines.  Use of steroids (by mouth or creams).  Any problems you or family members have had with sedatives and anesthetic medicines.  Any blood disorders you have.  Any surgeries you have had.  Any medical conditions you have, such as sleep apnea.  Whether you are pregnant or may be pregnant.  Any use of cigarettes, alcohol, marijuana, or street drugs. What are the risks? Generally, this is a safe procedure. However, problems may occur, including:  Getting too much medicine (oversedation).  Nausea.  Allergic reaction to medicines.  Trouble breathing. If this happens, a breathing tube may be used to help with breathing. It will be removed when you are awake and breathing on your own.  Heart trouble.  Lung trouble.  What happens before the procedure? Staying hydrated Follow instructions from your health care provider about hydration, which may include:  Up to 2 hours before the procedure - you may continue to drink clear liquids, such as water, clear fruit juice, black coffee, and plain tea.  Eating and drinking restrictions Follow instructions from  your health care provider about eating and drinking, which may include:  8 hours before the procedure - stop eating heavy meals or foods such as meat, fried foods, or fatty foods.  6 hours before the procedure - stop eating light meals or foods, such as toast or cereal.  6 hours before the procedure - stop drinking milk or drinks that contain milk.  2 hours before the procedure - stop drinking clear liquids.  Medicine  Ask your health care provider about:  Changing or stopping your regular medicines. This is especially important if you are taking diabetes medicines or blood thinners.  Taking medicines such as aspirin and ibuprofen. These medicines can thin your blood. Do not take these medicines before your procedure if your health care provider instructs you not to.  Tests and exams  You will have a physical exam.  You may have blood tests done to show: ? How well your kidneys and liver are working. ? How well your blood can clot. General instructions  Plan to have someone take you home from the hospital or clinic.  If you will be going home right after the procedure, plan to have someone with you for 24 hours. What happens during the procedure?  An IV tube will be inserted into one of your veins.  Medicine to help you relax (sedative) will be given through the IV tube.  The medical or dental procedure will be performed. What happens after the procedure?  Your blood pressure, heart rate, breathing rate, and blood oxygen level will be monitored often until the medicines you were given have worn off.  Do not drive for 24 hours.   This information is not intended to replace advice given to you by your health care provider. Make sure you discuss any questions you have with your health care provider. Document Released: 06/04/2001 Document Revised: 02/13/2016 Document Reviewed: 12/30/2015 [employer] Interactive Patient Education  2018 [employer] GENERAL RISKS AND  COMPLICATIONS  What are the risk, side effects and possible complications? Generally speaking, most procedures are safe.  However, with any procedure there are risks, side effects, and the possibility of complications.  The risks and complications are dependent upon the sites that are lesioned, or the type of nerve block to be performed.  The closer the procedure is to the spine, the more serious the risks are.  Great care is taken when placing the radio frequency needles, block needles or lesioning probes, but sometimes complications can occur. 1. Infection: Any time there is an injection through the skin, there is a risk of infection.  This is why sterile conditions are used for these blocks.  There are four possible types of infection. 1. Localized skin infection. 2. Central Nervous System Infection-This can be in the form of Meningitis, which can be deadly. 3. Epidural Infections-This can be in the form of an epidural abscess, which can cause pressure inside of the spine, causing compression of the spinal cord with subsequent paralysis. This would require an emergency surgery to decompress, and there are no guarantees that the patient would recover from the paralysis. 4. Discitis-This is an infection of the intervertebral discs.  It occurs in about 1% of discography procedures.  It is difficult to treat and it may lead to surgery.        2. Pain: the needles have to go through skin and soft tissues, will cause soreness.       3. Damage to internal structures:  The nerves to be lesioned may be near blood vessels or    other nerves which can be potentially damaged.       4. Bleeding: Bleeding is more common if the patient is taking blood thinners such as  aspirin, Coumadin, Ticiid, Plavix, etc., or if he/she have some genetic predisposition  such as hemophilia. Bleeding into the spinal canal can cause compression of the spinal  cord with subsequent paralysis.  This would require an emergency surgery  to  decompress and there are no guarantees that the patient would recover from the  paralysis.       5. Pneumothorax:  Puncturing of a lung is a possibility, every time a needle is introduced in  the area of the chest or upper back.  Pneumothorax refers to free air around the  collapsed lung(s), inside of the thoracic cavity (chest cavity).  Another two possible  complications related to a similar event would include: Hemothorax and Chylothorax.   These are variations of the Pneumothorax, where instead of air around the collapsed  lung(s), you may have blood or chyle, respectively.       6. Spinal headaches: They may occur with any procedures in the area of the spine.       7. Persistent CSF (Cerebro-Spinal Fluid) leakage: This is a rare problem, but may occur  with prolonged intrathecal or epidural catheters either due to the formation of a fistulous  track or a dural tear.       8. Nerve damage: By working so close to the spinal cord, there is always a possibility of  nerve damage, which could be as serious as a permanent spinal cord injury with    paralysis.       9. Death:  Although rare, severe deadly allergic reactions known as "Anaphylactic  reaction" can occur to any of the medications used.      10. Worsening of the symptoms:  We can always make thing worse.  What are the chances of something like this happening? Chances of any of this occuring are extremely low.  By statistics, you have more of a chance of getting killed in a motor vehicle accident: while driving to the hospital than any of the above occurring .  Nevertheless, you should be aware that they are possibilities.  In general, it is similar to taking a shower.  Everybody knows that you can slip, hit your head and get killed.  Does that mean that you should not shower again?  Nevertheless always keep in mind that statistics do not mean anything if you happen to be on the wrong side of them.  Even if a procedure has a 1 (one) in a 1,000,000  (million) chance of going wrong, it you happen to be that one..Also, keep in mind that by statistics, you have more of a chance of having something go wrong when taking medications.  Who should not have this procedure? If you are on a blood thinning medication (e.g. Coumadin, Plavix, see list of "Blood Thinners"), or if you have an active infection going on, you should not have the procedure.  If you are taking any blood thinners, please inform your physician.  How should I prepare for this procedure?  Do not eat or drink anything at least six hours prior to the procedure.  Bring a driver with you .  It cannot be a taxi.  Come accompanied by an adult that can drive you back, and that is strong enough to help you if your legs get weak or numb from the local anesthetic.  Take all of your medicines the morning of the procedure with just enough water to swallow them.  If you have diabetes, make sure that you are scheduled to have your procedure done first thing in the morning, whenever possible.  If you have diabetes, take only half of your insulin dose and notify our nurse that you have done so as soon as you arrive at the clinic.  If you are diabetic, but only take blood sugar pills (oral hypoglycemic), then do not take them on the morning of your procedure.  You may take them after you have had the procedure.  Do not take aspirin or any aspirin-containing medications, at least eleven (11) days prior to the procedure.  They may prolong bleeding.  Wear loose fitting clothing that may be easy to take off and that you would not mind if it got stained with Betadine or blood.  Do not wear any jewelry or perfume  Remove any nail coloring.  It will interfere with some of our monitoring equipment.  NOTE: Remember that this is not meant to be interpreted as a complete list of all possible complications.  Unforeseen problems may occur.  BLOOD THINNERS The following drugs contain aspirin or other  products, which can cause increased bleeding during surgery and should not be taken for 2 weeks prior to and 1 week after surgery.  If you should need take something for relief of minor pain, you may take acetaminophen which is found in Tylenol,m Datril, Anacin-3 and Panadol. It is not blood thinner. The products listed below are.  Do not take any of the products listed   below in addition to any listed on your instruction sheet.  A.P.C or A.P.C with Codeine Codeine Phosphate Capsules #3 Ibuprofen Ridaura  ABC compound Congesprin Imuran rimadil  Advil Cope Indocin Robaxisal  Alka-Seltzer Effervescent Pain Reliever and Antacid Coricidin or Coricidin-D  Indomethacin Rufen  Alka-Seltzer plus Cold Medicine Cosprin Ketoprofen S-A-C Tablets  Anacin Analgesic Tablets or Capsules Coumadin Korlgesic Salflex  Anacin Extra Strength Analgesic tablets or capsules CP-2 Tablets Lanoril Salicylate  Anaprox Cuprimine Capsules Levenox Salocol  Anexsia-D Dalteparin Magan Salsalate  Anodynos Darvon compound Magnesium Salicylate Sine-off  Ansaid Dasin Capsules Magsal Sodium Salicylate  Anturane Depen Capsules Marnal Soma  APF Arthritis pain formula Dewitt's Pills Measurin Stanback  Argesic Dia-Gesic Meclofenamic Sulfinpyrazone  Arthritis Bayer Timed Release Aspirin Diclofenac Meclomen Sulindac  Arthritis pain formula Anacin Dicumarol Medipren Supac  Analgesic (Safety coated) Arthralgen Diffunasal Mefanamic Suprofen  Arthritis Strength Bufferin Dihydrocodeine Mepro Compound Suprol  Arthropan liquid Dopirydamole Methcarbomol with Aspirin Synalgos  ASA tablets/Enseals Disalcid Micrainin Tagament  Ascriptin Doan's Midol Talwin  Ascriptin A/D Dolene Mobidin Tanderil  Ascriptin Extra Strength Dolobid Moblgesic Ticlid  Ascriptin with Codeine Doloprin or Doloprin with Codeine Momentum Tolectin  Asperbuf Duoprin Mono-gesic Trendar  Aspergum Duradyne Motrin or Motrin IB Triminicin  Aspirin plain, buffered or enteric  coated Durasal Myochrisine Trigesic  Aspirin Suppositories Easprin Nalfon Trillsate  Aspirin with Codeine Ecotrin Regular or Extra Strength Naprosyn Uracel  Atromid-S Efficin Naproxen Ursinus  Auranofin Capsules Elmiron Neocylate Vanquish  Axotal Emagrin Norgesic Verin  Azathioprine Empirin or Empirin with Codeine Normiflo Vitamin E  Azolid Emprazil Nuprin Voltaren  Bayer Aspirin plain, buffered or children's or timed BC Tablets or powders Encaprin Orgaran Warfarin Sodium  Buff-a-Comp Enoxaparin Orudis Zorpin  Buff-a-Comp with Codeine Equegesic Os-Cal-Gesic   Buffaprin Excedrin plain, buffered or Extra Strength Oxalid   Bufferin Arthritis Strength Feldene Oxphenbutazone   Bufferin plain or Extra Strength Feldene Capsules Oxycodone with Aspirin   Bufferin with Codeine Fenoprofen Fenoprofen Pabalate or Pabalate-SF   Buffets II Flogesic Panagesic   Buffinol plain or Extra Strength Florinal or Florinal with Codeine Panwarfarin   Buf-Tabs Flurbiprofen Penicillamine   Butalbital Compound Four-way cold tablets Penicillin   Butazolidin Fragmin Pepto-Bismol   Carbenicillin Geminisyn Percodan   Carna Arthritis Reliever Geopen Persantine   Carprofen Gold's salt Persistin   Chloramphenicol Goody's Phenylbutazone   Chloromycetin Haltrain Piroxlcam   Clmetidine heparin Plaquenil   Cllnoril Hyco-pap Ponstel   Clofibrate Hydroxy chloroquine Propoxyphen         Before stopping any of these medications, be sure to consult the physician who ordered them.  Some, such as Coumadin (Warfarin) are ordered to prevent or treat serious conditions such as "deep thrombosis", "pumonary embolisms", and other heart problems.  The amount of time that you may need off of the medication may also vary with the medication and the reason for which you were taking it.  If you are taking any of these medications, please make sure you notify your pain physician before you undergo any procedures.         Epidural  Steroid Injection Patient Information  Description: The epidural space surrounds the nerves as they exit the spinal cord.  In some patients, the nerves can be compressed and inflamed by a bulging disc or a tight spinal canal (spinal stenosis).  By injecting steroids into the epidural space, we can bring irritated nerves into direct contact with a potentially helpful medication.  These steroids act directly on the irritated nerves and can reduce swelling and inflammation  which often leads to decreased pain.  Epidural steroids may be injected anywhere along the spine and from the neck to the low back depending upon the location of your pain.   After numbing the skin with local anesthetic (like Novocaine), a small needle is passed into the epidural space slowly.  You may experience a sensation of pressure while this is being done.  The entire block usually last less than 10 minutes.  Conditions which may be treated by epidural steroids:   Low back and leg pain  Neck and arm pain  Spinal stenosis  Post-laminectomy syndrome  Herpes zoster (shingles) pain  Pain from compression fractures  Preparation for the injection:  1. Do not eat any solid food or dairy products within 8 hours of your appointment.  2. You may drink clear liquids up to 3 hours before appointment.  Clear liquids include water, black coffee, juice or soda.  No milk or cream please. 3. You may take your regular medication, including pain medications, with a sip of water before your appointment  Diabetics should hold regular insulin (if taken separately) and take 1/2 normal NPH dos the morning of the procedure.  Carry some sugar containing items with you to your appointment. 4. A driver must accompany you and be prepared to drive you home after your procedure.  5. Bring all your current medications with your. 6. An IV may be inserted and sedation may be given at the discretion of the physician.   7. A blood pressure cuff, EKG  and other monitors will often be applied during the procedure.  Some patients may need to have extra oxygen administered for a short period. 8. You will be asked to provide medical information, including your allergies, prior to the procedure.  We must know immediately if you are taking blood thinners (like Coumadin/Warfarin)  Or if you are allergic to IV iodine contrast (dye). We must know if you could possible be pregnant.  Possible side-effects:  Bleeding from needle site  Infection (rare, may require surgery)  Nerve injury (rare)  Numbness & tingling (temporary)  Difficulty urinating (rare, temporary)  Spinal headache ( a headache worse with upright posture)  Light -headedness (temporary)  Pain at injection site (several days)  Decreased blood pressure (temporary)  Weakness in arm/leg (temporary)  Pressure sensation in back/neck (temporary)  Call if you experience:  Fever/chills associated with headache or increased back/neck pain.  Headache worsened by an upright position.  New onset weakness or numbness of an extremity below the injection site  Hives or difficulty breathing (go to the emergency room)  Inflammation or drainage at the infection site  Severe back/neck pain  Any new symptoms which are concerning to you  Please note:  Although the local anesthetic injected can often make your back or neck feel good for several hours after the injection, the pain will likely return.  It takes 3-7 days for steroids to work in the epidural space.  You may not notice any pain relief for at least that one week.  If effective, we will often do a series of three injections spaced 3-6 weeks apart to maximally decrease your pain.  After the initial series, we generally will wait several months before considering a repeat injection of the same type.  If you have any questions, please call [phone] [facility]

## 2018-05-07 NOTE — Progress Notes (Signed)
Safety precautions to be maintained throughout the outpatient stay will include: orient to surroundings, keep bed in low position, maintain call bell within reach at all times, provide assistance with transfer out of bed and ambulation.  

## 2018-08-13 ENCOUNTER — Ambulatory Visit
Payer: 59 | Attending: Student in an Organized Health Care Education/Training Program | Admitting: Student in an Organized Health Care Education/Training Program

## 2018-08-13 ENCOUNTER — Other Ambulatory Visit: Payer: Self-pay

## 2018-08-13 ENCOUNTER — Encounter: Payer: Self-pay | Admitting: Student in an Organized Health Care Education/Training Program

## 2018-08-13 VITALS — BP 147/77 | HR 59 | Temp 98.0°F | Resp 16 | Ht 63.0 in | Wt 220.0 lb

## 2018-08-13 DIAGNOSIS — M5416 Radiculopathy, lumbar region: Secondary | ICD-10-CM | POA: Diagnosis not present

## 2018-08-13 DIAGNOSIS — F329 Major depressive disorder, single episode, unspecified: Secondary | ICD-10-CM | POA: Diagnosis not present

## 2018-08-13 DIAGNOSIS — M5137 Other intervertebral disc degeneration, lumbosacral region: Secondary | ICD-10-CM | POA: Insufficient documentation

## 2018-08-13 DIAGNOSIS — M5116 Intervertebral disc disorders with radiculopathy, lumbar region: Secondary | ICD-10-CM | POA: Insufficient documentation

## 2018-08-13 DIAGNOSIS — Z79899 Other long term (current) drug therapy: Secondary | ICD-10-CM | POA: Insufficient documentation

## 2018-08-13 DIAGNOSIS — G894 Chronic pain syndrome: Secondary | ICD-10-CM | POA: Insufficient documentation

## 2018-08-13 DIAGNOSIS — K429 Umbilical hernia without obstruction or gangrene: Secondary | ICD-10-CM | POA: Diagnosis not present

## 2018-08-13 DIAGNOSIS — K219 Gastro-esophageal reflux disease without esophagitis: Secondary | ICD-10-CM | POA: Diagnosis not present

## 2018-08-13 DIAGNOSIS — E559 Vitamin D deficiency, unspecified: Secondary | ICD-10-CM | POA: Insufficient documentation

## 2018-08-13 DIAGNOSIS — J309 Allergic rhinitis, unspecified: Secondary | ICD-10-CM | POA: Insufficient documentation

## 2018-08-13 DIAGNOSIS — F4310 Post-traumatic stress disorder, unspecified: Secondary | ICD-10-CM | POA: Insufficient documentation

## 2018-08-13 DIAGNOSIS — F419 Anxiety disorder, unspecified: Secondary | ICD-10-CM | POA: Diagnosis not present

## 2018-08-13 DIAGNOSIS — Z6841 Body Mass Index (BMI) 40.0 and over, adult: Secondary | ICD-10-CM | POA: Insufficient documentation

## 2018-08-13 DIAGNOSIS — Z7951 Long term (current) use of inhaled steroids: Secondary | ICD-10-CM | POA: Insufficient documentation

## 2018-08-13 DIAGNOSIS — M545 Low back pain: Secondary | ICD-10-CM | POA: Diagnosis present

## 2018-08-13 DIAGNOSIS — M722 Plantar fascial fibromatosis: Secondary | ICD-10-CM | POA: Insufficient documentation

## 2018-08-13 DIAGNOSIS — M4316 Spondylolisthesis, lumbar region: Secondary | ICD-10-CM | POA: Insufficient documentation

## 2018-08-13 DIAGNOSIS — I10 Essential (primary) hypertension: Secondary | ICD-10-CM | POA: Insufficient documentation

## 2018-08-13 DIAGNOSIS — M79605 Pain in left leg: Secondary | ICD-10-CM | POA: Diagnosis present

## 2018-08-13 MED ORDER — GABAPENTIN 300 MG PO CAPS: 300.0000 mg | ORAL_CAPSULE | Freq: Every day | ORAL | 11 refills | Status: DC

## 2018-08-13 MED ORDER — TIZANIDINE HCL 4 MG PO TABS: 4.0000 mg | ORAL_TABLET | Freq: Every day | ORAL | 11 refills | Status: DC

## 2018-08-13 NOTE — Patient Instructions (Signed)
Gabapentin and Tizanidine have been escribed to your pharmacy.  Epidural Steroid Injection Patient Information  Description: The epidural space surrounds the nerves as they exit the spinal cord.  In some patients, the nerves can be compressed and inflamed by a bulging disc or a tight spinal canal (spinal stenosis).  By injecting steroids into the epidural space, we can bring irritated nerves into direct contact with a potentially helpful medication.  These steroids act directly on the irritated nerves and can reduce swelling and inflammation which often leads to decreased pain.  Epidural steroids may be injected anywhere along the spine and from the neck to the low back depending upon the location of your pain.   After numbing the skin with local anesthetic (like Novocaine), a small needle is passed into the epidural space slowly.  You may experience a sensation of pressure while this is being done.  The entire block usually last less than 10 minutes.  Conditions which may be treated by epidural steroids:   Low back and leg pain  Neck and arm pain  Spinal stenosis  Post-laminectomy syndrome  Herpes zoster (shingles) pain  Pain from compression fractures  Preparation for the injection:  1. Do not eat any solid food or dairy products within 8 hours of your appointment.  2. You may drink clear liquids up to 3 hours before appointment.  Clear liquids include water, black coffee, juice or soda.  No milk or cream please. 3. You may take your regular medication, including pain medications, with a sip of water before your appointment  Diabetics should hold regular insulin (if taken separately) and take 1/2 normal NPH dos the morning of the procedure.  Carry some sugar containing items with you to your appointment. 4. A driver must accompany you and be prepared to drive you home after your procedure.  5. Bring all your current medications with your. 6. An IV may be inserted and sedation may be  given at the discretion of the physician.   7. A blood pressure cuff, EKG and other monitors will often be applied during the procedure.  Some patients may need to have extra oxygen administered for a short period. 8. You will be asked to provide medical information, including your allergies, prior to the procedure.  We must know immediately if you are taking blood thinners (like Coumadin/Warfarin)  Or if you are allergic to IV iodine contrast (dye). We must know if you could possible be pregnant.  Possible side-effects:  Bleeding from needle site  Infection (rare, may require surgery)  Nerve injury (rare)  Numbness & tingling (temporary)  Difficulty urinating (rare, temporary)  Spinal headache ( a headache worse with upright posture)  Light -headedness (temporary)  Pain at injection site (several days)  Decreased blood pressure (temporary)  Weakness in arm/leg (temporary)  Pressure sensation in back/neck (temporary)  Call if you experience:  Fever/chills associated with headache or increased back/neck pain.  Headache worsened by an upright position.  New onset weakness or numbness of an extremity below the injection site  Hives or difficulty breathing (go to the emergency room)  Inflammation or drainage at the infection site  Severe back/neck pain  Any new symptoms which are concerning to you  Please note:  Although the local anesthetic injected can often make your back or neck feel good for several hours after the injection, the pain will likely return.  It takes 3-7 days for steroids to work in the epidural space.  You may not notice  any pain relief for at least that one week.  If effective, we will often do a series of three injections spaced 3-6 weeks apart to maximally decrease your pain.  After the initial series, we generally will wait several months before considering a repeat injection of the same type.  If you have any questions, please call [phone]  [phone] [facility]  Moderate Conscious Sedation, Adult Sedation is the use of medicines to promote relaxation and relieve discomfort and anxiety. Moderate conscious sedation is a type of sedation. Under moderate conscious sedation, you are less alert than normal, but you are still able to respond to instructions, touch, or both. Moderate conscious sedation is used during short medical and dental procedures. It is milder than deep sedation, which is a type of sedation under which you cannot be easily woken up. It is also milder than general anesthesia, which is the use of medicines to make you unconscious. Moderate conscious sedation allows you to return to your regular activities sooner. Tell a health care provider about:  Any allergies you have.  All medicines you are taking, including vitamins, herbs, eye drops, creams, and over-the-counter medicines.  Use of steroids (by mouth or creams).  Any problems you or family members have had with sedatives and anesthetic medicines.  Any blood disorders you have.  Any surgeries you have had.  Any medical conditions you have, such as sleep apnea.  Whether you are pregnant or may be pregnant.  Any use of cigarettes, alcohol, marijuana, or street drugs. What are the risks? Generally, this is a safe procedure. However, problems may occur, including:  Getting too much medicine (oversedation).  Nausea.  Allergic reaction to medicines.  Trouble breathing. If this happens, a breathing tube may be used to help with breathing. It will be removed when you are awake and breathing on your own.  Heart trouble.  Lung trouble.  What happens before the procedure? Staying hydrated Follow instructions from your health care provider about hydration, which may include:  Up to 2 hours before the procedure - you may continue to drink clear liquids, such as water, clear fruit juice, black coffee, and plain  tea.  Eating and drinking restrictions Follow instructions from your health care provider about eating and drinking, which may include:  8 hours before the procedure - stop eating heavy meals or foods such as meat, fried foods, or fatty foods.  6 hours before the procedure - stop eating light meals or foods, such as toast or cereal.  6 hours before the procedure - stop drinking milk or drinks that contain milk.  2 hours before the procedure - stop drinking clear liquids.  Medicine  Ask your health care provider about:  Changing or stopping your regular medicines. This is especially important if you are taking diabetes medicines or blood thinners.  Taking medicines such as aspirin and ibuprofen. These medicines can thin your blood. Do not take these medicines before your procedure if your health care provider instructs you not to.  Tests and exams  You will have a physical exam.  You may have blood tests done to show: ? How well your kidneys and liver are working. ? How well your blood can clot. General instructions  Plan to have someone take you home from the hospital or clinic.  If you will be going home right after the procedure, plan to have someone with you for 24 hours. What happens during the procedure?  An IV tube  will be inserted into one of your veins.  Medicine to help you relax (sedative) will be given through the IV tube.  The medical or dental procedure will be performed. What happens after the procedure?  Your blood pressure, heart rate, breathing rate, and blood oxygen level will be monitored often until the medicines you were given have worn off.  Do not drive for 24 hours. This information is not intended to replace advice given to you by your health care provider. Make sure you discuss any questions you have with your health care provider. Document Released: 06/04/2001 Document Revised: 02/13/2016 Document Reviewed: 12/30/2015 Elsevier Interactive  Patient Education  2018 [employer]  What are the risk, side effects and possible complications? Generally speaking, most procedures are safe.  However, with any procedure there are risks, side effects, and the possibility of complications.  The risks and complications are dependent upon the sites that are lesioned, or the type of nerve block to be performed.  The closer the procedure is to the spine, the more serious the risks are.  Great care is taken when placing the radio frequency needles, block needles or lesioning probes, but sometimes complications can occur. 1. Infection: Any time there is an injection through the skin, there is a risk of infection.  This is why sterile conditions are used for these blocks.  There are four possible types of infection. 1. Localized skin infection. 2. Central Nervous System Infection-This can be in the form of Meningitis, which can be deadly. 3. Epidural Infections-This can be in the form of an epidural abscess, which can cause pressure inside of the spine, causing compression of the spinal cord with subsequent paralysis. This would require an emergency surgery to decompress, and there are no guarantees that the patient would recover from the paralysis. 4. Discitis-This is an infection of the intervertebral discs.  It occurs in about 1% of discography procedures.  It is difficult to treat and it may lead to surgery.        2. Pain: the needles have to go through skin and soft tissues, will cause soreness.       3. Damage to internal structures:  The nerves to be lesioned may be near blood vessels or    other nerves which can be potentially damaged.       4. Bleeding: Bleeding is more common if the patient is taking blood thinners such as  aspirin, Coumadin, Ticiid, Plavix, etc., or if he/she have some genetic predisposition  such as hemophilia. Bleeding into the spinal canal can cause compression of the spinal  cord with  subsequent paralysis.  This would require an emergency surgery to  decompress and there are no guarantees that the patient would recover from the  paralysis.       5. Pneumothorax:  Puncturing of a lung is a possibility, every time a needle is introduced in  the area of the chest or upper back.  Pneumothorax refers to free air around the  collapsed lung(s), inside of the thoracic cavity (chest cavity).  Another two possible  complications related to a similar event would include: Hemothorax and Chylothorax.   These are variations of the Pneumothorax, where instead of air around the collapsed  lung(s), you may have blood or chyle, respectively.       6. Spinal headaches: They may occur with any procedures in the area of the spine.       7. Persistent CSF (Cerebro-Spinal  Fluid) leakage: This is a rare problem, but may occur  with prolonged intrathecal or epidural catheters either due to the formation of a fistulous  track or a dural tear.       8. Nerve damage: By working so close to the spinal cord, there is always a possibility of  nerve damage, which could be as serious as a permanent spinal cord injury with  paralysis.       9. Death:  Although rare, severe deadly allergic reactions known as "Anaphylactic  reaction" can occur to any of the medications used.      10. Worsening of the symptoms:  We can always make thing worse.  What are the chances of something like this happening? Chances of any of this occuring are extremely low.  By statistics, you have more of a chance of getting killed in a motor vehicle accident: while driving to the hospital than any of the above occurring .  Nevertheless, you should be aware that they are possibilities.  In general, it is similar to taking a shower.  Everybody knows that you can slip, hit your head and get killed.  Does that mean that you should not shower again?  Nevertheless always keep in mind that statistics do not mean anything if you happen to be on the wrong  side of them.  Even if a procedure has a 1 (one) in a 1,000,000 (million) chance of going wrong, it you happen to be that one..Also, keep in mind that by statistics, you have more of a chance of having something go wrong when taking medications.  Who should not have this procedure? If you are on a blood thinning medication (e.g. Coumadin, Plavix, see list of "Blood Thinners"), or if you have an active infection going on, you should not have the procedure.  If you are taking any blood thinners, please inform your physician.  How should I prepare for this procedure?  Do not eat or drink anything at least six hours prior to the procedure.  Bring a driver with you .  It cannot be a taxi.  Come accompanied by an adult that can drive you back, and that is strong enough to help you if your legs get weak or numb from the local anesthetic.  Take all of your medicines the morning of the procedure with just enough water to swallow them.  If you have diabetes, make sure that you are scheduled to have your procedure done first thing in the morning, whenever possible.  If you have diabetes, take only half of your insulin dose and notify our nurse that you have done so as soon as you arrive at the clinic.  If you are diabetic, but only take blood sugar pills (oral hypoglycemic), then do not take them on the morning of your procedure.  You may take them after you have had the procedure.  Do not take aspirin or any aspirin-containing medications, at least eleven (11) days prior to the procedure.  They may prolong bleeding.  Wear loose fitting clothing that may be easy to take off and that you would not mind if it got stained with Betadine or blood.  Do not wear any jewelry or perfume  Remove any nail coloring.  It will interfere with some of our monitoring equipment.  NOTE: Remember that this is not meant to be interpreted as a complete list of all possible complications.  Unforeseen problems may  occur.  BLOOD THINNERS The following drugs contain  aspirin or other products, which can cause increased bleeding during surgery and should not be taken for 2 weeks prior to and 1 week after surgery.  If you should need take something for relief of minor pain, you may take acetaminophen which is found in Tylenol,m Datril, Anacin-3 and Panadol. It is not blood thinner. The products listed below are.  Do not take any of the products listed below in addition to any listed on your instruction sheet.  A.P.C or A.P.C with Codeine Codeine Phosphate Capsules #3 Ibuprofen Ridaura  ABC compound Congesprin Imuran rimadil  Advil Cope Indocin Robaxisal  Alka-Seltzer Effervescent Pain Reliever and Antacid Coricidin or Coricidin-D  Indomethacin Rufen  Alka-Seltzer plus Cold Medicine Cosprin Ketoprofen S-A-C Tablets  Anacin Analgesic Tablets or Capsules Coumadin Korlgesic Salflex  Anacin Extra Strength Analgesic tablets or capsules CP-2 Tablets Lanoril Salicylate  Anaprox Cuprimine Capsules Levenox Salocol  Anexsia-D Dalteparin Magan Salsalate  Anodynos Darvon compound Magnesium Salicylate Sine-off  Ansaid Dasin Capsules Magsal Sodium Salicylate  Anturane Depen Capsules Marnal Soma  APF Arthritis pain formula Dewitt's Pills Measurin Stanback  Argesic Dia-Gesic Meclofenamic Sulfinpyrazone  Arthritis Bayer Timed Release Aspirin Diclofenac Meclomen Sulindac  Arthritis pain formula Anacin Dicumarol Medipren Supac  Analgesic (Safety coated) Arthralgen Diffunasal Mefanamic Suprofen  Arthritis Strength Bufferin Dihydrocodeine Mepro Compound Suprol  Arthropan liquid Dopirydamole Methcarbomol with Aspirin Synalgos  ASA tablets/Enseals Disalcid Micrainin Tagament  Ascriptin Doan's Midol Talwin  Ascriptin A/D Dolene Mobidin Tanderil  Ascriptin Extra Strength Dolobid Moblgesic Ticlid  Ascriptin with Codeine Doloprin or Doloprin with Codeine Momentum Tolectin  Asperbuf Duoprin Mono-gesic Trendar  Aspergum Duradyne  Motrin or Motrin IB Triminicin  Aspirin plain, buffered or enteric coated Durasal Myochrisine Trigesic  Aspirin Suppositories Easprin Nalfon Trillsate  Aspirin with Codeine Ecotrin Regular or Extra Strength Naprosyn Uracel  Atromid-S Efficin Naproxen Ursinus  Auranofin Capsules Elmiron Neocylate Vanquish  Axotal Emagrin Norgesic Verin  Azathioprine Empirin or Empirin with Codeine Normiflo Vitamin E  Azolid Emprazil Nuprin Voltaren  Bayer Aspirin plain, buffered or children's or timed BC Tablets or powders Encaprin Orgaran Warfarin Sodium  Buff-a-Comp Enoxaparin Orudis Zorpin  Buff-a-Comp with Codeine Equegesic Os-Cal-Gesic   Buffaprin Excedrin plain, buffered or Extra Strength Oxalid   Bufferin Arthritis Strength Feldene Oxphenbutazone   Bufferin plain or Extra Strength Feldene Capsules Oxycodone with Aspirin   Bufferin with Codeine Fenoprofen Fenoprofen Pabalate or Pabalate-SF   Buffets II Flogesic Panagesic   Buffinol plain or Extra Strength Florinal or Florinal with Codeine Panwarfarin   Buf-Tabs Flurbiprofen Penicillamine   Butalbital Compound Four-way cold tablets Penicillin   Butazolidin Fragmin Pepto-Bismol   Carbenicillin Geminisyn Percodan   Carna Arthritis Reliever Geopen Persantine   Carprofen Gold's salt Persistin   Chloramphenicol Goody's Phenylbutazone   Chloromycetin Haltrain Piroxlcam   Clmetidine heparin Plaquenil   Cllnoril Hyco-pap Ponstel   Clofibrate Hydroxy chloroquine Propoxyphen         Before stopping any of these medications, be sure to consult the physician who ordered them.  Some, such as Coumadin (Warfarin) are ordered to prevent or treat serious conditions such as "deep thrombosis", "pumonary embolisms", and other heart problems.  The amount of time that you may need off of the medication may also vary with the medication and the reason for which you were taking it.  If you are taking any of these medications, please make sure you notify your pain  physician before you undergo any procedures.

## 2018-08-13 NOTE — Progress Notes (Signed)
Safety precautions to be maintained throughout the outpatient stay will include: orient to surroundings, keep bed in low position, maintain call bell within reach at all times, provide assistance with transfer out of bed and ambulation.  

## 2018-08-13 NOTE — Progress Notes (Signed)
Patient's Name: Cheyenne Walker  MRN: [id number]  Referring Provider: Steele Sizer, MD  DOB: 1971/01/19  PCP: Steele Sizer, MD  DOS: 08/13/2018  Note by: Gillis Santa, MD  Service setting: Ambulatory outpatient  Specialty: Interventional Pain Management  Location: [facility] (AMB) Pain Management Facility    Patient type: Established   Primary Reason(s) for Visit: Evaluation of chronic illnesses with exacerbation, or progression (Level of risk: moderate) CC: Back Pain (lower) and Leg Pain (left)  HPI  Cheyenne Walker is a 47 y.o. year old, female patient, who comes today for a follow-up evaluation. She has Anxiety; Benign essential HTN; Allergic rhinitis; Gastro-esophageal reflux disease without esophagitis; Major depressive episode; Morbid obesity with BMI of 40.0-44.9, adult ([place]); Dysmetabolic syndrome; Numerous moles; Plantar fasciitis; PTSD (post-traumatic stress disorder); Vitamin D deficiency; Umbilical hernia without obstruction and without gangrene; and DDD (degenerative disc disease), lumbosacral on their problem list. Cheyenne Walker was last seen on 05/07/2018. Her primarily concern today is the Back Pain (lower) and Leg Pain (left)  Pain Assessment: Location: Lower Back Radiating: through left hip down side of left leg to shin Onset: More than a month ago Duration: Chronic pain Quality: Burning, Tingling Severity: 8 /10 (subjective, self-reported pain score)  Note: Reported level is inconsistent with clinical observations.                         When using our objective Pain Scale, levels between 6 and 10/10 are said to belong in an emergency room, as it progressively worsens from a 6/10, described as severely limiting, requiring emergency care not usually available at an outpatient pain management facility. At a 6/10 level, communication becomes difficult and requires great effort. Assistance to reach the emergency department may be required. Facial flushing and profuse sweating along with  potentially dangerous increases in heart rate and blood pressure will be evident. Effect on ADL:   Timing: Constant Modifying factors: ibuprophen BP: (!) 147/77  HR: (!) 59  Further details on both, my assessment(s), as well as the proposed treatment plan, please see below.  Patient follows up today with return of axial low back pain that radiates down her left leg in a dermatomal fashion.  Patient is status post L5-S1 epidural steroid injection on 04/15/2018 which helped with her pain in her functional status.  She states that her pain is worse in the morning when she wakes up.  We discussed application of her heat to low back as well as morning stretching.  For her radicular pain, we discussed repeating lumbar epidural steroid injection at L5-S1.  Risks and benefits were discussed and patient would like to proceed.  Laboratory Chemistry  Inflammation Markers (CRP: Acute Phase) (ESR: Chronic Phase) No results found for: CRP, ESRSEDRATE, LATICACIDVEN                       Rheumatology Markers No results found for: RF, ANA, LABURIC, URICUR, LYMEIGGIGMAB, LYMEABIGMQN, HLAB27                      Renal Function Markers Lab Results  Component Value Date   BUN 20 10/31/2017   CREATININE 0.84 30/01/1101   BCR NOT APPLICABLE 08/09/3566   GFRAA >60 10/15/2016   GFRNONAA >60 10/15/2016                             Hepatic Function Markers Lab Results  Component Value Date   AST 22 10/31/2017   ALT 25 10/31/2017   ALBUMIN 3.8 10/28/2014   ALKPHOS 62 10/28/2014                        Electrolytes Lab Results  Component Value Date   NA 140 10/31/2017   K 3.7 10/31/2017   CL 104 10/31/2017   CALCIUM 9.6 10/31/2017                        Neuropathy Markers Lab Results  Component Value Date   VITAMINB12 418 10/31/2017   HGBA1C 5.0 10/31/2017   HIV NONREACTIVE 01/06/2017                        CNS Tests No results found for: COLORCSF, APPEARCSF, RBCCOUNTCSF, WBCCSF, POLYSCSF,  LYMPHSCSF, EOSCSF, PROTEINCSF, GLUCCSF, JCVIRUS, CSFOLI, IGGCSF                      Bone Pathology Markers Lab Results  Component Value Date   VD25OH 20 (L) 10/31/2017                         Coagulation Parameters Lab Results  Component Value Date   PLT 380 10/31/2017                        Cardiovascular Markers Lab Results  Component Value Date   TROPONINI <0.03 10/15/2016   HGB 13.7 10/31/2017   HCT 41.1 10/31/2017                         CA Markers No results found for: CEA, CA125, LABCA2                      Note: Lab results reviewed.  Lumbosacral Imaging: Lumbar MR wo contrast:  Results for orders placed during the hospital encounter of 01/24/17  MR LUMBAR SPINE WO CONTRAST   Narrative CLINICAL DATA:  47 year old female with 6 months of lumbar back pain radiating down the left leg with numbness. Symptoms increase at night. No known injury.  EXAM: MRI LUMBAR SPINE WITHOUT CONTRAST  TECHNIQUE: Multiplanar, multisequence MR imaging of the lumbar spine was performed. No intravenous contrast was administered.  COMPARISON:  CT Abdomen and Pelvis 10/28/2014  FINDINGS: Segmentation:  Normal is demonstrated on the comparison CT.  Alignment: Stable since 2016. Subtle retrolisthesis of L5 on S1. Mild straightening of upper lumbar lordosis.  Vertebrae: Degenerative mild endplate marrow edema at L5-S1. Elsewhere bone marrow signal is within normal limits. No other acute osseous abnormality identified. Negative visible sacrum.  Conus medullaris: Extends to the T12-L1 level and appears normal.  Paraspinal and other soft tissues: Negative.  Disc levels:  T11-T12:  Mild facet hypertrophy.  T12-L1: Mild disc desiccation. Mild circumferential disc bulge with superimposed small broad-based central disc protrusion. No significant stenosis.  L1-L2: Disc desiccation. Mild but anterior eccentric circumferential disc bulge. Mild endplate spurring. Mild facet  hypertrophy. Borderline to mild bilateral L1 foraminal stenosis.  L2-L3: Mild disc desiccation. Mild circumferential disc bulge with subtle superimposed central disc protrusion with annular fissure (series 5, image 17). No stenosis.  L3-L4:  Mild facet hypertrophy.  L4-L5: Mild disc desiccation. Small broad-based central disc protrusion with subtle annular fissure. Borderline to mild facet hypertrophy. No stenosis.  L5-S1: Chronic disc space loss and vacuum disc. Circumferential disc osteophyte complex with broad-based posterior component. Mild facet hypertrophy. Moderate left and mild right L5 foraminal stenosis appears chronic and stable.  IMPRESSION: 1. Lumbar disc degeneration except at L3-L4. Intermittent small disc herniations, but no lumbar spinal stenosis. 2. Chronic disc and acute on chronic endplate degeneration at L5-S1 contributes to moderate left and mild right L5 neural foraminal stenosis which appears stable since the 2016 CT Abdomen and Pelvis. Query left L5 radiculitis.   Electronically Signed   By: Genevie Ann M.D.   On: 01/24/2017 15:58     Complexity Note: Imaging results reviewed. Results shared with Ms. Market, using Layman's terms.                         Meds   Current Outpatient Medications:  .  Ibuprofen 200 MG CAPS, Take 600 mg by mouth every morning., Disp: , Rfl:  .  triamterene-hydrochlorothiazide (MAXZIDE-25) 37.5-25 MG tablet, Take 1 tablet by mouth daily., Disp: 90 tablet, Rfl: 1 .  fluticasone (FLONASE) 50 MCG/ACT nasal spray, SPRAY 2 SPRAYS (100 MCG) IN EACH NOSTRIL BY INTRANASAL ROUTE ONCE DAILY, Disp: , Rfl: 4 .  gabapentin (NEURONTIN) 300 MG capsule, Take 1 capsule (300 mg total) by mouth at bedtime., Disp: 30 capsule, Rfl: 11 .  tiZANidine (ZANAFLEX) 4 MG tablet, Take 1-2 tablets (4-8 mg total) by mouth at bedtime., Disp: 60 tablet, Rfl: 11  ROS  Constitutional: Denies any fever or chills Gastrointestinal: No reported hemesis,  hematochezia, vomiting, or acute GI distress Musculoskeletal: Denies any acute onset joint swelling, redness, loss of ROM, or weakness Neurological: No reported episodes of acute onset apraxia, aphasia, dysarthria, agnosia, amnesia, paralysis, loss of coordination, or loss of consciousness  Allergies  Ms. Mettler is allergic to ace inhibitors.  PFSH  Drug: Ms. Holle  reports that she does not use drugs. Alcohol:  reports that she does not drink alcohol. Tobacco:  reports that she has never smoked. She has never used smokeless tobacco. Medical:  has a past medical history of Hypertension. Surgical: Ms. Dileo  has a past surgical history that includes Hernia repair (10/2014) and Cholecystectomy (2006). Family: family history includes Hypertension in her brother, mother, and sister.  Constitutional Exam  General appearance: Well nourished, well developed, and well hydrated. In no apparent acute distress Vitals:   08/13/18 0835  BP: (!) 147/77  Pulse: (!) 59  Resp: 16  Temp: 98 F (36.7 C)  TempSrc: Oral  SpO2: 100%  Weight: 220 lb (99.8 kg)  Height: _0  (1.6 m)   BMI Assessment: Estimated body mass index is 38.97 kg/m as calculated from the following:   Height as of this encounter: _1  (1.6 m).   Weight as of this encounter: 220 lb (99.8 kg).  BMI interpretation table: BMI level Category Range association with higher incidence of chronic pain  <18 kg/m2 Underweight   18.5-24.9 kg/m2 Ideal body weight   25-29.9 kg/m2 Overweight Increased incidence by 20%  30-34.9 kg/m2 Obese (Class I) Increased incidence by 68%  35-39.9 kg/m2 Severe obesity (Class II) Increased incidence by 136%  >40 kg/m2 Extreme obesity (Class III) Increased incidence by 254%   Patient's current BMI Ideal Body weight  Body mass index is 38.97 kg/m. Ideal body weight: 52.4 kg (115 lb 8.3 oz) Adjusted ideal body weight: 71.4 kg (157 lb 5 oz)   BMI Readings from Last 4 Encounters:  08/13/18 38.97 kg/m  05/07/18 44.63 kg/m  04/15/18 42.51 kg/m  04/06/18 43.93 kg/m   Wt Readings from Last 4 Encounters:  08/13/18 220 lb (99.8 kg)  05/07/18 260 lb (117.9 kg)  04/15/18 240 lb (108.9 kg)  04/06/18 248 lb (112.5 kg)  Psych/Mental status: Alert, oriented x 3 (person, place, & time)       Eyes: PERLA Respiratory: No evidence of acute respiratory distress  Cervical Spine Area Exam  Skin & Axial Inspection: No masses, redness, edema, swelling, or associated skin lesions Alignment: Symmetrical Functional ROM: Unrestricted ROM      Stability: No instability detected Muscle Tone/Strength: Functionally intact. No obvious neuro-muscular anomalies detected. Sensory (Neurological): Unimpaired Palpation: No palpable anomalies              Upper Extremity (UE) Exam    Side: Right upper extremity  Side: Left upper extremity  Skin & Extremity Inspection: Skin color, temperature, and hair growth are WNL. No peripheral edema or cyanosis. No masses, redness, swelling, asymmetry, or associated skin lesions. No contractures.  Skin & Extremity Inspection: Skin color, temperature, and hair growth are WNL. No peripheral edema or cyanosis. No masses, redness, swelling, asymmetry, or associated skin lesions. No contractures.  Functional ROM: Unrestricted ROM          Functional ROM: Unrestricted ROM          Muscle Tone/Strength: Functionally intact. No obvious neuro-muscular anomalies detected.  Muscle Tone/Strength: Functionally intact. No obvious neuro-muscular anomalies detected.  Sensory (Neurological): Unimpaired          Sensory (Neurological): Unimpaired          Palpation: No palpable anomalies              Palpation: No palpable anomalies              Provocative Test(s):  Phalen's test: deferred Tinel's test: deferred Apley's scratch test (touch opposite shoulder):  Action 1 (Across chest): deferred Action 2 (Overhead): deferred Action 3 (LB reach): deferred   Provocative Test(s):  Phalen's  test: deferred Tinel's test: deferred Apley's scratch test (touch opposite shoulder):  Action 1 (Across chest): deferred Action 2 (Overhead): deferred Action 3 (LB reach): deferred    Thoracic Spine Area Exam  Skin & Axial Inspection: No masses, redness, or swelling Alignment: Symmetrical Functional ROM: Unrestricted ROM Stability: No instability detected Muscle Tone/Strength: Functionally intact. No obvious neuro-muscular anomalies detected. Sensory (Neurological): Unimpaired Muscle strength & Tone: No palpable anomalies  Lumbar Spine Area Exam  Skin & Axial Inspection: No masses, redness, or swelling Alignment: Symmetrical Functional ROM: Decreased ROM       Stability: No instability detected Muscle Tone/Strength: Functionally intact. No obvious neuro-muscular anomalies detected. Sensory (Neurological): Dermatomal pain pattern Palpation: No palpable anomalies       Provocative Tests: Hyperextension/rotation test: deferred today       Lumbar quadrant test (Kemp's test): deferred today       Lateral bending test: (+) ipsilateral radicular pain, on the left. Positive for left-sided foraminal stenosis. Patrick's Maneuver: deferred today                   FABER test: deferred today                   S-I anterior distraction/compression test: deferred today         S-I lateral compression test: deferred today         S-I Thigh-thrust test: deferred today  S-I Gaenslen's test: deferred today          Gait & Posture Assessment  Ambulation: Unassisted Gait: Relatively normal for age and body habitus Posture: WNL   Lower Extremity Exam    Side: Right lower extremity  Side: Left lower extremity  Stability: No instability observed          Stability: No instability observed          Skin & Extremity Inspection: Skin color, temperature, and hair growth are WNL. No peripheral edema or cyanosis. No masses, redness, swelling, asymmetry, or associated skin lesions. No  contractures.  Skin & Extremity Inspection: Skin color, temperature, and hair growth are WNL. No peripheral edema or cyanosis. No masses, redness, swelling, asymmetry, or associated skin lesions. No contractures.  Functional ROM: Unrestricted ROM                  Functional ROM: Unrestricted ROM                  Muscle Tone/Strength: Functionally intact. No obvious neuro-muscular anomalies detected.  Muscle Tone/Strength: Functionally intact. No obvious neuro-muscular anomalies detected.  Sensory (Neurological): Unimpaired        Sensory (Neurological): Dermatomal pain pattern top of foot & big toe (L5)  DTR: Patellar: deferred today Achilles: deferred today Plantar: deferred today  DTR: Patellar: deferred today Achilles: deferred today Plantar: deferred today  Palpation: No palpable anomalies  Palpation: No palpable anomalies   Assessment  Primary Diagnosis & Pertinent Problem List: The primary encounter diagnosis was Lumbar radiculopathy (L5). Diagnoses of Morbid obesity ([place]) and Chronic pain syndrome were also pertinent to this visit.  Status Diagnosis  Having a Flare-up Persistent Persistent 1. Lumbar radiculopathy (L5)   2. Morbid obesity ([place])   3. Chronic pain syndrome      General Recommendations: The pain condition that the patient suffers from is best treated with a multidisciplinary approach that involves an increase in physical activity to prevent de-conditioning and worsening of the pain cycle, as well as psychological counseling (formal and/or informal) to address the co-morbid psychological affects of pain. Treatment will often involve judicious use of pain medications and interventional procedures to decrease the pain, allowing the patient to participate in the physical activity that will ultimately produce long-lasting pain reductions. The goal of the multidisciplinary approach is to return the patient to a higher level of overall function and to restore their ability to  perform activities of daily living.  Patient follows up today with return of axial low back pain that radiates down her left leg in a dermatomal fashion.  Patient is status post L5-S1 epidural steroid injection on 04/15/2018 which helped with her pain in her functional status.  She states that her pain is worse in the morning when she wakes up.  We discussed application of her heat to low back as well as morning stretching.  For her radicular pain, we discussed repeating lumbar epidural steroid injection at L5-S1.  Risks and benefits were discussed and patient would like to proceed.  I will also refill the patient's gabapentin and tizanidine as below.  Plan of Care  Pharmacotherapy (Medications Ordered): Meds ordered this encounter  Medications  . gabapentin (NEURONTIN) 300 MG capsule    Sig: Take 1 capsule (300 mg total) by mouth at bedtime.    Dispense:  30 capsule    Refill:  11  . tiZANidine (ZANAFLEX) 4 MG tablet    Sig: Take 1-2 tablets (4-8 mg total) by  mouth at bedtime.    Dispense:  60 tablet    Refill:  11   Lab-work, procedure(s), and/or referral(s): Orders Placed This Encounter  Procedures  . Lumbar Epidural Injection    Provider-requested follow-up: Return for Procedure.  Primary Care Physician: Steele Sizer, MD Location: [facility] Note by: Gillis Santa, M.D Date: 08/13/2018; Time: 9:29 AM  Patient Instructions   Gabapentin and Tizanidine have been escribed to your pharmacy.  Epidural Steroid Injection Patient Information  Description: The epidural space surrounds the nerves as they exit the spinal cord.  In some patients, the nerves can be compressed and inflamed by a bulging disc or a tight spinal canal (spinal stenosis).  By injecting steroids into the epidural space, we can bring irritated nerves into direct contact with a potentially helpful medication.  These steroids act directly on the irritated nerves and can reduce  swelling and inflammation which often leads to decreased pain.  Epidural steroids may be injected anywhere along the spine and from the neck to the low back depending upon the location of your pain.   After numbing the skin with local anesthetic (like Novocaine), a small needle is passed into the epidural space slowly.  You may experience a sensation of pressure while this is being done.  The entire block usually last less than 10 minutes.  Conditions which may be treated by epidural steroids:   Low back and leg pain  Neck and arm pain  Spinal stenosis  Post-laminectomy syndrome  Herpes zoster (shingles) pain  Pain from compression fractures  Preparation for the injection:  1. Do not eat any solid food or dairy products within 8 hours of your appointment.  2. You may drink clear liquids up to 3 hours before appointment.  Clear liquids include water, black coffee, juice or soda.  No milk or cream please. 3. You may take your regular medication, including pain medications, with a sip of water before your appointment  Diabetics should hold regular insulin (if taken separately) and take 1/2 normal NPH dos the morning of the procedure.  Carry some sugar containing items with you to your appointment. 4. A driver must accompany you and be prepared to drive you home after your procedure.  5. Bring all your current medications with your. 6. An IV may be inserted and sedation may be given at the discretion of the physician.   7. A blood pressure cuff, EKG and other monitors will often be applied during the procedure.  Some patients may need to have extra oxygen administered for a short period. 8. You will be asked to provide medical information, including your allergies, prior to the procedure.  We must know immediately if you are taking blood thinners (like Coumadin/Warfarin)  Or if you are allergic to IV iodine contrast (dye). We must know if you could possible be pregnant.  Possible  side-effects:  Bleeding from needle site  Infection (rare, may require surgery)  Nerve injury (rare)  Numbness & tingling (temporary)  Difficulty urinating (rare, temporary)  Spinal headache ( a headache worse with upright posture)  Light -headedness (temporary)  Pain at injection site (several days)  Decreased blood pressure (temporary)  Weakness in arm/leg (temporary)  Pressure sensation in back/neck (temporary)  Call if you experience:  Fever/chills associated with headache or increased back/neck pain.  Headache worsened by an upright position.  New onset weakness or numbness of an extremity below the injection site  Hives or difficulty breathing (go to the  emergency room)  Inflammation or drainage at the infection site  Severe back/neck pain  Any new symptoms which are concerning to you  Please note:  Although the local anesthetic injected can often make your back or neck feel good for several hours after the injection, the pain will likely return.  It takes 3-7 days for steroids to work in the epidural space.  You may not notice any pain relief for at least that one week.  If effective, we will often do a series of three injections spaced 3-6 weeks apart to maximally decrease your pain.  After the initial series, we generally will wait several months before considering a repeat injection of the same type.  If you have any questions, please call [phone] [facility]  Moderate Conscious Sedation, Adult Sedation is the use of medicines to promote relaxation and relieve discomfort and anxiety. Moderate conscious sedation is a type of sedation. Under moderate conscious sedation, you are less alert than normal, but you are still able to respond to instructions, touch, or both. Moderate conscious sedation is used during short medical and dental procedures. It is milder than deep sedation, which is a type of sedation under which  you cannot be easily woken up. It is also milder than general anesthesia, which is the use of medicines to make you unconscious. Moderate conscious sedation allows you to return to your regular activities sooner. Tell a health care provider about:  Any allergies you have.  All medicines you are taking, including vitamins, herbs, eye drops, creams, and over-the-counter medicines.  Use of steroids (by mouth or creams).  Any problems you or family members have had with sedatives and anesthetic medicines.  Any blood disorders you have.  Any surgeries you have had.  Any medical conditions you have, such as sleep apnea.  Whether you are pregnant or may be pregnant.  Any use of cigarettes, alcohol, marijuana, or street drugs. What are the risks? Generally, this is a safe procedure. However, problems may occur, including:  Getting too much medicine (oversedation).  Nausea.  Allergic reaction to medicines.  Trouble breathing. If this happens, a breathing tube may be used to help with breathing. It will be removed when you are awake and breathing on your own.  Heart trouble.  Lung trouble.  What happens before the procedure? Staying hydrated Follow instructions from your health care provider about hydration, which may include:  Up to 2 hours before the procedure - you may continue to drink clear liquids, such as water, clear fruit juice, black coffee, and plain tea.  Eating and drinking restrictions Follow instructions from your health care provider about eating and drinking, which may include:  8 hours before the procedure - stop eating heavy meals or foods such as meat, fried foods, or fatty foods.  6 hours before the procedure - stop eating light meals or foods, such as toast or cereal.  6 hours before the procedure - stop drinking milk or drinks that contain milk.  2 hours before the procedure - stop drinking clear liquids.  Medicine  Ask your health care provider  about:  Changing or stopping your regular medicines. This is especially important if you are taking diabetes medicines or blood thinners.  Taking medicines such as aspirin and ibuprofen. These medicines can thin your blood. Do not take these medicines before your procedure if your health care provider instructs you not to.  Tests and exams  You will have a physical  exam.  You may have blood tests done to show: ? How well your kidneys and liver are working. ? How well your blood can clot. General instructions  Plan to have someone take you home from the hospital or clinic.  If you will be going home right after the procedure, plan to have someone with you for 24 hours. What happens during the procedure?  An IV tube will be inserted into one of your veins.  Medicine to help you relax (sedative) will be given through the IV tube.  The medical or dental procedure will be performed. What happens after the procedure?  Your blood pressure, heart rate, breathing rate, and blood oxygen level will be monitored often until the medicines you were given have worn off.  Do not drive for 24 hours. This information is not intended to replace advice given to you by your health care provider. Make sure you discuss any questions you have with your health care provider. Document Released: 06/04/2001 Document Revised: 02/13/2016 Document Reviewed: 12/30/2015 Elsevier Interactive Patient Education  2018 [place]  What are the risk, side effects and possible complications? Generally speaking, most procedures are safe.  However, with any procedure there are risks, side effects, and the possibility of complications.  The risks and complications are dependent upon the sites that are lesioned, or the type of nerve block to be performed.  The closer the procedure is to the spine, the more serious the risks are.  Great care is taken when placing the radio frequency  needles, block needles or lesioning probes, but sometimes complications can occur. 1. Infection: Any time there is an injection through the skin, there is a risk of infection.  This is why sterile conditions are used for these blocks.  There are four possible types of infection. 1. Localized skin infection. 2. Central Nervous System Infection-This can be in the form of Meningitis, which can be deadly. 3. Epidural Infections-This can be in the form of an epidural abscess, which can cause pressure inside of the spine, causing compression of the spinal cord with subsequent paralysis. This would require an emergency surgery to decompress, and there are no guarantees that the patient would recover from the paralysis. 4. Discitis-This is an infection of the intervertebral discs.  It occurs in about 1% of discography procedures.  It is difficult to treat and it may lead to surgery.        2. Pain: the needles have to go through skin and soft tissues, will cause soreness.       3. Damage to internal structures:  The nerves to be lesioned may be near blood vessels or    other nerves which can be potentially damaged.       4. Bleeding: Bleeding is more common if the patient is taking blood thinners such as  aspirin, Coumadin, Ticiid, Plavix, etc., or if he/she have some genetic predisposition  such as hemophilia. Bleeding into the spinal canal can cause compression of the spinal  cord with subsequent paralysis.  This would require an emergency surgery to  decompress and there are no guarantees that the patient would recover from the  paralysis.       5. Pneumothorax:  Puncturing of a lung is a possibility, every time a needle is introduced in  the area of the chest or upper back.  Pneumothorax refers to free air around the  collapsed lung(s), inside of the thoracic cavity (chest cavity).  Another  two possible  complications related to a similar event would include: Hemothorax and Chylothorax.   These are variations  of the Pneumothorax, where instead of air around the collapsed  lung(s), you may have blood or chyle, respectively.       6. Spinal headaches: They may occur with any procedures in the area of the spine.       7. Persistent CSF (Cerebro-Spinal Fluid) leakage: This is a rare problem, but may occur  with prolonged intrathecal or epidural catheters either due to the formation of a fistulous  track or a dural tear.       8. Nerve damage: By working so close to the spinal cord, there is always a possibility of  nerve damage, which could be as serious as a permanent spinal cord injury with  paralysis.       9. Death:  Although rare, severe deadly allergic reactions known as "Anaphylactic  reaction" can occur to any of the medications used.      10. Worsening of the symptoms:  We can always make thing worse.  What are the chances of something like this happening? Chances of any of this occuring are extremely low.  By statistics, you have more of a chance of getting killed in a motor vehicle accident: while driving to the hospital than any of the above occurring .  Nevertheless, you should be aware that they are possibilities.  In general, it is similar to taking a shower.  Everybody knows that you can slip, hit your head and get killed.  Does that mean that you should not shower again?  Nevertheless always keep in mind that statistics do not mean anything if you happen to be on the wrong side of them.  Even if a procedure has a 1 (one) in a 1,000,000 (million) chance of going wrong, it you happen to be that one..Also, keep in mind that by statistics, you have more of a chance of having something go wrong when taking medications.  Who should not have this procedure? If you are on a blood thinning medication (e.g. Coumadin, Plavix, see list of "Blood Thinners"), or if you have an active infection going on, you should not have the procedure.  If you are taking any blood thinners, please inform your  physician.  How should I prepare for this procedure?  Do not eat or drink anything at least six hours prior to the procedure.  Bring a driver with you .  It cannot be a taxi.  Come accompanied by an adult that can drive you back, and that is strong enough to help you if your legs get weak or numb from the local anesthetic.  Take all of your medicines the morning of the procedure with just enough water to swallow them.  If you have diabetes, make sure that you are scheduled to have your procedure done first thing in the morning, whenever possible.  If you have diabetes, take only half of your insulin dose and notify our nurse that you have done so as soon as you arrive at the clinic.  If you are diabetic, but only take blood sugar pills (oral hypoglycemic), then do not take them on the morning of your procedure.  You may take them after you have had the procedure.  Do not take aspirin or any aspirin-containing medications, at least eleven (11) days prior to the procedure.  They may prolong bleeding.  Wear loose fitting clothing that may be easy to take  off and that you would not mind if it got stained with Betadine or blood.  Do not wear any jewelry or perfume  Remove any nail coloring.  It will interfere with some of our monitoring equipment.  NOTE: Remember that this is not meant to be interpreted as a complete list of all possible complications.  Unforeseen problems may occur.  BLOOD THINNERS The following drugs contain aspirin or other products, which can cause increased bleeding during surgery and should not be taken for 2 weeks prior to and 1 week after surgery.  If you should need take something for relief of minor pain, you may take acetaminophen which is found in Tylenol,m Datril, Anacin-3 and Panadol. It is not blood thinner. The products listed below are.  Do not take any of the products listed below in addition to any listed on your instruction sheet.  A.P.C or A.P.C with  Codeine Codeine Phosphate Capsules #3 Ibuprofen Ridaura  ABC compound Congesprin Imuran rimadil  Advil Cope Indocin Robaxisal  Alka-Seltzer Effervescent Pain Reliever and Antacid Coricidin or Coricidin-D  Indomethacin Rufen  Alka-Seltzer plus Cold Medicine Cosprin Ketoprofen S-A-C Tablets  Anacin Analgesic Tablets or Capsules Coumadin Korlgesic Salflex  Anacin Extra Strength Analgesic tablets or capsules CP-2 Tablets Lanoril Salicylate  Anaprox Cuprimine Capsules Levenox Salocol  Anexsia-D Dalteparin Magan Salsalate  Anodynos Darvon compound Magnesium Salicylate Sine-off  Ansaid Dasin Capsules Magsal Sodium Salicylate  Anturane Depen Capsules Marnal Soma  APF Arthritis pain formula Dewitt's Pills Measurin Stanback  Argesic Dia-Gesic Meclofenamic Sulfinpyrazone  Arthritis Bayer Timed Release Aspirin Diclofenac Meclomen Sulindac  Arthritis pain formula Anacin Dicumarol Medipren Supac  Analgesic (Safety coated) Arthralgen Diffunasal Mefanamic Suprofen  Arthritis Strength Bufferin Dihydrocodeine Mepro Compound Suprol  Arthropan liquid Dopirydamole Methcarbomol with Aspirin Synalgos  ASA tablets/Enseals Disalcid Micrainin Tagament  Ascriptin Doan's Midol Talwin  Ascriptin A/D Dolene Mobidin Tanderil  Ascriptin Extra Strength Dolobid Moblgesic Ticlid  Ascriptin with Codeine Doloprin or Doloprin with Codeine Momentum Tolectin  Asperbuf Duoprin Mono-gesic Trendar  Aspergum Duradyne Motrin or Motrin IB Triminicin  Aspirin plain, buffered or enteric coated Durasal Myochrisine Trigesic  Aspirin Suppositories Easprin Nalfon Trillsate  Aspirin with Codeine Ecotrin Regular or Extra Strength Naprosyn Uracel  Atromid-S Efficin Naproxen Ursinus  Auranofin Capsules Elmiron Neocylate Vanquish  Axotal Emagrin Norgesic Verin  Azathioprine Empirin or Empirin with Codeine Normiflo Vitamin E  Azolid Emprazil Nuprin Voltaren  Bayer Aspirin plain, buffered or children's or timed BC Tablets or powders  Encaprin Orgaran Warfarin Sodium  Buff-a-Comp Enoxaparin Orudis Zorpin  Buff-a-Comp with Codeine Equegesic Os-Cal-Gesic   Buffaprin Excedrin plain, buffered or Extra Strength Oxalid   Bufferin Arthritis Strength Feldene Oxphenbutazone   Bufferin plain or Extra Strength Feldene Capsules Oxycodone with Aspirin   Bufferin with Codeine Fenoprofen Fenoprofen Pabalate or Pabalate-SF   Buffets II Flogesic Panagesic   Buffinol plain or Extra Strength Florinal or Florinal with Codeine Panwarfarin   Buf-Tabs Flurbiprofen Penicillamine   Butalbital Compound Four-way cold tablets Penicillin   Butazolidin Fragmin Pepto-Bismol   Carbenicillin Geminisyn Percodan   Carna Arthritis Reliever Geopen Persantine   Carprofen Gold's salt Persistin   Chloramphenicol Goody's Phenylbutazone   Chloromycetin Haltrain Piroxlcam   Clmetidine heparin Plaquenil   Cllnoril Hyco-pap Ponstel   Clofibrate Hydroxy chloroquine Propoxyphen         Before stopping any of these medications, be sure to consult the physician who ordered them.  Some, such as Coumadin (Warfarin) are ordered to prevent or treat serious conditions such as "deep thrombosis", "pumonary embolisms",  and other heart problems.  The amount of time that you may need off of the medication may also vary with the medication and the reason for which you were taking it.  If you are taking any of these medications, please make sure you notify your pain physician before you undergo any procedures.

## 2018-08-31 ENCOUNTER — Ambulatory Visit (HOSPITAL_BASED_OUTPATIENT_CLINIC_OR_DEPARTMENT_OTHER): Payer: 59 | Admitting: Student in an Organized Health Care Education/Training Program

## 2018-08-31 ENCOUNTER — Encounter: Payer: Self-pay | Admitting: Student in an Organized Health Care Education/Training Program

## 2018-08-31 ENCOUNTER — Ambulatory Visit
Admission: RE | Admit: 2018-08-31 | Discharge: 2018-08-31 | Disposition: A | Discharge status: Home / Self Care | Payer: 59 | Source: Ambulatory Visit | Attending: Student in an Organized Health Care Education/Training Program | Admitting: Student in an Organized Health Care Education/Training Program

## 2018-08-31 DIAGNOSIS — M5416 Radiculopathy, lumbar region: Secondary | ICD-10-CM | POA: Insufficient documentation

## 2018-08-31 MED ORDER — IOPAMIDOL (ISOVUE-M 200) INJECTION 41%
10.0000 mL | Freq: Once | INTRAMUSCULAR | Status: AC
  Administered 2018-08-31: 10 mL via EPIDURAL
  Filled 2018-08-31: qty 10

## 2018-08-31 MED ORDER — ROPIVACAINE HCL 2 MG/ML IJ SOLN
2.0000 mL | Freq: Once | INTRAMUSCULAR | Status: AC
  Administered 2018-08-31: 10 mL via EPIDURAL
  Filled 2018-08-31: qty 10

## 2018-08-31 MED ORDER — DEXAMETHASONE SODIUM PHOSPHATE 10 MG/ML IJ SOLN
10.0000 mg | Freq: Once | INTRAMUSCULAR | Status: AC
  Administered 2018-08-31: 10 mg
  Filled 2018-08-31: qty 1

## 2018-08-31 MED ORDER — LIDOCAINE HCL 2 % IJ SOLN
10.0000 mL | Freq: Once | INTRAMUSCULAR | Status: AC
  Administered 2018-08-31: 400 mg
  Filled 2018-08-31: qty 20

## 2018-08-31 MED ORDER — SODIUM CHLORIDE (PF) 0.9 % IJ SOLN
INTRAMUSCULAR | Status: AC
  Filled 2018-08-31: qty 10

## 2018-08-31 MED ORDER — SODIUM CHLORIDE 0.9% FLUSH
2.0000 mL | Freq: Once | INTRAVENOUS | Status: AC
  Administered 2018-08-31: 10 mL

## 2018-08-31 NOTE — Progress Notes (Signed)
Safety precautions to be maintained throughout the outpatient stay will include: orient to surroundings, keep bed in low position, maintain call bell within reach at all times, provide assistance with transfer out of bed and ambulation.  

## 2018-08-31 NOTE — Progress Notes (Signed)
Patient's Name: Cheyenne Walker  MRN: [id number]  Referring Provider: Gillis Santa, MD  DOB: 1970/10/15  PCP: Steele Sizer, MD  DOS: 08/31/2018  Note by: Gillis Santa, MD  Service setting: Ambulatory outpatient  Specialty: Interventional Pain Management  Patient type: Established  Location: [facility] (AMB) Pain Management Facility  Visit type: Interventional Procedure   Primary Reason for Visit: Interventional Pain Management Treatment. CC: Back Pain (lumber bilateral )  Procedure:          Anesthesia, Analgesia, Anxiolysis:  Type: Therapeutic Inter-Laminar Epidural Steroid Injection #2  Region: Lumbar Level: L5-S1 Level. Laterality: Midline         Type: Local Anesthesia Indication(s): Analgesia         Route: Infiltration (Garretson/IM) IV Access: Declined Sedation: Declined  Local Anesthetic: Lidocaine 2%   Indications: 1. Lumbar radiculopathy (L5)    Pain Score: Pre-procedure: 3 /10 Post-procedure: 3 /10  Pre-op Assessment:  Cheyenne Walker is a 47 y.o. (year old), female patient, seen today for interventional treatment. She  has a past surgical history that includes Hernia repair (10/2014) and Cholecystectomy (2006). Cheyenne Walker has a current medication list which includes the following prescription(s): fluticasone, gabapentin, ibuprofen, tizanidine, and triamterene-hydrochlorothiazide. Her primarily concern today is the Back Pain (lumber bilateral )  Initial Vital Signs:  Pulse/HCG Rate: 63ECG Heart Rate: (!) 54 Temp: 98.5 F (36.9 C) Resp: 16 BP: 139/80 SpO2: 97 %  BMI: Estimated body mass index is 39.31 kg/m as calculated from the following:   Height as of this encounter: 5\' 4"  (1.626 m).   Weight as of this encounter: 229 lb (103.9 kg).  Risk Assessment: Allergies: Reviewed. She is allergic to ace inhibitors.  Allergy Precautions: None required Coagulopathies: Reviewed. None identified.  Blood-thinner therapy: None at this time Active Infection(s): Reviewed. None identified.  Cheyenne Walker is afebrile  Site Confirmation: Ms. August was asked to confirm the procedure and laterality before marking the site Procedure checklist: Completed Consent: Before the procedure and under the influence of no sedative(s), amnesic(s), or anxiolytics, the patient was informed of the treatment options, risks and possible complications. To fulfill our ethical and legal obligations, as recommended by the American Medical Association's Code of Ethics, I have informed the patient of my clinical impression; the nature and purpose of the treatment or procedure; the risks, benefits, and possible complications of the intervention; the alternatives, including doing nothing; the risk(s) and benefit(s) of the alternative treatment(s) or procedure(s); and the risk(s) and benefit(s) of doing nothing. The patient was provided information about the general risks and possible complications associated with the procedure. These may include, but are not limited to: failure to achieve desired goals, infection, bleeding, organ or nerve damage, allergic reactions, paralysis, and death. In addition, the patient was informed of those risks and complications associated to Spine-related procedures, such as failure to decrease pain; infection (i.e.: Meningitis, epidural or intraspinal abscess); bleeding (i.e.: epidural hematoma, subarachnoid hemorrhage, or any other type of intraspinal or peri-dural bleeding); organ or nerve damage (i.e.: Any type of peripheral nerve, nerve root, or spinal cord injury) with subsequent damage to sensory, motor, and/or autonomic systems, resulting in permanent pain, numbness, and/or weakness of one or several areas of the body; allergic reactions; (i.e.: anaphylactic reaction); and/or death. Furthermore, the patient was informed of those risks and complications associated with the medications. These include, but are not limited to: allergic reactions (i.e.: anaphylactic or anaphylactoid  reaction(s)); adrenal axis suppression; blood sugar elevation that in diabetics may result in ketoacidosis  or comma; water retention that in patients with history of congestive heart failure may result in shortness of breath, pulmonary edema, and decompensation with resultant heart failure; weight gain; swelling or edema; medication-induced neural toxicity; particulate matter embolism and blood vessel occlusion with resultant organ, and/or nervous system infarction; and/or aseptic necrosis of one or more joints. Finally, the patient was informed that Medicine is not an exact science; therefore, there is also the possibility of unforeseen or unpredictable risks and/or possible complications that may result in a catastrophic outcome. The patient indicated having understood very clearly. We have given the patient no guarantees and we have made no promises. Enough time was given to the patient to ask questions, all of which were answered to the patient's satisfaction. Cheyenne Walker has indicated that she wanted to continue with the procedure. Attestation: I, the ordering provider, attest that I have discussed with the patient the benefits, risks, side-effects, alternatives, likelihood of achieving goals, and potential problems during recovery for the procedure that I have provided informed consent. Date  Time: 08/31/2018  9:07 AM  Pre-Procedure Preparation:  Monitoring: As per clinic protocol. Respiration, ETCO2, SpO2, BP, heart rate and rhythm monitor placed and checked for adequate function Safety Precautions: Patient was assessed for positional comfort and pressure points before starting the procedure. Time-out: I initiated and conducted the "Time-out" before starting the procedure, as per protocol. The patient was asked to participate by confirming the accuracy of the "Time Out" information. Verification of the correct person, site, and procedure were performed and confirmed by me, the nursing staff, and the  patient. "Time-out" conducted as per Joint Commission's Universal Protocol (UP.01.01.01). Time: 0949  Description of Procedure:          Position: Prone with head of the table was raised to facilitate breathing. Target Area: The interlaminar space, initially targeting the lower laminar border of the superior vertebral body. Approach: Paramedial approach. Area Prepped: Entire Posterior Lumbar Region Prepping solution: ChloraPrep (2% chlorhexidine gluconate and 70% isopropyl alcohol) Safety Precautions: Aspiration looking for blood return was conducted prior to all injections. At no point did we inject any substances, as a needle was being advanced. No attempts were made at seeking any paresthesias. Safe injection practices and needle disposal techniques used. Medications properly checked for expiration dates. SDV (single dose vial) medications used. Description of the Procedure: Protocol guidelines were followed. The procedure needle was introduced through the skin, ipsilateral to the reported pain, and advanced to the target area. Bone was contacted and the needle walked caudad, until the lamina was cleared. The epidural space was identified using "loss-of-resistance technique" with 2-3 ml of PF-NaCl (0.9% NSS), in a 5cc LOR glass syringe. Vitals:   08/31/18 0929 08/31/18 0949 08/31/18 0954  BP: 139/80 135/82 135/79  Pulse: 63    Resp: 16 14 12   Temp: 98.5 F (36.9 C)    TempSrc: Oral    SpO2: 97% 98% 96%  Weight: 229 lb (103.9 kg)    Height: 5\' 4"  (1.626 m)      Start Time: 0949 hrs. End Time: 0957 hrs. Materials:  Needle(s) Type: Epidural needle Gauge: 17G Length: 3.5-in Medication(s): Please see orders for medications and dosing details. 9 cc solution containing 6 cc of preservative-free saline, 2 cc of 0.2% ropivacaine, 1 cc of Decadron 10 mg/cc  *at the L5-S1 space, upon advancing into the IL space using lateral fluoroscopy, did obtain spinal fluid (although we were far from  dura), withdrew needle 1-2 cm with cessation of  fluid, injected contrast appropriate spread.  Discussed signs and symptoms as well as treatment for dural puncture headache. Imaging Guidance (Spinal):          Type of Imaging Technique: Fluoroscopy Guidance (Spinal) Indication(s): Assistance in needle guidance and placement for procedures requiring needle placement in or near specific anatomical locations not easily accessible without such assistance. Exposure Time: Please see nurses notes. Contrast: Before injecting any contrast, we confirmed that the patient did not have an allergy to iodine, shellfish, or radiological contrast. Once satisfactory needle placement was completed at the desired level, radiological contrast was injected. Contrast injected under live fluoroscopy. No contrast complications. See chart for type and volume of contrast used. Fluoroscopic Guidance: I was personally present during the use of fluoroscopy. "Tunnel Vision Technique" used to obtain the best possible view of the target area. Parallax error corrected before commencing the procedure. "Direction-depth-direction" technique used to introduce the needle under continuous pulsed fluoroscopy. Once target was reached, antero-posterior, oblique, and lateral fluoroscopic projection used confirm needle placement in all planes. Images permanently stored in EMR. Interpretation: I personally interpreted the imaging intraoperatively. Adequate needle placement confirmed in multiple planes. Appropriate spread of contrast into desired area was observed. No evidence of afferent or efferent intravascular uptake. No intrathecal or subarachnoid spread observed. Permanent images saved into the patient's record.  Antibiotic Prophylaxis:   Anti-infectives (From admission, onward)   None     Indication(s): None identified  Post-operative Assessment:  Post-procedure Vital Signs:  Pulse/HCG Rate: 63(!) 48 Temp: 98.5 F (36.9 C) Resp:  12 BP: 135/79 SpO2: 96 %  EBL: None  Complications: No immediate post-treatment complications observed by team, or reported by patient.  Note: The patient tolerated the entire procedure well. A repeat set of vitals were taken after the procedure and the patient was kept under observation following institutional policy, for this type of procedure. Post-procedural neurological assessment was performed, showing return to baseline, prior to discharge. The patient was provided with post-procedure discharge instructions, including a section on how to identify potential problems. Should any problems arise concerning this procedure, the patient was given instructions to immediately contact us, at any time, without hesitation. In any case, we plan to contact the patient by telephone for a follow-up status report regarding this interventional procedure.  Comments:  No additional relevant information. 5 out of 5 strength bilateral lower extremity: Plantar flexion, dorsiflexion, knee flexion, knee extension.  Plan of Care  Possible dural puncture as we did have spinal fluid emerging from Tuohy at L5-S1 when advancing in the lateral- discussed with patient.  Patient stayed in recovery room for 15 to 20 minutes upright.  No endorsement of headache, nausea, photophobia.  She also walked up and down the hall did not report any headaches at that time.  Encouraged the patient to drink plenty of fluids, take caffeine if she has a headache.  We will follow-up with her tomorrow.  Patient provided with our clinic number if she does have positional headache or any other questions or concerns..  Imaging Orders     DG C-Arm 1-60 Min-No Report Procedure Orders    No procedure(s) ordered today    Medications ordered for procedure: Meds ordered this encounter  Medications  . iopamidol (ISOVUE-M) 41 % intrathecal injection 10 mL  . ropivacaine (PF) 2 mg/mL (0.2%) (NAROPIN) injection 2 mL  . sodium chloride flush (NS)  0.9 % injection 2 mL  . lidocaine (XYLOCAINE) 2 % (with pres) injection 200 mg  . dexamethasone (  DECADRON) injection 10 mg   Medications administered: We administered iopamidol, ropivacaine (PF) 2 mg/mL (0.2%), sodium chloride flush, lidocaine, and dexamethasone.  See the medical record for exact dosing, route, and time of administration.  New Prescriptions   No medications on file   Disposition: Discharge home  Discharge Date & Time: 08/31/2018; 1016 hrs.   Physician-requested Follow-up: Return in about 6 weeks (around 10/12/2018) for Post Procedure Evaluation.  Future Appointments  Date Time Provider Ray  10/08/2018 10:15 AM Gillis Santa, MD [facility] None   Primary Care Physician: Steele Sizer, MD Location: [facility] Note by: Gillis Santa, MD Date: 08/31/2018; Time: 1:23 PM  Disclaimer:  Medicine is not an exact science. The only guarantee in medicine is that nothing is guaranteed. It is important to note that the decision to proceed with this intervention was based on the information collected from the patient. The Data and conclusions were drawn from the patient's questionnaire, the interview, and the physical examination. Because the information was provided in large part by the patient, it cannot be guaranteed that it has not been purposely or unconsciously manipulated. Every effort has been made to obtain as much relevant data as possible for this evaluation. It is important to note that the conclusions that lead to this procedure are derived in large part from the available data. Always take into account that the treatment will also be dependent on availability of resources and existing treatment guidelines, considered by other Pain Management Practitioners as being common knowledge and practice, at the time of the intervention. For Medico-Legal purposes, it is also important to point out that variation in procedural techniques and  pharmacological choices are the acceptable norm. The indications, contraindications, technique, and results of the above procedure should only be interpreted and judged by a Board-Certified Interventional Pain Specialist with extensive familiarity and expertise in the same exact procedure and technique.

## 2018-08-31 NOTE — Patient Instructions (Signed)

## 2018-09-01 ENCOUNTER — Telehealth: Payer: Self-pay

## 2018-09-01 NOTE — Telephone Encounter (Signed)
Post procedure phone call.  Left message.  

## 2018-10-08 ENCOUNTER — Other Ambulatory Visit: Payer: Self-pay

## 2018-10-08 ENCOUNTER — Encounter: Payer: Self-pay | Admitting: Student in an Organized Health Care Education/Training Program

## 2018-10-08 ENCOUNTER — Ambulatory Visit
Payer: 59 | Attending: Student in an Organized Health Care Education/Training Program | Admitting: Student in an Organized Health Care Education/Training Program

## 2018-10-08 VITALS — BP 129/93 | HR 68 | Temp 98.1°F | Resp 16 | Ht 63.0 in | Wt 220.0 lb

## 2018-10-08 DIAGNOSIS — M51379 Other intervertebral disc degeneration, lumbosacral region without mention of lumbar back pain or lower extremity pain: Secondary | ICD-10-CM

## 2018-10-08 DIAGNOSIS — M5137 Other intervertebral disc degeneration, lumbosacral region: Secondary | ICD-10-CM | POA: Diagnosis present

## 2018-10-08 DIAGNOSIS — M47816 Spondylosis without myelopathy or radiculopathy, lumbar region: Secondary | ICD-10-CM

## 2018-10-08 DIAGNOSIS — M5416 Radiculopathy, lumbar region: Secondary | ICD-10-CM

## 2018-10-08 DIAGNOSIS — G894 Chronic pain syndrome: Secondary | ICD-10-CM | POA: Diagnosis not present

## 2018-10-08 MED ORDER — DICLOFENAC SODIUM 75 MG PO TBEC: 75.0000 mg | DELAYED_RELEASE_TABLET | Freq: Two times a day (BID) | ORAL | 0 refills | Status: AC

## 2018-10-08 NOTE — Progress Notes (Signed)
Safety precautions to be maintained throughout the outpatient stay will include: orient to surroundings, keep bed in low position, maintain call bell within reach at all times, provide assistance with transfer out of bed and ambulation.  

## 2018-10-08 NOTE — Progress Notes (Signed)
Patient's Name: Cheyenne Walker  MRN: [id number]  Referring Provider: Steele Sizer, MD  DOB: 13-Oct-1970  PCP: Steele Sizer, MD  DOS: 10/08/2018  Note by: Gillis Santa, MD  Service setting: Ambulatory outpatient  Specialty: Interventional Pain Management  Location: [facility] (AMB) Pain Management Facility    Patient type: Established   Primary Reason(s) for Visit: Encounter for post-procedure evaluation of chronic illness with mild to moderate exacerbation CC: Back Pain (lower)  HPI  Cheyenne Walker is a 48 y.o. year old, female patient, who comes today for a post-procedure evaluation. She has Anxiety; Benign essential HTN; Allergic rhinitis; Gastro-esophageal reflux disease without esophagitis; Major depressive episode; Morbid obesity with BMI of 40.0-44.9, adult ([place]); Dysmetabolic syndrome; Numerous moles; Plantar fasciitis; PTSD (post-traumatic stress disorder); Vitamin D deficiency; Umbilical hernia without obstruction and without gangrene; DDD (degenerative disc disease), lumbosacral; Lumbar radiculopathy (L5); Morbid obesity (HCC); and Chronic pain syndrome on their problem list. Her primarily concern today is the Back Pain (lower)  Pain Assessment: Location: Lower Back Radiating: denies Onset: More than a month ago Duration: Chronic pain Quality: (shocking pain at times) Severity: 3 /10 (subjective, self-reported pain score)  Note: Reported level is compatible with observation.                         When using our objective Pain Scale, levels between 6 and 10/10 are said to belong in an emergency room, as it progressively worsens from a 6/10, described as severely limiting, requiring emergency care not usually available at an outpatient pain management facility. At a 6/10 level, communication becomes difficult and requires great effort. Assistance to reach the emergency department may be required. Facial flushing and profuse sweating along with potentially dangerous increases in heart rate and  blood pressure will be evident. Effect on ADL: denies Timing: Intermittent Modifying factors: procedure BP: (!) 129/93  HR: 68  Cheyenne Walker comes in today for post-procedure evaluation.  Further details on both, my assessment(s), as well as the proposed treatment plan, please see below.  Patient follows up status post lumbar epidural steroid injection.  Patient states that the lumbar epidural injection was helpful for her radiating pain down both of her legs.  She does not experience that as often anymore.  Patient does have intermittent axial low back pain worse with certain positions.  This is largely musculoskeletal pain from her body habitus and morbid obesity.  Patient is in the process of losing weight and states that she has dropped approximately 25 pounds in the last year.  She is encouraged to continue with weight loss.  This will also help her lumbosacral pain.  Post-Procedure Assessment  08/31/2018 Procedure: L5-S1 ESI #2 Pre-procedure pain score:  3/10 Post-procedure pain score: 3/10         Influential Factors: BMI: 38.97 kg/m Intra-procedural challenges: None observed.         Assessment challenges: None detected.              Reported side-effects: None.        Post-procedural adverse reactions or complications: None reported         Sedation: Please see nurses note. When no sedatives are used, the analgesic levels obtained are directly associated to the effectiveness of the local anesthetics. However, when sedation is provided, the level of analgesia obtained during the initial 1 hour following the intervention, is believed to be the result of a combination of factors. These factors may include, but are not  limited to: 1. The effectiveness of the local anesthetics used. 2. The effects of the analgesic(s) and/or anxiolytic(s) used. 3. The degree of discomfort experienced by the patient at the time of the procedure. 4. The patients ability and reliability in recalling and  recording the events. 5. The presence and influence of possible secondary gains and/or psychosocial factors. Reported result: Relief experienced during the 1st hour after the procedure: 100 % (Ultra-Short Term Relief)            Interpretative annotation: Clinically appropriate result. Analgesia during this period is likely to be Local Anesthetic and/or IV Sedative (Analgesic/Anxiolytic) related.          Effects of local anesthetic: The analgesic effects attained during this period are directly associated to the localized infiltration of local anesthetics and therefore cary significant diagnostic value as to the etiological location, or anatomical origin, of the pain. Expected duration of relief is directly dependent on the pharmacodynamics of the local anesthetic used. Long-acting (4-6 hours) anesthetics used.  Reported result: Relief during the next 4 to 6 hour after the procedure: 100 % (Short-Term Relief)            Interpretative annotation: Clinically appropriate result. Analgesia during this period is likely to be Local Anesthetic-related.          Long-term benefit: Defined as the period of time past the expected duration of local anesthetics (1 hour for short-acting and 4-6 hours for long-acting). With the possible exception of prolonged sympathetic blockade from the local anesthetics, benefits during this period are typically attributed to, or associated with, other factors such as analgesic sensory neuropraxia, antiinflammatory effects, or beneficial biochemical changes provided by agents other than the local anesthetics.  Reported result: Extended relief following procedure: 100 %(X 2 weeks; since then, occasionally and without notice, pt states she randomly feels shocking sensations in lower back ; other than than pain relief continues.) (Long-Term Relief)            Interpretative annotation: Clinically possible results. Good relief. No permanent benefit expected. Inflammation plays a part  in the etiology to the pain.          Current benefits: Defined as reported results that persistent at this point in time.   Analgesia: 50-75 %            Function: Somewhat improved ROM: Somewhat improved Interpretative annotation: Recurrence of symptoms. No permanent benefit expected. Effective diagnostic intervention.          Interpretation: Results would suggest a successful diagnostic intervention.                  Plan:  Please see "Plan of Care" for details.                Laboratory Chemistry  Inflammation Markers (CRP: Acute Phase) (ESR: Chronic Phase) No results found for: CRP, ESRSEDRATE, LATICACIDVEN                       Rheumatology Markers No results found for: RF, ANA, LABURIC, URICUR, LYMEIGGIGMAB, LYMEABIGMQN, HLAB27                      Renal Function Markers Lab Results  Component Value Date   BUN 20 10/31/2017   CREATININE 0.84 46/28/6381   BCR NOT APPLICABLE 77/07/6578   GFRAA >60 10/15/2016   GFRNONAA >60 10/15/2016  Hepatic Function Markers Lab Results  Component Value Date   AST 22 10/31/2017   ALT 25 10/31/2017   ALBUMIN 3.8 10/28/2014   ALKPHOS 62 10/28/2014                        Electrolytes Lab Results  Component Value Date   NA 140 10/31/2017   K 3.7 10/31/2017   CL 104 10/31/2017   CALCIUM 9.6 10/31/2017                        Neuropathy Markers Lab Results  Component Value Date   VITAMINB12 418 10/31/2017   HGBA1C 5.0 10/31/2017   HIV NONREACTIVE 01/06/2017                        CNS Tests No results found for: COLORCSF, APPEARCSF, RBCCOUNTCSF, WBCCSF, POLYSCSF, LYMPHSCSF, EOSCSF, PROTEINCSF, GLUCCSF, JCVIRUS, CSFOLI, IGGCSF                      Bone Pathology Markers Lab Results  Component Value Date   VD25OH 20 (L) 10/31/2017                         Coagulation Parameters Lab Results  Component Value Date   PLT 380 10/31/2017                        Cardiovascular Markers Lab Results    Component Value Date   TROPONINI <0.03 10/15/2016   HGB 13.7 10/31/2017   HCT 41.1 10/31/2017                         CA Markers No results found for: CEA, CA125, LABCA2                      Note: Lab results reviewed.  Recent Diagnostic Imaging Results  DG C-Arm 1-60 Min-No Report Fluoroscopy was utilized by the requesting physician.  No radiographic  interpretation.   Complexity Note: Imaging results reviewed. Results shared with Ms. Dever, using Layman's terms.                         Meds   Current Outpatient Medications:  .  fluticasone (FLONASE) 50 MCG/ACT nasal spray, SPRAY 2 SPRAYS (100 MCG) IN EACH NOSTRIL BY INTRANASAL ROUTE ONCE DAILY, Disp: , Rfl: 4 .  gabapentin (NEURONTIN) 300 MG capsule, Take 1 capsule (300 mg total) by mouth at bedtime., Disp: 30 capsule, Rfl: 11 .  Ibuprofen 200 MG CAPS, Take 600 mg by mouth every morning., Disp: , Rfl:  .  tiZANidine (ZANAFLEX) 4 MG tablet, Take 1-2 tablets (4-8 mg total) by mouth at bedtime., Disp: 60 tablet, Rfl: 11 .  triamterene-hydrochlorothiazide (MAXZIDE-25) 37.5-25 MG tablet, Take 1 tablet by mouth daily., Disp: 90 tablet, Rfl: 1 .  diclofenac (VOLTAREN) 75 MG EC tablet, Take 1 tablet (75 mg total) by mouth 2 (two) times daily for 14 days., Disp: 28 tablet, Rfl: 0  ROS  Constitutional: Denies any fever or chills Gastrointestinal: No reported hemesis, hematochezia, vomiting, or acute GI distress Musculoskeletal: Denies any acute onset joint swelling, redness, loss of ROM, or weakness Neurological: No reported episodes of acute onset apraxia, aphasia, dysarthria, agnosia, amnesia, paralysis, loss of coordination, or loss of consciousness  Allergies  Ms. Steinhilber is allergic to ace inhibitors.  PFSH  Drug: Ms. Bramlett  reports no history of drug use. Alcohol:  reports no history of alcohol use. Tobacco:  reports that she has never smoked. She has never used smokeless tobacco. Medical:  has a past medical history of  Hypertension. Surgical: Ms. Cardamone  has a past surgical history that includes Hernia repair (10/2014) and Cholecystectomy (2006). Family: family history includes Hypertension in her brother, mother, and sister.  Constitutional Exam  General appearance: Well nourished, well developed, and well hydrated. In no apparent acute distress Vitals:   10/08/18 1042  BP: (!) 129/93  Pulse: 68  Resp: 16  Temp: 98.1 F (36.7 C)  TempSrc: Oral  SpO2: 97%  Weight: 220 lb (99.8 kg)  Height: _0  (1.6 m)   BMI Assessment: Estimated body mass index is 38.97 kg/m as calculated from the following:   Height as of this encounter: _1  (1.6 m).   Weight as of this encounter: 220 lb (99.8 kg).  BMI interpretation table: BMI level Category Range association with higher incidence of chronic pain  <18 kg/m2 Underweight   18.5-24.9 kg/m2 Ideal body weight   25-29.9 kg/m2 Overweight Increased incidence by 20%  30-34.9 kg/m2 Obese (Class I) Increased incidence by 68%  35-39.9 kg/m2 Severe obesity (Class II) Increased incidence by 136%  >40 kg/m2 Extreme obesity (Class III) Increased incidence by 254%   Patient's current BMI Ideal Body weight  Body mass index is 38.97 kg/m. Ideal body weight: 52.4 kg (115 lb 8.3 oz) Adjusted ideal body weight: 71.4 kg (157 lb 5 oz)   BMI Readings from Last 4 Encounters:  10/08/18 38.97 kg/m  08/31/18 39.31 kg/m  08/13/18 38.97 kg/m  05/07/18 44.63 kg/m   Wt Readings from Last 4 Encounters:  10/08/18 220 lb (99.8 kg)  08/31/18 229 lb (103.9 kg)  08/13/18 220 lb (99.8 kg)  05/07/18 260 lb (117.9 kg)  Psych/Mental status: Alert, oriented x 3 (person, place, & time)       Eyes: PERLA Respiratory: No evidence of acute respiratory distress  Cervical Spine Area Exam  Skin & Axial Inspection: No masses, redness, edema, swelling, or associated skin lesions Alignment: Symmetrical Functional ROM: Unrestricted ROM      Stability: No instability detected Muscle  Tone/Strength: Functionally intact. No obvious neuro-muscular anomalies detected. Sensory (Neurological): Unimpaired Palpation: No palpable anomalies              Upper Extremity (UE) Exam    Side: Right upper extremity  Side: Left upper extremity  Skin & Extremity Inspection: Skin color, temperature, and hair growth are WNL. No peripheral edema or cyanosis. No masses, redness, swelling, asymmetry, or associated skin lesions. No contractures.  Skin & Extremity Inspection: Skin color, temperature, and hair growth are WNL. No peripheral edema or cyanosis. No masses, redness, swelling, asymmetry, or associated skin lesions. No contractures.  Functional ROM: Unrestricted ROM          Functional ROM: Unrestricted ROM          Muscle Tone/Strength: Functionally intact. No obvious neuro-muscular anomalies detected.  Muscle Tone/Strength: Functionally intact. No obvious neuro-muscular anomalies detected.  Sensory (Neurological): Unimpaired          Sensory (Neurological): Unimpaired          Palpation: No palpable anomalies              Palpation: No palpable anomalies  Provocative Test(s):  Phalen's test: deferred Tinel's test: deferred Apley's scratch test (touch opposite shoulder):  Action 1 (Across chest): deferred Action 2 (Overhead): deferred Action 3 (LB reach): deferred   Provocative Test(s):  Phalen's test: deferred Tinel's test: deferred Apley's scratch test (touch opposite shoulder):  Action 1 (Across chest): deferred Action 2 (Overhead): deferred Action 3 (LB reach): deferred    Thoracic Spine Area Exam  Skin & Axial Inspection: No masses, redness, or swelling Alignment: Symmetrical Functional ROM: Unrestricted ROM Stability: No instability detected Muscle Tone/Strength: Functionally intact. No obvious neuro-muscular anomalies detected. Sensory (Neurological): Unimpaired Muscle strength & Tone: No palpable anomalies  Lumbar Spine Area Exam  Skin & Axial  Inspection: No masses, redness, or swelling Alignment: Symmetrical Functional ROM: Improved after treatment       Stability: No instability detected Muscle Tone/Strength: Functionally intact. No obvious neuro-muscular anomalies detected. Sensory (Neurological): Improved Palpation: No palpable anomalies       Provocative Tests: Hyperextension/rotation test: Improved after treatment       Lumbar quadrant test (Kemp's test): deferred today       Lateral bending test: deferred today       Patrick's Maneuver: deferred today                   FABER* test: deferred today                   S-I anterior distraction/compression test: deferred today         S-I lateral compression test: deferred today         S-I Thigh-thrust test: deferred today         S-I Gaenslen's test: deferred today         *(Flexion, ABduction and External Rotation)  Gait & Posture Assessment  Ambulation: Unassisted Gait: Relatively normal for age and body habitus Posture: WNL   Lower Extremity Exam    Side: Right lower extremity  Side: Left lower extremity  Stability: No instability observed          Stability: No instability observed          Skin & Extremity Inspection: Skin color, temperature, and hair growth are WNL. No peripheral edema or cyanosis. No masses, redness, swelling, asymmetry, or associated skin lesions. No contractures.  Skin & Extremity Inspection: Skin color, temperature, and hair growth are WNL. No peripheral edema or cyanosis. No masses, redness, swelling, asymmetry, or associated skin lesions. No contractures.  Functional ROM: Unrestricted ROM                  Functional ROM: Unrestricted ROM                  Muscle Tone/Strength: Functionally intact. No obvious neuro-muscular anomalies detected.  Muscle Tone/Strength: Functionally intact. No obvious neuro-muscular anomalies detected.  Sensory (Neurological): Unimpaired        Sensory (Neurological): Unimpaired        DTR: Patellar: deferred  today Achilles: deferred today Plantar: deferred today  DTR: Patellar: deferred today Achilles: deferred today Plantar: deferred today  Palpation: No palpable anomalies  Palpation: No palpable anomalies   Assessment  Primary Diagnosis & Pertinent Problem List: The primary encounter diagnosis was Lumbar radiculopathy (L5). Diagnoses of Morbid obesity ([place]), Chronic pain syndrome, Degeneration of lumbar or lumbosacral intervertebral disc, and Lumbar spondylosis were also pertinent to this visit.  Status Diagnosis  Controlled Controlled Controlled 1. Lumbar radiculopathy (L5)   2. Morbid obesity ([place])   3. Chronic  pain syndrome   4. Degeneration of lumbar or lumbosacral intervertebral disc   5. Lumbar spondylosis     Problems updated and reviewed during this visit: Problem  Lumbar radiculopathy (L5)  Morbid Obesity (Hcc)  Chronic Pain Syndrome   Plan of Care  Pharmacotherapy (Medications Ordered): Meds ordered this encounter  Medications  . diclofenac (VOLTAREN) 75 MG EC tablet    Sig: Take 1 tablet (75 mg total) by mouth 2 (two) times daily for 14 days.    Dispense:  28 tablet    Refill:  0   Lab-work, procedure(s), and/or referral(s): Orders Placed This Encounter  Procedures  . Lumbar Epidural Injection   Time Note: Greater than 50% of the 25 minute(s) of face-to-face time spent with Ms. Fantroy, was spent in counseling/coordination of care regarding: Ms. Pyka primary cause of pain, the results of her recent test(s), the treatment plan, treatment alternatives, medication side effects, the results, interpretation and significance of  her recent diagnostic interventional treatment(s), realistic expectations and the need to bring and keep the BMI below 30.  Provider-requested follow-up: Return if symptoms worsen or fail to improve.  No future appointments.  Primary Care Physician: Steele Sizer, MD Location: [facility] Out[facility] Note by: Gillis Santa, M.D Date: 10/08/2018; Time: 12:17 PM  Patient Instructions   Stop ibuprofen while on diclofenac  Moderate Conscious Sedation, Adult Sedation is the use of medicines to promote relaxation and relieve discomfort and anxiety. Moderate conscious sedation is a type of sedation. Under moderate conscious sedation, you are less alert than normal, but you are still able to respond to instructions, touch, or both. Moderate conscious sedation is used during short medical and dental procedures. It is milder than deep sedation, which is a type of sedation under which you cannot be easily woken up. It is also milder than general anesthesia, which is the use of medicines to make you unconscious. Moderate conscious sedation allows you to return to your regular activities sooner. Tell a health care provider about:  Any allergies you have.  All medicines you are taking, including vitamins, herbs, eye drops, creams, and over-the-counter medicines.  Use of steroids (by mouth or creams).  Any problems you or family members have had with sedatives and anesthetic medicines.  Any blood disorders you have.  Any surgeries you have had.  Any medical conditions you have, such as sleep apnea.  Whether you are pregnant or may be pregnant.  Any use of cigarettes, alcohol, marijuana, or street drugs. What are the risks? Generally, this is a safe procedure. However, problems may occur, including:  Getting too much medicine (oversedation).  Nausea.  Allergic reaction to medicines.  Trouble breathing. If this happens, a breathing tube may be used to help with breathing. It will be removed when you are awake and breathing on your own.  Heart trouble.  Lung trouble. What happens before the procedure? Staying hydrated Follow instructions from your health care provider about hydration, which may include:  Up to 2 hours before the procedure - you may continue to drink clear liquids, such as water,  clear fruit juice, black coffee, and plain tea. Eating and drinking restrictions Follow instructions from your health care provider about eating and drinking, which may include:  8 hours before the procedure - stop eating heavy meals or foods such as meat, fried foods, or fatty foods.  6 hours before the procedure - stop eating light meals or foods, such as toast or cereal.  6 hours  before the procedure - stop drinking milk or drinks that contain milk.  2 hours before the procedure - stop drinking clear liquids. Medicine Ask your health care provider about:  Changing or stopping your regular medicines. This is especially important if you are taking diabetes medicines or blood thinners.  Taking medicines such as aspirin and ibuprofen. These medicines can thin your blood. Do not take these medicines before your procedure if your health care provider instructs you not to.  Tests and exams  You will have a physical exam.  You may have blood tests done to show: ? How well your kidneys and liver are working. ? How well your blood can clot. General instructions  Plan to have someone take you home from the hospital or clinic.  If you will be going home right after the procedure, plan to have someone with you for 24 hours. What happens during the procedure?  An IV tube will be inserted into one of your veins.  Medicine to help you relax (sedative) will be given through the IV tube.  The medical or dental procedure will be performed. What happens after the procedure?  Your blood pressure, heart rate, breathing rate, and blood oxygen level will be monitored often until the medicines you were given have worn off.  Do not drive for 24 hours. This information is not intended to replace advice given to you by your health care provider. Make sure you discuss any questions you have with your health care provider. Document Released: 06/04/2001 Document Revised: 02/13/2016 Document Reviewed:  12/30/2015 Elsevier Interactive Patient Education  2019 [place]  What are the risk, side effects and possible complications? Generally speaking, most procedures are safe.  However, with any procedure there are risks, side effects, and the possibility of complications.  The risks and complications are dependent upon the sites that are lesioned, or the type of nerve block to be performed.  The closer the procedure is to the spine, the more serious the risks are.  Great care is taken when placing the radio frequency needles, block needles or lesioning probes, but sometimes complications can occur. 1. Infection: Any time there is an injection through the skin, there is a risk of infection.  This is why sterile conditions are used for these blocks.  There are four possible types of infection. 1. Localized skin infection. 2. Central Nervous System Infection-This can be in the form of Meningitis, which can be deadly. 3. Epidural Infections-This can be in the form of an epidural abscess, which can cause pressure inside of the spine, causing compression of the spinal cord with subsequent paralysis. This would require an emergency surgery to decompress, and there are no guarantees that the patient would recover from the paralysis. 4. Discitis-This is an infection of the intervertebral discs.  It occurs in about 1% of discography procedures.  It is difficult to treat and it may lead to surgery.        2. Pain: the needles have to go through skin and soft tissues, will cause soreness.       3. Damage to internal structures:  The nerves to be lesioned may be near blood vessels or    other nerves which can be potentially damaged.       4. Bleeding: Bleeding is more common if the patient is taking blood thinners such as  aspirin, Coumadin, Ticiid, Plavix, etc., or if he/she have some genetic predisposition  such as hemophilia. Bleeding  into the spinal canal can cause  compression of the spinal  cord with subsequent paralysis.  This would require an emergency surgery to  decompress and there are no guarantees that the patient would recover from the  paralysis.       5. Pneumothorax:  Puncturing of a lung is a possibility, every time a needle is introduced in  the area of the chest or upper back.  Pneumothorax refers to free air around the  collapsed lung(s), inside of the thoracic cavity (chest cavity).  Another two possible  complications related to a similar event would include: Hemothorax and Chylothorax.   These are variations of the Pneumothorax, where instead of air around the collapsed  lung(s), you may have blood or chyle, respectively.       6. Spinal headaches: They may occur with any procedures in the area of the spine.       7. Persistent CSF (Cerebro-Spinal Fluid) leakage: This is a rare problem, but may occur  with prolonged intrathecal or epidural catheters either due to the formation of a fistulous  track or a dural tear.       8. Nerve damage: By working so close to the spinal cord, there is always a possibility of  nerve damage, which could be as serious as a permanent spinal cord injury with  paralysis.       9. Death:  Although rare, severe deadly allergic reactions known as "Anaphylactic  reaction" can occur to any of the medications used.      10. Worsening of the symptoms:  We can always make thing worse.  What are the chances of something like this happening? Chances of any of this occuring are extremely low.  By statistics, you have more of a chance of getting killed in a motor vehicle accident: while driving to the hospital than any of the above occurring .  Nevertheless, you should be aware that they are possibilities.  In general, it is similar to taking a shower.  Everybody knows that you can slip, hit your head and get killed.  Does that mean that you should not shower again?  Nevertheless always keep in mind that statistics do not mean  anything if you happen to be on the wrong side of them.  Even if a procedure has a 1 (one) in a 1,000,000 (million) chance of going wrong, it you happen to be that one..Also, keep in mind that by statistics, you have more of a chance of having something go wrong when taking medications.  Who should not have this procedure? If you are on a blood thinning medication (e.g. Coumadin, Plavix, see list of "Blood Thinners"), or if you have an active infection going on, you should not have the procedure.  If you are taking any blood thinners, please inform your physician.  How should I prepare for this procedure?  Do not eat or drink anything at least six hours prior to the procedure.  Bring a driver with you .  It cannot be a taxi.  Come accompanied by an adult that can drive you back, and that is strong enough to help you if your legs get weak or numb from the local anesthetic.  Take all of your medicines the morning of the procedure with just enough water to swallow them.  If you have diabetes, make sure that you are scheduled to have your procedure done first thing in the morning, whenever possible.  If you have diabetes, take only  half of your insulin dose and notify our nurse that you have done so as soon as you arrive at the clinic.  If you are diabetic, but only take blood sugar pills (oral hypoglycemic), then do not take them on the morning of your procedure.  You may take them after you have had the procedure.  Do not take aspirin or any aspirin-containing medications, at least eleven (11) days prior to the procedure.  They may prolong bleeding.  Wear loose fitting clothing that may be easy to take off and that you would not mind if it got stained with Betadine or blood.  Do not wear any jewelry or perfume  Remove any nail coloring.  It will interfere with some of our monitoring equipment.  NOTE: Remember that this is not meant to be interpreted as a complete list of all possible  complications.  Unforeseen problems may occur.  BLOOD THINNERS The following drugs contain aspirin or other products, which can cause increased bleeding during surgery and should not be taken for 2 weeks prior to and 1 week after surgery.  If you should need take something for relief of minor pain, you may take acetaminophen which is found in Tylenol,m Datril, Anacin-3 and Panadol. It is not blood thinner. The products listed below are.  Do not take any of the products listed below in addition to any listed on your instruction sheet.  A.P.C or A.P.C with Codeine Codeine Phosphate Capsules #3 Ibuprofen Ridaura  ABC compound Congesprin Imuran rimadil  Advil Cope Indocin Robaxisal  Alka-Seltzer Effervescent Pain Reliever and Antacid Coricidin or Coricidin-D  Indomethacin Rufen  Alka-Seltzer plus Cold Medicine Cosprin Ketoprofen S-A-C Tablets  Anacin Analgesic Tablets or Capsules Coumadin Korlgesic Salflex  Anacin Extra Strength Analgesic tablets or capsules CP-2 Tablets Lanoril Salicylate  Anaprox Cuprimine Capsules Levenox Salocol  Anexsia-D Dalteparin Magan Salsalate  Anodynos Darvon compound Magnesium Salicylate Sine-off  Ansaid Dasin Capsules Magsal Sodium Salicylate  Anturane Depen Capsules Marnal Soma  APF Arthritis pain formula Dewitt's Pills Measurin Stanback  Argesic Dia-Gesic Meclofenamic Sulfinpyrazone  Arthritis Bayer Timed Release Aspirin Diclofenac Meclomen Sulindac  Arthritis pain formula Anacin Dicumarol Medipren Supac  Analgesic (Safety coated) Arthralgen Diffunasal Mefanamic Suprofen  Arthritis Strength Bufferin Dihydrocodeine Mepro Compound Suprol  Arthropan liquid Dopirydamole Methcarbomol with Aspirin Synalgos  ASA tablets/Enseals Disalcid Micrainin Tagament  Ascriptin Doan's Midol Talwin  Ascriptin A/D Dolene Mobidin Tanderil  Ascriptin Extra Strength Dolobid Moblgesic Ticlid  Ascriptin with Codeine Doloprin or Doloprin with Codeine Momentum Tolectin  Asperbuf Duoprin  Mono-gesic Trendar  Aspergum Duradyne Motrin or Motrin IB Triminicin  Aspirin plain, buffered or enteric coated Durasal Myochrisine Trigesic  Aspirin Suppositories Easprin Nalfon Trillsate  Aspirin with Codeine Ecotrin Regular or Extra Strength Naprosyn Uracel  Atromid-S Efficin Naproxen Ursinus  Auranofin Capsules Elmiron Neocylate Vanquish  Axotal Emagrin Norgesic Verin  Azathioprine Empirin or Empirin with Codeine Normiflo Vitamin E  Azolid Emprazil Nuprin Voltaren  Bayer Aspirin plain, buffered or children's or timed BC Tablets or powders Encaprin Orgaran Warfarin Sodium  Buff-a-Comp Enoxaparin Orudis Zorpin  Buff-a-Comp with Codeine Equegesic Os-Cal-Gesic   Buffaprin Excedrin plain, buffered or Extra Strength Oxalid   Bufferin Arthritis Strength Feldene Oxphenbutazone   Bufferin plain or Extra Strength Feldene Capsules Oxycodone with Aspirin   Bufferin with Codeine Fenoprofen Fenoprofen Pabalate or Pabalate-SF   Buffets II Flogesic Panagesic   Buffinol plain or Extra Strength Florinal or Florinal with Codeine Panwarfarin   Buf-Tabs Flurbiprofen Penicillamine   Butalbital Compound Four-way cold tablets Penicillin  Butazolidin Fragmin Pepto-Bismol   Carbenicillin Geminisyn Percodan   Carna Arthritis Reliever Geopen Persantine   Carprofen Gold's salt Persistin   Chloramphenicol Goody's Phenylbutazone   Chloromycetin Haltrain Piroxlcam   Clmetidine heparin Plaquenil   Cllnoril Hyco-pap Ponstel   Clofibrate Hydroxy chloroquine Propoxyphen         Before stopping any of these medications, be sure to consult the physician who ordered them.  Some, such as Coumadin (Warfarin) are ordered to prevent or treat serious conditions such as "deep thrombosis", "pumonary embolisms", and other heart problems.  The amount of time that you may need off of the medication may also vary with the medication and the reason for which you were taking it.  If you are taking any of these medications, please  make sure you notify your pain physician before you undergo any procedures.       Epidural Steroid Injection Patient Information  Description: The epidural space surrounds the nerves as they exit the spinal cord.  In some patients, the nerves can be compressed and inflamed by a bulging disc or a tight spinal canal (spinal stenosis).  By injecting steroids into the epidural space, we can bring irritated nerves into direct contact with a potentially helpful medication.  These steroids act directly on the irritated nerves and can reduce swelling and inflammation which often leads to decreased pain.  Epidural steroids may be injected anywhere along the spine and from the neck to the low back depending upon the location of your pain.   After numbing the skin with local anesthetic (like Novocaine), a small needle is passed into the epidural space slowly.  You may experience a sensation of pressure while this is being done.  The entire block usually last less than 10 minutes.  Conditions which may be treated by epidural steroids:   Low back and leg pain  Neck and arm pain  Spinal stenosis  Post-laminectomy syndrome  Herpes zoster (shingles) pain  Pain from compression fractures  Preparation for the injection:  1. Do not eat any solid food or dairy products within 8 hours of your appointment.  2. You may drink clear liquids up to 3 hours before appointment.  Clear liquids include water, black coffee, juice or soda.  No milk or cream please. 3. You may take your regular medication, including pain medications, with a sip of water before your appointment  Diabetics should hold regular insulin (if taken separately) and take 1/2 normal NPH dos the morning of the procedure.  Carry some sugar containing items with you to your appointment. 4. A driver must accompany you and be prepared to drive you home after your procedure.  5. Bring all your current medications with your. 6. An IV may be  inserted and sedation may be given at the discretion of the physician.   7. A blood pressure cuff, EKG and other monitors will often be applied during the procedure.  Some patients may need to have extra oxygen administered for a short period. 8. You will be asked to provide medical information, including your allergies, prior to the procedure.  We must know immediately if you are taking blood thinners (like Coumadin/Warfarin)  Or if you are allergic to IV iodine contrast (dye). We must know if you could possible be pregnant.  Possible side-effects:  Bleeding from needle site  Infection (rare, may require surgery)  Nerve injury (rare)  Numbness & tingling (temporary)  Difficulty urinating (rare, temporary)  Spinal headache ( a headache worse with  upright posture)  Light -headedness (temporary)  Pain at injection site (several days)  Decreased blood pressure (temporary)  Weakness in arm/leg (temporary)  Pressure sensation in back/neck (temporary)  Call if you experience:  Fever/chills associated with headache or increased back/neck pain.  Headache worsened by an upright position.  New onset weakness or numbness of an extremity below the injection site  Hives or difficulty breathing (go to the emergency room)  Inflammation or drainage at the infection site  Severe back/neck pain  Any new symptoms which are concerning to you  Please note:  Although the local anesthetic injected can often make your back or neck feel good for several hours after the injection, the pain will likely return.  It takes 3-7 days for steroids to work in the epidural space.  You may not notice any pain relief for at least that one week.  If effective, we will often do a series of three injections spaced 3-6 weeks apart to maximally decrease your pain.  After the initial series, we generally will wait several months before considering a repeat injection of the same type.  If you have any  questions, please call [phone] [facility]

## 2018-10-08 NOTE — Patient Instructions (Addendum)
Stop ibuprofen while on diclofenac  Moderate Conscious Sedation, Adult Sedation is the use of medicines to promote relaxation and relieve discomfort and anxiety. Moderate conscious sedation is a type of sedation. Under moderate conscious sedation, you are less alert than normal, but you are still able to respond to instructions, touch, or both. Moderate conscious sedation is used during short medical and dental procedures. It is milder than deep sedation, which is a type of sedation under which you cannot be easily woken up. It is also milder than general anesthesia, which is the use of medicines to make you unconscious. Moderate conscious sedation allows you to return to your regular activities sooner. Tell a health care provider about:  Any allergies you have.  All medicines you are taking, including vitamins, herbs, eye drops, creams, and over-the-counter medicines.  Use of steroids (by mouth or creams).  Any problems you or family members have had with sedatives and anesthetic medicines.  Any blood disorders you have.  Any surgeries you have had.  Any medical conditions you have, such as sleep apnea.  Whether you are pregnant or may be pregnant.  Any use of cigarettes, alcohol, marijuana, or street drugs. What are the risks? Generally, this is a safe procedure. However, problems may occur, including:  Getting too much medicine (oversedation).  Nausea.  Allergic reaction to medicines.  Trouble breathing. If this happens, a breathing tube may be used to help with breathing. It will be removed when you are awake and breathing on your own.  Heart trouble.  Lung trouble. What happens before the procedure? Staying hydrated Follow instructions from your health care provider about hydration, which may include:  Up to 2 hours before the procedure - you may continue to drink clear liquids, such as water, clear fruit juice, black coffee, and plain tea. Eating and drinking  restrictions Follow instructions from your health care provider about eating and drinking, which may include:  8 hours before the procedure - stop eating heavy meals or foods such as meat, fried foods, or fatty foods.  6 hours before the procedure - stop eating light meals or foods, such as toast or cereal.  6 hours before the procedure - stop drinking milk or drinks that contain milk.  2 hours before the procedure - stop drinking clear liquids. Medicine Ask your health care provider about:  Changing or stopping your regular medicines. This is especially important if you are taking diabetes medicines or blood thinners.  Taking medicines such as aspirin and ibuprofen. These medicines can thin your blood. Do not take these medicines before your procedure if your health care provider instructs you not to.  Tests and exams  You will have a physical exam.  You may have blood tests done to show: ? How well your kidneys and liver are working. ? How well your blood can clot. General instructions  Plan to have someone take you home from the hospital or clinic.  If you will be going home right after the procedure, plan to have someone with you for 24 hours. What happens during the procedure?  An IV tube will be inserted into one of your veins.  Medicine to help you relax (sedative) will be given through the IV tube.  The medical or dental procedure will be performed. What happens after the procedure?  Your blood pressure, heart rate, breathing rate, and blood oxygen level will be monitored often until the medicines you were given have worn off.  Do not drive for  24 hours. This information is not intended to replace advice given to you by your health care provider. Make sure you discuss any questions you have with your health care provider. Document Released: 06/04/2001 Document Revised: 02/13/2016 Document Reviewed: 12/30/2015 Elsevier Interactive Patient Education  2019 [employer]  What are the risk, side effects and possible complications? Generally speaking, most procedures are safe.  However, with any procedure there are risks, side effects, and the possibility of complications.  The risks and complications are dependent upon the sites that are lesioned, or the type of nerve block to be performed.  The closer the procedure is to the spine, the more serious the risks are.  Great care is taken when placing the radio frequency needles, block needles or lesioning probes, but sometimes complications can occur. 1. Infection: Any time there is an injection through the skin, there is a risk of infection.  This is why sterile conditions are used for these blocks.  There are four possible types of infection. 1. Localized skin infection. 2. Central Nervous System Infection-This can be in the form of Meningitis, which can be deadly. 3. Epidural Infections-This can be in the form of an epidural abscess, which can cause pressure inside of the spine, causing compression of the spinal cord with subsequent paralysis. This would require an emergency surgery to decompress, and there are no guarantees that the patient would recover from the paralysis. 4. Discitis-This is an infection of the intervertebral discs.  It occurs in about 1% of discography procedures.  It is difficult to treat and it may lead to surgery.        2. Pain: the needles have to go through skin and soft tissues, will cause soreness.       3. Damage to internal structures:  The nerves to be lesioned may be near blood vessels or    other nerves which can be potentially damaged.       4. Bleeding: Bleeding is more common if the patient is taking blood thinners such as  aspirin, Coumadin, Ticiid, Plavix, etc., or if he/she have some genetic predisposition  such as hemophilia. Bleeding into the spinal canal can cause compression of the spinal  cord with subsequent paralysis.  This would  require an emergency surgery to  decompress and there are no guarantees that the patient would recover from the  paralysis.       5. Pneumothorax:  Puncturing of a lung is a possibility, every time a needle is introduced in  the area of the chest or upper back.  Pneumothorax refers to free air around the  collapsed lung(s), inside of the thoracic cavity (chest cavity).  Another two possible  complications related to a similar event would include: Hemothorax and Chylothorax.   These are variations of the Pneumothorax, where instead of air around the collapsed  lung(s), you may have blood or chyle, respectively.       6. Spinal headaches: They may occur with any procedures in the area of the spine.       7. Persistent CSF (Cerebro-Spinal Fluid) leakage: This is a rare problem, but may occur  with prolonged intrathecal or epidural catheters either due to the formation of a fistulous  track or a dural tear.       8. Nerve damage: By working so close to the spinal cord, there is always a possibility of  nerve damage, which could be as serious as a permanent spinal  cord injury with  paralysis.       9. Death:  Although rare, severe deadly allergic reactions known as "Anaphylactic  reaction" can occur to any of the medications used.      10. Worsening of the symptoms:  We can always make thing worse.  What are the chances of something like this happening? Chances of any of this occuring are extremely low.  By statistics, you have more of a chance of getting killed in a motor vehicle accident: while driving to the hospital than any of the above occurring .  Nevertheless, you should be aware that they are possibilities.  In general, it is similar to taking a shower.  Everybody knows that you can slip, hit your head and get killed.  Does that mean that you should not shower again?  Nevertheless always keep in mind that statistics do not mean anything if you happen to be on the wrong side of them.  Even if a procedure  has a 1 (one) in a 1,000,000 (million) chance of going wrong, it you happen to be that one..Also, keep in mind that by statistics, you have more of a chance of having something go wrong when taking medications.  Who should not have this procedure? If you are on a blood thinning medication (e.g. Coumadin, Plavix, see list of "Blood Thinners"), or if you have an active infection going on, you should not have the procedure.  If you are taking any blood thinners, please inform your physician.  How should I prepare for this procedure?  Do not eat or drink anything at least six hours prior to the procedure.  Bring a driver with you .  It cannot be a taxi.  Come accompanied by an adult that can drive you back, and that is strong enough to help you if your legs get weak or numb from the local anesthetic.  Take all of your medicines the morning of the procedure with just enough water to swallow them.  If you have diabetes, make sure that you are scheduled to have your procedure done first thing in the morning, whenever possible.  If you have diabetes, take only half of your insulin dose and notify our nurse that you have done so as soon as you arrive at the clinic.  If you are diabetic, but only take blood sugar pills (oral hypoglycemic), then do not take them on the morning of your procedure.  You may take them after you have had the procedure.  Do not take aspirin or any aspirin-containing medications, at least eleven (11) days prior to the procedure.  They may prolong bleeding.  Wear loose fitting clothing that may be easy to take off and that you would not mind if it got stained with Betadine or blood.  Do not wear any jewelry or perfume  Remove any nail coloring.  It will interfere with some of our monitoring equipment.  NOTE: Remember that this is not meant to be interpreted as a complete list of all possible complications.  Unforeseen problems may occur.  BLOOD THINNERS The following  drugs contain aspirin or other products, which can cause increased bleeding during surgery and should not be taken for 2 weeks prior to and 1 week after surgery.  If you should need take something for relief of minor pain, you may take acetaminophen which is found in Tylenol,m Datril, Anacin-3 and Panadol. It is not blood thinner. The products listed below are.  Do not take any  of the products listed below in addition to any listed on your instruction sheet.  A.P.C or A.P.C with Codeine Codeine Phosphate Capsules #3 Ibuprofen Ridaura  ABC compound Congesprin Imuran rimadil  Advil Cope Indocin Robaxisal  Alka-Seltzer Effervescent Pain Reliever and Antacid Coricidin or Coricidin-D  Indomethacin Rufen  Alka-Seltzer plus Cold Medicine Cosprin Ketoprofen S-A-C Tablets  Anacin Analgesic Tablets or Capsules Coumadin Korlgesic Salflex  Anacin Extra Strength Analgesic tablets or capsules CP-2 Tablets Lanoril Salicylate  Anaprox Cuprimine Capsules Levenox Salocol  Anexsia-D Dalteparin Magan Salsalate  Anodynos Darvon compound Magnesium Salicylate Sine-off  Ansaid Dasin Capsules Magsal Sodium Salicylate  Anturane Depen Capsules Marnal Soma  APF Arthritis pain formula Dewitt's Pills Measurin Stanback  Argesic Dia-Gesic Meclofenamic Sulfinpyrazone  Arthritis Bayer Timed Release Aspirin Diclofenac Meclomen Sulindac  Arthritis pain formula Anacin Dicumarol Medipren Supac  Analgesic (Safety coated) Arthralgen Diffunasal Mefanamic Suprofen  Arthritis Strength Bufferin Dihydrocodeine Mepro Compound Suprol  Arthropan liquid Dopirydamole Methcarbomol with Aspirin Synalgos  ASA tablets/Enseals Disalcid Micrainin Tagament  Ascriptin Doan's Midol Talwin  Ascriptin A/D Dolene Mobidin Tanderil  Ascriptin Extra Strength Dolobid Moblgesic Ticlid  Ascriptin with Codeine Doloprin or Doloprin with Codeine Momentum Tolectin  Asperbuf Duoprin Mono-gesic Trendar  Aspergum Duradyne Motrin or Motrin IB Triminicin  Aspirin  plain, buffered or enteric coated Durasal Myochrisine Trigesic  Aspirin Suppositories Easprin Nalfon Trillsate  Aspirin with Codeine Ecotrin Regular or Extra Strength Naprosyn Uracel  Atromid-S Efficin Naproxen Ursinus  Auranofin Capsules Elmiron Neocylate Vanquish  Axotal Emagrin Norgesic Verin  Azathioprine Empirin or Empirin with Codeine Normiflo Vitamin E  Azolid Emprazil Nuprin Voltaren  Bayer Aspirin plain, buffered or children's or timed BC Tablets or powders Encaprin Orgaran Warfarin Sodium  Buff-a-Comp Enoxaparin Orudis Zorpin  Buff-a-Comp with Codeine Equegesic Os-Cal-Gesic   Buffaprin Excedrin plain, buffered or Extra Strength Oxalid   Bufferin Arthritis Strength Feldene Oxphenbutazone   Bufferin plain or Extra Strength Feldene Capsules Oxycodone with Aspirin   Bufferin with Codeine Fenoprofen Fenoprofen Pabalate or Pabalate-SF   Buffets II Flogesic Panagesic   Buffinol plain or Extra Strength Florinal or Florinal with Codeine Panwarfarin   Buf-Tabs Flurbiprofen Penicillamine   Butalbital Compound Four-way cold tablets Penicillin   Butazolidin Fragmin Pepto-Bismol   Carbenicillin Geminisyn Percodan   Carna Arthritis Reliever Geopen Persantine   Carprofen Gold's salt Persistin   Chloramphenicol Goody's Phenylbutazone   Chloromycetin Haltrain Piroxlcam   Clmetidine heparin Plaquenil   Cllnoril Hyco-pap Ponstel   Clofibrate Hydroxy chloroquine Propoxyphen         Before stopping any of these medications, be sure to consult the physician who ordered them.  Some, such as Coumadin (Warfarin) are ordered to prevent or treat serious conditions such as "deep thrombosis", "pumonary embolisms", and other heart problems.  The amount of time that you may need off of the medication may also vary with the medication and the reason for which you were taking it.  If you are taking any of these medications, please make sure you notify your pain physician before you undergo any  procedures.       Epidural Steroid Injection Patient Information  Description: The epidural space surrounds the nerves as they exit the spinal cord.  In some patients, the nerves can be compressed and inflamed by a bulging disc or a tight spinal canal (spinal stenosis).  By injecting steroids into the epidural space, we can bring irritated nerves into direct contact with a potentially helpful medication.  These steroids act directly on the irritated nerves and can reduce swelling  and inflammation which often leads to decreased pain.  Epidural steroids may be injected anywhere along the spine and from the neck to the low back depending upon the location of your pain.   After numbing the skin with local anesthetic (like Novocaine), a small needle is passed into the epidural space slowly.  You may experience a sensation of pressure while this is being done.  The entire block usually last less than 10 minutes.  Conditions which may be treated by epidural steroids:   Low back and leg pain  Neck and arm pain  Spinal stenosis  Post-laminectomy syndrome  Herpes zoster (shingles) pain  Pain from compression fractures  Preparation for the injection:  1. Do not eat any solid food or dairy products within 8 hours of your appointment.  2. You may drink clear liquids up to 3 hours before appointment.  Clear liquids include water, black coffee, juice or soda.  No milk or cream please. 3. You may take your regular medication, including pain medications, with a sip of water before your appointment  Diabetics should hold regular insulin (if taken separately) and take 1/2 normal NPH dos the morning of the procedure.  Carry some sugar containing items with you to your appointment. 4. A driver must accompany you and be prepared to drive you home after your procedure.  5. Bring all your current medications with your. 6. An IV may be inserted and sedation may be given at the discretion of the physician.    7. A blood pressure cuff, EKG and other monitors will often be applied during the procedure.  Some patients may need to have extra oxygen administered for a short period. 8. You will be asked to provide medical information, including your allergies, prior to the procedure.  We must know immediately if you are taking blood thinners (like Coumadin/Warfarin)  Or if you are allergic to IV iodine contrast (dye). We must know if you could possible be pregnant.  Possible side-effects:  Bleeding from needle site  Infection (rare, may require surgery)  Nerve injury (rare)  Numbness & tingling (temporary)  Difficulty urinating (rare, temporary)  Spinal headache ( a headache worse with upright posture)  Light -headedness (temporary)  Pain at injection site (several days)  Decreased blood pressure (temporary)  Weakness in arm/leg (temporary)  Pressure sensation in back/neck (temporary)  Call if you experience:  Fever/chills associated with headache or increased back/neck pain.  Headache worsened by an upright position.  New onset weakness or numbness of an extremity below the injection site  Hives or difficulty breathing (go to the emergency room)  Inflammation or drainage at the infection site  Severe back/neck pain  Any new symptoms which are concerning to you  Please note:  Although the local anesthetic injected can often make your back or neck feel good for several hours after the injection, the pain will likely return.  It takes 3-7 days for steroids to work in the epidural space.  You may not notice any pain relief for at least that one week.  If effective, we will often do a series of three injections spaced 3-6 weeks apart to maximally decrease your pain.  After the initial series, we generally will wait several months before considering a repeat injection of the same type.  If you have any questions, please call [phone] [facility]

## 2018-10-21 ENCOUNTER — Other Ambulatory Visit: Payer: Self-pay | Admitting: Family Medicine

## 2018-10-21 DIAGNOSIS — I10 Essential (primary) hypertension: Secondary | ICD-10-CM

## 2018-10-28 ENCOUNTER — Ambulatory Visit
Payer: 59 | Attending: Student in an Organized Health Care Education/Training Program | Admitting: Student in an Organized Health Care Education/Training Program

## 2018-10-28 ENCOUNTER — Encounter: Payer: Self-pay | Admitting: Student in an Organized Health Care Education/Training Program

## 2018-10-28 ENCOUNTER — Other Ambulatory Visit: Payer: Self-pay

## 2018-10-28 VITALS — BP 147/95 | HR 65 | Temp 98.1°F | Resp 18 | Ht 63.0 in | Wt 210.0 lb

## 2018-10-28 DIAGNOSIS — M5137 Other intervertebral disc degeneration, lumbosacral region: Secondary | ICD-10-CM | POA: Insufficient documentation

## 2018-10-28 DIAGNOSIS — M5416 Radiculopathy, lumbar region: Secondary | ICD-10-CM

## 2018-10-28 DIAGNOSIS — G894 Chronic pain syndrome: Secondary | ICD-10-CM | POA: Diagnosis present

## 2018-10-28 MED ORDER — DICLOFENAC SODIUM 75 MG PO TBEC: 75.0000 mg | DELAYED_RELEASE_TABLET | Freq: Two times a day (BID) | ORAL | 5 refills | Status: AC

## 2018-10-28 NOTE — Progress Notes (Signed)
Safety precautions to be maintained throughout the outpatient stay will include: orient to surroundings, keep bed in low position, maintain call bell within reach at all times, provide assistance with transfer out of bed and ambulation.  

## 2018-10-28 NOTE — Progress Notes (Signed)
Patient's Name: Cheyenne Walker  MRN: [id number]  Referring Provider: Steele Sizer, MD  DOB: 1971/05/15  PCP: Steele Sizer, MD  DOS: 10/28/2018  Note by: Gillis Santa, MD  Service setting: Ambulatory outpatient  Specialty: Interventional Pain Management  Location: [facility] (AMB) Pain Management Facility    Patient type: Established   Primary Reason(s) for Visit: Evaluation of chronic illnesses with exacerbation, or progression (Level of risk: moderate) CC: Back Pain (low)  HPI  Cheyenne Walker is a 48 y.o. year old, female patient, who comes today for a follow-up evaluation. She has Anxiety; Benign essential HTN; Allergic rhinitis; Gastro-esophageal reflux disease without esophagitis; Major depressive episode; Morbid obesity with BMI of 40.0-44.9, adult ([place]); Dysmetabolic syndrome; Numerous moles; Plantar fasciitis; PTSD (post-traumatic stress disorder); Vitamin D deficiency; Umbilical hernia without obstruction and without gangrene; DDD (degenerative disc disease), lumbosacral; Lumbar radiculopathy (L5); Morbid obesity ([place]); and Chronic pain syndrome on their problem list. Cheyenne Walker was last seen on 10/08/2018. Her primarily concern today is the Back Pain (low)  Pain Assessment: Location: Lower Back Radiating: denies Onset: More than a month ago Duration: Chronic pain Quality: Aching Severity: 2 /10 (subjective, self-reported pain score)  Note: Reported level is compatible with observation.                         When using our objective Pain Scale, levels between 6 and 10/10 are said to belong in an emergency room, as it progressively worsens from a 6/10, described as severely limiting, requiring emergency care not usually available at an outpatient pain management facility. At a 6/10 level, communication becomes difficult and requires great effort. Assistance to reach the emergency department may be required. Facial flushing and profuse sweating along with potentially dangerous increases in heart  rate and blood pressure will be evident. Effect on ADL:   Timing: Intermittent Modifying factors: diclofenac BP: (!) 147/95  HR: 65  Further details on both, my assessment(s), as well as the proposed treatment plan, please see below.  Patient follows up today to discuss medication management.  Patient did not find benefit with gabapentin or tizanidine.  Patient states that tizanidine made her sedated.  She has been taking diclofenac 75 mg twice a day which he states is very beneficial for her axial low back pain.  She states that she has improved range of motion and is able to function without as much pain.  She would like to continue on diclofenac.  Laboratory Chemistry  Inflammation Markers (CRP: Acute Phase) (ESR: Chronic Phase) No results found for: CRP, ESRSEDRATE, LATICACIDVEN                       Rheumatology Markers No results found for: RF, ANA, LABURIC, URICUR, LYMEIGGIGMAB, LYMEABIGMQN, HLAB27                      Renal Function Markers Lab Results  Component Value Date   BUN 20 10/31/2017   CREATININE 0.84 24/23/5361   BCR NOT APPLICABLE 44/31/5400   GFRAA >60 10/15/2016   GFRNONAA >60 10/15/2016                             Hepatic Function Markers Lab Results  Component Value Date   AST 22 10/31/2017   ALT 25 10/31/2017   ALBUMIN 3.8 10/28/2014   ALKPHOS 62 10/28/2014  Electrolytes Lab Results  Component Value Date   NA 140 10/31/2017   K 3.7 10/31/2017   CL 104 10/31/2017   CALCIUM 9.6 10/31/2017                        Neuropathy Markers Lab Results  Component Value Date   VITAMINB12 418 10/31/2017   HGBA1C 5.0 10/31/2017   HIV NONREACTIVE 01/06/2017                        CNS Tests No results found for: COLORCSF, APPEARCSF, RBCCOUNTCSF, WBCCSF, POLYSCSF, LYMPHSCSF, EOSCSF, PROTEINCSF, GLUCCSF, JCVIRUS, CSFOLI, IGGCSF                      Bone Pathology Markers Lab Results  Component Value Date   VD25OH 20 (L)  10/31/2017                         Coagulation Parameters Lab Results  Component Value Date   PLT 380 10/31/2017                        Cardiovascular Markers Lab Results  Component Value Date   TROPONINI <0.03 10/15/2016   HGB 13.7 10/31/2017   HCT 41.1 10/31/2017                         CA Markers No results found for: CEA, CA125, LABCA2                      Endocrine Markers Lab Results  Component Value Date   TSH 1.61 10/31/2017                        Note: Lab results reviewed.  Recent Diagnostic Imaging Review   Lumbosacral Imaging: Lumbar MR wo contrast:  Results for orders placed during the hospital encounter of 01/24/17  MR LUMBAR SPINE WO CONTRAST   Narrative CLINICAL DATA:  48 year old female with 6 months of lumbar back pain radiating down the left leg with numbness. Symptoms increase at night. No known injury.  EXAM: MRI LUMBAR SPINE WITHOUT CONTRAST  TECHNIQUE: Multiplanar, multisequence MR imaging of the lumbar spine was performed. No intravenous contrast was administered.  COMPARISON:  CT Abdomen and Pelvis 10/28/2014  FINDINGS: Segmentation:  Normal is demonstrated on the comparison CT.  Alignment: Stable since 2016. Subtle retrolisthesis of L5 on S1. Mild straightening of upper lumbar lordosis.  Vertebrae: Degenerative mild endplate marrow edema at L5-S1. Elsewhere bone marrow signal is within normal limits. No other acute osseous abnormality identified. Negative visible sacrum.  Conus medullaris: Extends to the T12-L1 level and appears normal.  Paraspinal and other soft tissues: Negative.  Disc levels:  T11-T12:  Mild facet hypertrophy.  T12-L1: Mild disc desiccation. Mild circumferential disc bulge with superimposed small broad-based central disc protrusion. No significant stenosis.  L1-L2: Disc desiccation. Mild but anterior eccentric circumferential disc bulge. Mild endplate spurring. Mild facet hypertrophy. Borderline to  mild bilateral L1 foraminal stenosis.  L2-L3: Mild disc desiccation. Mild circumferential disc bulge with subtle superimposed central disc protrusion with annular fissure (series 5, image 17). No stenosis.  L3-L4:  Mild facet hypertrophy.  L4-L5: Mild disc desiccation. Small broad-based central disc protrusion with subtle annular fissure. Borderline to mild facet hypertrophy. No stenosis.  L5-S1: Chronic disc space  loss and vacuum disc. Circumferential disc osteophyte complex with broad-based posterior component. Mild facet hypertrophy. Moderate left and mild right L5 foraminal stenosis appears chronic and stable.  IMPRESSION: 1. Lumbar disc degeneration except at L3-L4. Intermittent small disc herniations, but no lumbar spinal stenosis. 2. Chronic disc and acute on chronic endplate degeneration at L5-S1 contributes to moderate left and mild right L5 neural foraminal stenosis which appears stable since the 2016 CT Abdomen and Pelvis. Query left L5 radiculitis.   Electronically Signed   By: Genevie Ann M.D.   On: 01/24/2017 15:58       Complexity Note: Imaging results reviewed. Results shared with Ms. Malmquist, using Layman's terms.                         Meds   Current Outpatient Medications:  .  fluticasone (FLONASE) 50 MCG/ACT nasal spray, SPRAY 2 SPRAYS (100 MCG) IN EACH NOSTRIL BY INTRANASAL ROUTE ONCE DAILY, Disp: , Rfl: 4 .  triamterene-hydrochlorothiazide (MAXZIDE-25) 37.5-25 MG tablet, TAKE 1 TABLET BY MOUTH EVERY DAY, Disp: 90 tablet, Rfl: 0 .  diclofenac (VOLTAREN) 75 MG EC tablet, Take 1 tablet (75 mg total) by mouth 2 (two) times daily., Disp: 60 tablet, Rfl: 5  ROS  Constitutional: Denies any fever or chills Gastrointestinal: No reported hemesis, hematochezia, vomiting, or acute GI distress Musculoskeletal: Denies any acute onset joint swelling, redness, loss of ROM, or weakness Neurological: No reported episodes of acute onset apraxia, aphasia, dysarthria,  agnosia, amnesia, paralysis, loss of coordination, or loss of consciousness  Allergies  Ms. Merkley is allergic to ace inhibitors.  PFSH  Drug: Ms. Blando  reports no history of drug use. Alcohol:  reports no history of alcohol use. Tobacco:  reports that she has never smoked. She has never used smokeless tobacco. Medical:  has a past medical history of Hypertension. Surgical: Ms. Shimizu  has a past surgical history that includes Hernia repair (10/2014) and Cholecystectomy (2006). Family: family history includes Hypertension in her brother, mother, and sister.  Constitutional Exam  General appearance: Well nourished, well developed, and well hydrated. In no apparent acute distress Vitals:   10/28/18 1327  BP: (!) 147/95  Pulse: 65  Resp: 18  Temp: 98.1 F (36.7 C)  TempSrc: Oral  SpO2: 98%  Weight: 210 lb (95.3 kg)  Height: 5' 3" (1.6 m)   BMI Assessment: Estimated body mass index is 37.2 kg/m as calculated from the following:   Height as of this encounter: 5' 3" (1.6 m).   Weight as of this encounter: 210 lb (95.3 kg).  BMI interpretation table: BMI level Category Range association with higher incidence of chronic pain  <18 kg/m2 Underweight   18.5-24.9 kg/m2 Ideal body weight   25-29.9 kg/m2 Overweight Increased incidence by 20%  30-34.9 kg/m2 Obese (Class I) Increased incidence by 68%  35-39.9 kg/m2 Severe obesity (Class II) Increased incidence by 136%  >40 kg/m2 Extreme obesity (Class III) Increased incidence by 254%   Patient's current BMI Ideal Body weight  Body mass index is 37.2 kg/m. Ideal body weight: 52.4 kg (115 lb 8.3 oz) Adjusted ideal body weight: 69.5 kg (153 lb 5 oz)   BMI Readings from Last 4 Encounters:  10/28/18 37.20 kg/m  10/08/18 38.97 kg/m  08/31/18 39.31 kg/m  08/13/18 38.97 kg/m   Wt Readings from Last 4 Encounters:  10/28/18 210 lb (95.3 kg)  10/08/18 220 lb (99.8 kg)  08/31/18 229 lb (103.9 kg)  08/13/18 220  lb (99.8 kg)   Psych/Mental status: Alert, oriented x 3 (person, place, & time)       Eyes: PERLA Respiratory: No evidence of acute respiratory distress  Cervical Spine Area Exam  Skin & Axial Inspection: No masses, redness, edema, swelling, or associated skin lesions Alignment: Symmetrical Functional ROM: Unrestricted ROM      Stability: No instability detected Muscle Tone/Strength: Functionally intact. No obvious neuro-muscular anomalies detected. Sensory (Neurological): Unimpaired Palpation: No palpable anomalies              Upper Extremity (UE) Exam    Side: Right upper extremity  Side: Left upper extremity  Skin & Extremity Inspection: Skin color, temperature, and hair growth are WNL. No peripheral edema or cyanosis. No masses, redness, swelling, asymmetry, or associated skin lesions. No contractures.  Skin & Extremity Inspection: Skin color, temperature, and hair growth are WNL. No peripheral edema or cyanosis. No masses, redness, swelling, asymmetry, or associated skin lesions. No contractures.  Functional ROM: Unrestricted ROM          Functional ROM: Unrestricted ROM          Muscle Tone/Strength: Functionally intact. No obvious neuro-muscular anomalies detected.  Muscle Tone/Strength: Functionally intact. No obvious neuro-muscular anomalies detected.  Sensory (Neurological): Unimpaired          Sensory (Neurological): Unimpaired          Palpation: No palpable anomalies              Palpation: No palpable anomalies              Provocative Test(s):  Phalen's test: deferred Tinel's test: deferred Apley's scratch test (touch opposite shoulder):  Action 1 (Across chest): deferred Action 2 (Overhead): deferred Action 3 (LB reach): deferred   Provocative Test(s):  Phalen's test: deferred Tinel's test: deferred Apley's scratch test (touch opposite shoulder):  Action 1 (Across chest): deferred Action 2 (Overhead): deferred Action 3 (LB reach): deferred    Thoracic Spine Area Exam  Skin &  Axial Inspection: No masses, redness, or swelling Alignment: Symmetrical Functional ROM: Unrestricted ROM Stability: No instability detected Muscle Tone/Strength: Functionally intact. No obvious neuro-muscular anomalies detected. Sensory (Neurological): Unimpaired Muscle strength & Tone: No palpable anomalies  Lumbar Spine Area Exam  Skin & Axial Inspection: No masses, redness, or swelling Alignment: Symmetrical Functional ROM: Pain restricted ROM       Stability: No instability detected Muscle Tone/Strength: Functionally intact. No obvious neuro-muscular anomalies detected. Sensory (Neurological): Musculoskeletal pain pattern Palpation: No palpable anomalies       Provocative Tests: Hyperextension/rotation test: deferred today       Lumbar quadrant test (Kemp's test): deferred today       Lateral bending test: deferred today       Patrick's Maneuver: deferred today                   FABER* test: deferred today                   S-I anterior distraction/compression test: deferred today         S-I lateral compression test: deferred today         S-I Thigh-thrust test: deferred today         S-I Gaenslen's test: deferred today         *(Flexion, ABduction and External Rotation)  Gait & Posture Assessment  Ambulation: Unassisted Gait: Relatively normal for age and body habitus Posture: WNL   Lower Extremity Exam  Side: Right lower extremity  Side: Left lower extremity  Stability: No instability observed          Stability: No instability observed          Skin & Extremity Inspection: Skin color, temperature, and hair growth are WNL. No peripheral edema or cyanosis. No masses, redness, swelling, asymmetry, or associated skin lesions. No contractures.  Skin & Extremity Inspection: Skin color, temperature, and hair growth are WNL. No peripheral edema or cyanosis. No masses, redness, swelling, asymmetry, or associated skin lesions. No contractures.  Functional ROM: Unrestricted ROM                   Functional ROM: Unrestricted ROM                  Muscle Tone/Strength: Functionally intact. No obvious neuro-muscular anomalies detected.  Muscle Tone/Strength: Functionally intact. No obvious neuro-muscular anomalies detected.  Sensory (Neurological): Unimpaired        Sensory (Neurological): Unimpaired        DTR: Patellar: deferred today Achilles: deferred today Plantar: deferred today  DTR: Patellar: deferred today Achilles: deferred today Plantar: deferred today  Palpation: No palpable anomalies  Palpation: No palpable anomalies   Assessment  Primary Diagnosis & Pertinent Problem List: The primary encounter diagnosis was Lumbar radiculopathy (L5). Diagnoses of Morbid obesity ([place]), Chronic pain syndrome, and Degeneration of lumbar or lumbosacral intervertebral disc were also pertinent to this visit.  Status Diagnosis  Controlled Controlled Controlled 1. Lumbar radiculopathy (L5)   2. Morbid obesity ([place])   3. Chronic pain syndrome   4. Degeneration of lumbar or lumbosacral intervertebral disc      Patient is overall doing well.  I have instructed her to discontinue her gabapentin and tizanidine as it is not beneficial.  Discussed risks and benefits of being on long-term NSAID therapy however given that patient is active during the day and prefers a non-sedative medication, this may be the best option.  I encouraged the patient to take this medication after meal and that if her pain is not as bad in a given week to take only 1 a day.  I also encouraged the patient to continue hydrating herself.  Risks and benefits of long-term NSAID therapy were discussed with patient.  Refill of diclofenac as below.  We will also place order for as needed repeat lumbar epidural steroid injection at L5-S1 for lumbar radiculopathy if return of radicular symptoms.  Plan of Care  Pharmacotherapy (Medications Ordered): Meds ordered this encounter  Medications  . diclofenac (VOLTAREN)  75 MG EC tablet    Sig: Take 1 tablet (75 mg total) by mouth 2 (two) times daily.    Dispense:  60 tablet    Refill:  5   Lab-work, procedure(s), and/or referral(s): Orders Placed This Encounter  Procedures  . Lumbar Epidural Injection      PRN Procedures:   L5-S1 ESI   Provider-requested follow-up: Return if symptoms worsen or fail to improve.  Future Appointments  Date Time Provider Post  12/15/2018  2:20 PM Steele Sizer, MD [facility]    Primary Care Physician: Steele Sizer, MD Location: [facility] Note by: Gillis Santa, M.D Date: 10/28/2018; Time: 2:46 PM  Patient Instructions   Rx for Diclofenac with 5 refills has been escribed to your pharmacy.   GENERAL RISKS AND COMPLICATIONS  What are the risk, side effects and possible complications? Generally speaking, most procedures are safe.  However, with any procedure  there are risks, side effects, and the possibility of complications.  The risks and complications are dependent upon the sites that are lesioned, or the type of nerve block to be performed.  The closer the procedure is to the spine, the more serious the risks are.  Great care is taken when placing the radio frequency needles, block needles or lesioning probes, but sometimes complications can occur. 1. Infection: Any time there is an injection through the skin, there is a risk of infection.  This is why sterile conditions are used for these blocks.  There are four possible types of infection. 1. Localized skin infection. 2. Central Nervous System Infection-This can be in the form of Meningitis, which can be deadly. 3. Epidural Infections-This can be in the form of an epidural abscess, which can cause pressure inside of the spine, causing compression of the spinal cord with subsequent paralysis. This would require an emergency surgery to decompress, and there are no guarantees that the patient would recover from the  paralysis. 4. Discitis-This is an infection of the intervertebral discs.  It occurs in about 1% of discography procedures.  It is difficult to treat and it may lead to surgery.        2. Pain: the needles have to go through skin and soft tissues, will cause soreness.       3. Damage to internal structures:  The nerves to be lesioned may be near blood vessels or    other nerves which can be potentially damaged.       4. Bleeding: Bleeding is more common if the patient is taking blood thinners such as  aspirin, Coumadin, Ticiid, Plavix, etc., or if he/she have some genetic predisposition  such as hemophilia. Bleeding into the spinal canal can cause compression of the spinal  cord with subsequent paralysis.  This would require an emergency surgery to  decompress and there are no guarantees that the patient would recover from the  paralysis.       5. Pneumothorax:  Puncturing of a lung is a possibility, every time a needle is introduced in  the area of the chest or upper back.  Pneumothorax refers to free air around the  collapsed lung(s), inside of the thoracic cavity (chest cavity).  Another two possible  complications related to a similar event would include: Hemothorax and Chylothorax.   These are variations of the Pneumothorax, where instead of air around the collapsed  lung(s), you may have blood or chyle, respectively.       6. Spinal headaches: They may occur with any procedures in the area of the spine.       7. Persistent CSF (Cerebro-Spinal Fluid) leakage: This is a rare problem, but may occur  with prolonged intrathecal or epidural catheters either due to the formation of a fistulous  track or a dural tear.       8. Nerve damage: By working so close to the spinal cord, there is always a possibility of  nerve damage, which could be as serious as a permanent spinal cord injury with  paralysis.       9. Death:  Although rare, severe deadly allergic reactions known as "Anaphylactic  reaction" can  occur to any of the medications used.      10. Worsening of the symptoms:  We can always make thing worse.  What are the chances of something like this happening? Chances of any of this occuring are extremely low.  By statistics, you have  more of a chance of getting killed in a motor vehicle accident: while driving to the hospital than any of the above occurring .  Nevertheless, you should be aware that they are possibilities.  In general, it is similar to taking a shower.  Everybody knows that you can slip, hit your head and get killed.  Does that mean that you should not shower again?  Nevertheless always keep in mind that statistics do not mean anything if you happen to be on the wrong side of them.  Even if a procedure has a 1 (one) in a 1,000,000 (million) chance of going wrong, it you happen to be that one..Also, keep in mind that by statistics, you have more of a chance of having something go wrong when taking medications.  Who should not have this procedure? If you are on a blood thinning medication (e.g. Coumadin, Plavix, see list of "Blood Thinners"), or if you have an active infection going on, you should not have the procedure.  If you are taking any blood thinners, please inform your physician.  How should I prepare for this procedure?  Do not eat or drink anything at least six hours prior to the procedure.  Bring a driver with you .  It cannot be a taxi.  Come accompanied by an adult that can drive you back, and that is strong enough to help you if your legs get weak or numb from the local anesthetic.  Take all of your medicines the morning of the procedure with just enough water to swallow them.  If you have diabetes, make sure that you are scheduled to have your procedure done first thing in the morning, whenever possible.  If you have diabetes, take only half of your insulin dose and notify our nurse that you have done so as soon as you arrive at the clinic.  If you are  diabetic, but only take blood sugar pills (oral hypoglycemic), then do not take them on the morning of your procedure.  You may take them after you have had the procedure.  Do not take aspirin or any aspirin-containing medications, at least eleven (11) days prior to the procedure.  They may prolong bleeding.  Wear loose fitting clothing that may be easy to take off and that you would not mind if it got stained with Betadine or blood.  Do not wear any jewelry or perfume  Remove any nail coloring.  It will interfere with some of our monitoring equipment.  NOTE: Remember that this is not meant to be interpreted as a complete list of all possible complications.  Unforeseen problems may occur.  BLOOD THINNERS The following drugs contain aspirin or other products, which can cause increased bleeding during surgery and should not be taken for 2 weeks prior to and 1 week after surgery.  If you should need take something for relief of minor pain, you may take acetaminophen which is found in Tylenol,m Datril, Anacin-3 and Panadol. It is not blood thinner. The products listed below are.  Do not take any of the products listed below in addition to any listed on your instruction sheet.  A.P.C or A.P.C with Codeine Codeine Phosphate Capsules #3 Ibuprofen Ridaura  ABC compound Congesprin Imuran rimadil  Advil Cope Indocin Robaxisal  Alka-Seltzer Effervescent Pain Reliever and Antacid Coricidin or Coricidin-D  Indomethacin Rufen  Alka-Seltzer plus Cold Medicine Cosprin Ketoprofen S-A-C Tablets  Anacin Analgesic Tablets or Capsules Coumadin Korlgesic Salflex  Anacin Extra Strength Analgesic tablets or  capsules CP-2 Tablets Lanoril Salicylate  Anaprox Cuprimine Capsules Levenox Salocol  Anexsia-D Dalteparin Magan Salsalate  Anodynos Darvon compound Magnesium Salicylate Sine-off  Ansaid Dasin Capsules Magsal Sodium Salicylate  Anturane Depen Capsules Marnal Soma  APF Arthritis pain formula Dewitt's Pills  Measurin Stanback  Argesic Dia-Gesic Meclofenamic Sulfinpyrazone  Arthritis Bayer Timed Release Aspirin Diclofenac Meclomen Sulindac  Arthritis pain formula Anacin Dicumarol Medipren Supac  Analgesic (Safety coated) Arthralgen Diffunasal Mefanamic Suprofen  Arthritis Strength Bufferin Dihydrocodeine Mepro Compound Suprol  Arthropan liquid Dopirydamole Methcarbomol with Aspirin Synalgos  ASA tablets/Enseals Disalcid Micrainin Tagament  Ascriptin Doan's Midol Talwin  Ascriptin A/D Dolene Mobidin Tanderil  Ascriptin Extra Strength Dolobid Moblgesic Ticlid  Ascriptin with Codeine Doloprin or Doloprin with Codeine Momentum Tolectin  Asperbuf Duoprin Mono-gesic Trendar  Aspergum Duradyne Motrin or Motrin IB Triminicin  Aspirin plain, buffered or enteric coated Durasal Myochrisine Trigesic  Aspirin Suppositories Easprin Nalfon Trillsate  Aspirin with Codeine Ecotrin Regular or Extra Strength Naprosyn Uracel  Atromid-S Efficin Naproxen Ursinus  Auranofin Capsules Elmiron Neocylate Vanquish  Axotal Emagrin Norgesic Verin  Azathioprine Empirin or Empirin with Codeine Normiflo Vitamin E  Azolid Emprazil Nuprin Voltaren  Bayer Aspirin plain, buffered or children's or timed BC Tablets or powders Encaprin Orgaran Warfarin Sodium  Buff-a-Comp Enoxaparin Orudis Zorpin  Buff-a-Comp with Codeine Equegesic Os-Cal-Gesic   Buffaprin Excedrin plain, buffered or Extra Strength Oxalid   Bufferin Arthritis Strength Feldene Oxphenbutazone   Bufferin plain or Extra Strength Feldene Capsules Oxycodone with Aspirin   Bufferin with Codeine Fenoprofen Fenoprofen Pabalate or Pabalate-SF   Buffets II Flogesic Panagesic   Buffinol plain or Extra Strength Florinal or Florinal with Codeine Panwarfarin   Buf-Tabs Flurbiprofen Penicillamine   Butalbital Compound Four-way cold tablets Penicillin   Butazolidin Fragmin Pepto-Bismol   Carbenicillin Geminisyn Percodan   Carna Arthritis Reliever Geopen Persantine    Carprofen Gold's salt Persistin   Chloramphenicol Goody's Phenylbutazone   Chloromycetin Haltrain Piroxlcam   Clmetidine heparin Plaquenil   Cllnoril Hyco-pap Ponstel   Clofibrate Hydroxy chloroquine Propoxyphen         Before stopping any of these medications, be sure to consult the physician who ordered them.  Some, such as Coumadin (Warfarin) are ordered to prevent or treat serious conditions such as "deep thrombosis", "pumonary embolisms", and other heart problems.  The amount of time that you may need off of the medication may also vary with the medication and the reason for which you were taking it.  If you are taking any of these medications, please make sure you notify your pain physician before you undergo any procedures.         Epidural Steroid Injection Patient Information  Description: The epidural space surrounds the nerves as they exit the spinal cord.  In some patients, the nerves can be compressed and inflamed by a bulging disc or a tight spinal canal (spinal stenosis).  By injecting steroids into the epidural space, we can bring irritated nerves into direct contact with a potentially helpful medication.  These steroids act directly on the irritated nerves and can reduce swelling and inflammation which often leads to decreased pain.  Epidural steroids may be injected anywhere along the spine and from the neck to the low back depending upon the location of your pain.   After numbing the skin with local anesthetic (like Novocaine), a small needle is passed into the epidural space slowly.  You may experience a sensation of pressure while this is being done.  The entire block usually last  less than 10 minutes.  Conditions which may be treated by epidural steroids:   Low back and leg pain  Neck and arm pain  Spinal stenosis  Post-laminectomy syndrome  Herpes zoster (shingles) pain  Pain from compression fractures  Preparation for the injection:  1. Do not eat any  solid food or dairy products within 8 hours of your appointment.  2. You may drink clear liquids up to 3 hours before appointment.  Clear liquids include water, black coffee, juice or soda.  No milk or cream please. 3. You may take your regular medication, including pain medications, with a sip of water before your appointment  Diabetics should hold regular insulin (if taken separately) and take 1/2 normal NPH dos the morning of the procedure.  Carry some sugar containing items with you to your appointment. 4. A driver must accompany you and be prepared to drive you home after your procedure.  5. Bring all your current medications with your. 6. An IV may be inserted and sedation may be given at the discretion of the physician.   7. A blood pressure cuff, EKG and other monitors will often be applied during the procedure.  Some patients may need to have extra oxygen administered for a short period. 8. You will be asked to provide medical information, including your allergies, prior to the procedure.  We must know immediately if you are taking blood thinners (like Coumadin/Warfarin)  Or if you are allergic to IV iodine contrast (dye). We must know if you could possible be pregnant.  Possible side-effects:  Bleeding from needle site  Infection (rare, may require surgery)  Nerve injury (rare)  Numbness & tingling (temporary)  Difficulty urinating (rare, temporary)  Spinal headache ( a headache worse with upright posture)  Light -headedness (temporary)  Pain at injection site (several days)  Decreased blood pressure (temporary)  Weakness in arm/leg (temporary)  Pressure sensation in back/neck (temporary)  Call if you experience:  Fever/chills associated with headache or increased back/neck pain.  Headache worsened by an upright position.  New onset weakness or numbness of an extremity below the injection site  Hives or difficulty breathing (go to the emergency room)  Inflammation  or drainage at the infection site  Severe back/neck pain  Any new symptoms which are concerning to you  Please note:  Although the local anesthetic injected can often make your back or neck feel good for several hours after the injection, the pain will likely return.  It takes 3-7 days for steroids to work in the epidural space.  You may not notice any pain relief for at least that one week.  If effective, we will often do a series of three injections spaced 3-6 weeks apart to maximally decrease your pain.  After the initial series, we generally will wait several months before considering a repeat injection of the same type.  If you have any questions, please call [phone] [facility]

## 2018-10-28 NOTE — Patient Instructions (Signed)
Rx for Diclofenac with 5 refills has been escribed to your pharmacy.   GENERAL RISKS AND COMPLICATIONS  What are the risk, side effects and possible complications? Generally speaking, most procedures are safe.  However, with any procedure there are risks, side effects, and the possibility of complications.  The risks and complications are dependent upon the sites that are lesioned, or the type of nerve block to be performed.  The closer the procedure is to the spine, the more serious the risks are.  Great care is taken when placing the radio frequency needles, block needles or lesioning probes, but sometimes complications can occur. 1. Infection: Any time there is an injection through the skin, there is a risk of infection.  This is why sterile conditions are used for these blocks.  There are four possible types of infection. 1. Localized skin infection. 2. Central Nervous System Infection-This can be in the form of Meningitis, which can be deadly. 3. Epidural Infections-This can be in the form of an epidural abscess, which can cause pressure inside of the spine, causing compression of the spinal cord with subsequent paralysis. This would require an emergency surgery to decompress, and there are no guarantees that the patient would recover from the paralysis. 4. Discitis-This is an infection of the intervertebral discs.  It occurs in about 1% of discography procedures.  It is difficult to treat and it may lead to surgery.        2. Pain: the needles have to go through skin and soft tissues, will cause soreness.       3. Damage to internal structures:  The nerves to be lesioned may be near blood vessels or    other nerves which can be potentially damaged.       4. Bleeding: Bleeding is more common if the patient is taking blood thinners such as  aspirin, Coumadin, Ticiid, Plavix, etc., or if he/she have some genetic predisposition  such as hemophilia. Bleeding into the spinal canal can cause  compression of the spinal  cord with subsequent paralysis.  This would require an emergency surgery to  decompress and there are no guarantees that the patient would recover from the  paralysis.       5. Pneumothorax:  Puncturing of a lung is a possibility, every time a needle is introduced in  the area of the chest or upper back.  Pneumothorax refers to free air around the  collapsed lung(s), inside of the thoracic cavity (chest cavity).  Another two possible  complications related to a similar event would include: Hemothorax and Chylothorax.   These are variations of the Pneumothorax, where instead of air around the collapsed  lung(s), you may have blood or chyle, respectively.       6. Spinal headaches: They may occur with any procedures in the area of the spine.       7. Persistent CSF (Cerebro-Spinal Fluid) leakage: This is a rare problem, but may occur  with prolonged intrathecal or epidural catheters either due to the formation of a fistulous  track or a dural tear.       8. Nerve damage: By working so close to the spinal cord, there is always a possibility of  nerve damage, which could be as serious as a permanent spinal cord injury with  paralysis.       9. Death:  Although rare, severe deadly allergic reactions known as "Anaphylactic  reaction" can occur to any of the medications used.  10. Worsening of the symptoms:  We can always make thing worse.  What are the chances of something like this happening? Chances of any of this occuring are extremely low.  By statistics, you have more of a chance of getting killed in a motor vehicle accident: while driving to the hospital than any of the above occurring .  Nevertheless, you should be aware that they are possibilities.  In general, it is similar to taking a shower.  Everybody knows that you can slip, hit your head and get killed.  Does that mean that you should not shower again?  Nevertheless always keep in mind that statistics do not mean  anything if you happen to be on the wrong side of them.  Even if a procedure has a 1 (one) in a 1,000,000 (million) chance of going wrong, it you happen to be that one..Also, keep in mind that by statistics, you have more of a chance of having something go wrong when taking medications.  Who should not have this procedure? If you are on a blood thinning medication (e.g. Coumadin, Plavix, see list of "Blood Thinners"), or if you have an active infection going on, you should not have the procedure.  If you are taking any blood thinners, please inform your physician.  How should I prepare for this procedure?  Do not eat or drink anything at least six hours prior to the procedure.  Bring a driver with you .  It cannot be a taxi.  Come accompanied by an adult that can drive you back, and that is strong enough to help you if your legs get weak or numb from the local anesthetic.  Take all of your medicines the morning of the procedure with just enough water to swallow them.  If you have diabetes, make sure that you are scheduled to have your procedure done first thing in the morning, whenever possible.  If you have diabetes, take only half of your insulin dose and notify our nurse that you have done so as soon as you arrive at the clinic.  If you are diabetic, but only take blood sugar pills (oral hypoglycemic), then do not take them on the morning of your procedure.  You may take them after you have had the procedure.  Do not take aspirin or any aspirin-containing medications, at least eleven (11) days prior to the procedure.  They may prolong bleeding.  Wear loose fitting clothing that may be easy to take off and that you would not mind if it got stained with Betadine or blood.  Do not wear any jewelry or perfume  Remove any nail coloring.  It will interfere with some of our monitoring equipment.  NOTE: Remember that this is not meant to be interpreted as a complete list of all possible  complications.  Unforeseen problems may occur.  BLOOD THINNERS The following drugs contain aspirin or other products, which can cause increased bleeding during surgery and should not be taken for 2 weeks prior to and 1 week after surgery.  If you should need take something for relief of minor pain, you may take acetaminophen which is found in Tylenol,m Datril, Anacin-3 and Panadol. It is not blood thinner. The products listed below are.  Do not take any of the products listed below in addition to any listed on your instruction sheet.  A.P.C or A.P.C with Codeine Codeine Phosphate Capsules #3 Ibuprofen Ridaura  ABC compound Congesprin Imuran rimadil  Advil Cope Indocin Robaxisal  Alka-Seltzer Effervescent Pain Reliever and Antacid Coricidin or Coricidin-D  Indomethacin Rufen  Alka-Seltzer plus Cold Medicine Cosprin Ketoprofen S-A-C Tablets  Anacin Analgesic Tablets or Capsules Coumadin Korlgesic Salflex  Anacin Extra Strength Analgesic tablets or capsules CP-2 Tablets Lanoril Salicylate  Anaprox Cuprimine Capsules Levenox Salocol  Anexsia-D Dalteparin Magan Salsalate  Anodynos Darvon compound Magnesium Salicylate Sine-off  Ansaid Dasin Capsules Magsal Sodium Salicylate  Anturane Depen Capsules Marnal Soma  APF Arthritis pain formula Dewitt's Pills Measurin Stanback  Argesic Dia-Gesic Meclofenamic Sulfinpyrazone  Arthritis Bayer Timed Release Aspirin Diclofenac Meclomen Sulindac  Arthritis pain formula Anacin Dicumarol Medipren Supac  Analgesic (Safety coated) Arthralgen Diffunasal Mefanamic Suprofen  Arthritis Strength Bufferin Dihydrocodeine Mepro Compound Suprol  Arthropan liquid Dopirydamole Methcarbomol with Aspirin Synalgos  ASA tablets/Enseals Disalcid Micrainin Tagament  Ascriptin Doan's Midol Talwin  Ascriptin A/D Dolene Mobidin Tanderil  Ascriptin Extra Strength Dolobid Moblgesic Ticlid  Ascriptin with Codeine Doloprin or Doloprin with Codeine Momentum Tolectin  Asperbuf Duoprin  Mono-gesic Trendar  Aspergum Duradyne Motrin or Motrin IB Triminicin  Aspirin plain, buffered or enteric coated Durasal Myochrisine Trigesic  Aspirin Suppositories Easprin Nalfon Trillsate  Aspirin with Codeine Ecotrin Regular or Extra Strength Naprosyn Uracel  Atromid-S Efficin Naproxen Ursinus  Auranofin Capsules Elmiron Neocylate Vanquish  Axotal Emagrin Norgesic Verin  Azathioprine Empirin or Empirin with Codeine Normiflo Vitamin E  Azolid Emprazil Nuprin Voltaren  Bayer Aspirin plain, buffered or children's or timed BC Tablets or powders Encaprin Orgaran Warfarin Sodium  Buff-a-Comp Enoxaparin Orudis Zorpin  Buff-a-Comp with Codeine Equegesic Os-Cal-Gesic   Buffaprin Excedrin plain, buffered or Extra Strength Oxalid   Bufferin Arthritis Strength Feldene Oxphenbutazone   Bufferin plain or Extra Strength Feldene Capsules Oxycodone with Aspirin   Bufferin with Codeine Fenoprofen Fenoprofen Pabalate or Pabalate-SF   Buffets II Flogesic Panagesic   Buffinol plain or Extra Strength Florinal or Florinal with Codeine Panwarfarin   Buf-Tabs Flurbiprofen Penicillamine   Butalbital Compound Four-way cold tablets Penicillin   Butazolidin Fragmin Pepto-Bismol   Carbenicillin Geminisyn Percodan   Carna Arthritis Reliever Geopen Persantine   Carprofen Gold's salt Persistin   Chloramphenicol Goody's Phenylbutazone   Chloromycetin Haltrain Piroxlcam   Clmetidine heparin Plaquenil   Cllnoril Hyco-pap Ponstel   Clofibrate Hydroxy chloroquine Propoxyphen         Before stopping any of these medications, be sure to consult the physician who ordered them.  Some, such as Coumadin (Warfarin) are ordered to prevent or treat serious conditions such as "deep thrombosis", "pumonary embolisms", and other heart problems.  The amount of time that you may need off of the medication may also vary with the medication and the reason for which you were taking it.  If you are taking any of these medications, please  make sure you notify your pain physician before you undergo any procedures.         Epidural Steroid Injection Patient Information  Description: The epidural space surrounds the nerves as they exit the spinal cord.  In some patients, the nerves can be compressed and inflamed by a bulging disc or a tight spinal canal (spinal stenosis).  By injecting steroids into the epidural space, we can bring irritated nerves into direct contact with a potentially helpful medication.  These steroids act directly on the irritated nerves and can reduce swelling and inflammation which often leads to decreased pain.  Epidural steroids may be injected anywhere along the spine and from the neck to the low back depending upon the location of your pain.   After  numbing the skin with local anesthetic (like Novocaine), a small needle is passed into the epidural space slowly.  You may experience a sensation of pressure while this is being done.  The entire block usually last less than 10 minutes.  Conditions which may be treated by epidural steroids:   Low back and leg pain  Neck and arm pain  Spinal stenosis  Post-laminectomy syndrome  Herpes zoster (shingles) pain  Pain from compression fractures  Preparation for the injection:  1. Do not eat any solid food or dairy products within 8 hours of your appointment.  2. You may drink clear liquids up to 3 hours before appointment.  Clear liquids include water, black coffee, juice or soda.  No milk or cream please. 3. You may take your regular medication, including pain medications, with a sip of water before your appointment  Diabetics should hold regular insulin (if taken separately) and take 1/2 normal NPH dos the morning of the procedure.  Carry some sugar containing items with you to your appointment. 4. A driver must accompany you and be prepared to drive you home after your procedure.  5. Bring all your current medications with your. 6. An IV may be  inserted and sedation may be given at the discretion of the physician.   7. A blood pressure cuff, EKG and other monitors will often be applied during the procedure.  Some patients may need to have extra oxygen administered for a short period. 8. You will be asked to provide medical information, including your allergies, prior to the procedure.  We must know immediately if you are taking blood thinners (like Coumadin/Warfarin)  Or if you are allergic to IV iodine contrast (dye). We must know if you could possible be pregnant.  Possible side-effects:  Bleeding from needle site  Infection (rare, may require surgery)  Nerve injury (rare)  Numbness & tingling (temporary)  Difficulty urinating (rare, temporary)  Spinal headache ( a headache worse with upright posture)  Light -headedness (temporary)  Pain at injection site (several days)  Decreased blood pressure (temporary)  Weakness in arm/leg (temporary)  Pressure sensation in back/neck (temporary)  Call if you experience:  Fever/chills associated with headache or increased back/neck pain.  Headache worsened by an upright position.  New onset weakness or numbness of an extremity below the injection site  Hives or difficulty breathing (go to the emergency room)  Inflammation or drainage at the infection site  Severe back/neck pain  Any new symptoms which are concerning to you  Please note:  Although the local anesthetic injected can often make your back or neck feel good for several hours after the injection, the pain will likely return.  It takes 3-7 days for steroids to work in the epidural space.  You may not notice any pain relief for at least that one week.  If effective, we will often do a series of three injections spaced 3-6 weeks apart to maximally decrease your pain.  After the initial series, we generally will wait several months before considering a repeat injection of the same type.  If you have any  questions, please call [phone] [facility]

## 2018-12-15 ENCOUNTER — Encounter: Payer: 59 | Admitting: Family Medicine

## 2018-12-16 ENCOUNTER — Ambulatory Visit (INDEPENDENT_AMBULATORY_CARE_PROVIDER_SITE_OTHER): Payer: 59 | Admitting: Family Medicine

## 2018-12-16 ENCOUNTER — Other Ambulatory Visit: Payer: Self-pay

## 2018-12-16 ENCOUNTER — Other Ambulatory Visit (HOSPITAL_COMMUNITY)
Admission: RE | Admit: 2018-12-16 | Discharge: 2018-12-16 | Disposition: A | Discharge status: Home / Self Care | Payer: 59 | Source: Ambulatory Visit | Attending: Family Medicine | Admitting: Family Medicine

## 2018-12-16 ENCOUNTER — Other Ambulatory Visit: Payer: Self-pay | Admitting: Family Medicine

## 2018-12-16 ENCOUNTER — Encounter: Payer: Self-pay | Admitting: Family Medicine

## 2018-12-16 ENCOUNTER — Encounter: Payer: 59 | Admitting: Family Medicine

## 2018-12-16 ENCOUNTER — Telehealth: Payer: Self-pay | Admitting: Family Medicine

## 2018-12-16 VITALS — BP 142/86 | HR 72 | Temp 98.4°F | Resp 16 | Ht 62.0 in | Wt 241.8 lb

## 2018-12-16 DIAGNOSIS — F339 Major depressive disorder, recurrent, unspecified: Secondary | ICD-10-CM

## 2018-12-16 DIAGNOSIS — R87810 Cervical high risk human papillomavirus (HPV) DNA test positive: Secondary | ICD-10-CM

## 2018-12-16 DIAGNOSIS — Z1239 Encounter for other screening for malignant neoplasm of breast: Secondary | ICD-10-CM

## 2018-12-16 DIAGNOSIS — Z124 Encounter for screening for malignant neoplasm of cervix: Secondary | ICD-10-CM | POA: Insufficient documentation

## 2018-12-16 DIAGNOSIS — E559 Vitamin D deficiency, unspecified: Secondary | ICD-10-CM

## 2018-12-16 DIAGNOSIS — M5416 Radiculopathy, lumbar region: Secondary | ICD-10-CM

## 2018-12-16 DIAGNOSIS — E8881 Metabolic syndrome: Secondary | ICD-10-CM

## 2018-12-16 DIAGNOSIS — R87610 Atypical squamous cells of undetermined significance on cytologic smear of cervix (ASC-US): Secondary | ICD-10-CM

## 2018-12-16 DIAGNOSIS — Z01419 Encounter for gynecological examination (general) (routine) without abnormal findings: Secondary | ICD-10-CM

## 2018-12-16 DIAGNOSIS — E786 Lipoprotein deficiency: Secondary | ICD-10-CM

## 2018-12-16 DIAGNOSIS — Z131 Encounter for screening for diabetes mellitus: Secondary | ICD-10-CM

## 2018-12-16 DIAGNOSIS — I10 Essential (primary) hypertension: Secondary | ICD-10-CM

## 2018-12-16 MED ORDER — TRIAMTERENE-HCTZ 37.5-25 MG PO TABS: 1.0000 | ORAL_TABLET | Freq: Every day | ORAL | 1 refills | Status: DC

## 2018-12-16 MED ORDER — LOSARTAN POTASSIUM 50 MG PO TABS: 50.0000 mg | ORAL_TABLET | Freq: Every day | ORAL | 1 refills | Status: DC

## 2018-12-16 NOTE — Patient Instructions (Signed)
Preventive Care 40-64 Years, Female Preventive care refers to lifestyle choices and visits with your health care provider that can promote health and wellness. What does preventive care include?   A yearly physical exam. This is also called an annual well check.  Dental exams once or twice a year.  Routine eye exams. Ask your health care provider how often you should have your eyes checked.  Personal lifestyle choices, including: ? Daily care of your teeth and gums. ? Regular physical activity. ? Eating a healthy diet. ? Avoiding tobacco and drug use. ? Limiting alcohol use. ? Practicing safe sex. ? Taking low-dose aspirin daily starting at age 50. ? Taking vitamin and mineral supplements as recommended by your health care provider. What happens during an annual well check? The services and screenings done by your health care provider during your annual well check will depend on your age, overall health, lifestyle risk factors, and family history of disease. Counseling Your health care provider may ask you questions about your:  Alcohol use.  Tobacco use.  Drug use.  Emotional well-being.  Home and relationship well-being.  Sexual activity.  Eating habits.  Work and work environment.  Method of birth control.  Menstrual cycle.  Pregnancy history. Screening You may have the following tests or measurements:  Height, weight, and BMI.  Blood pressure.  Lipid and cholesterol levels. These may be checked every 5 years, or more frequently if you are over 50 years old.  Skin check.  Lung cancer screening. You may have this screening every year starting at age 55 if you have a 30-pack-year history of smoking and currently smoke or have quit within the past 15 years.  Colorectal cancer screening. All adults should have this screening starting at age 50 and continuing until age 75. Your health care provider may recommend screening at age 45. You will have tests every  1-10 years, depending on your results and the type of screening test. People at increased risk should start screening at an earlier age. Screening tests may include: ? Guaiac-based fecal occult blood testing. ? Fecal immunochemical test (FIT). ? Stool DNA test. ? Virtual colonoscopy. ? Sigmoidoscopy. During this test, a flexible tube with a tiny camera (sigmoidoscope) is used to examine your rectum and lower colon. The sigmoidoscope is inserted through your anus into your rectum and lower colon. ? Colonoscopy. During this test, a long, thin, flexible tube with a tiny camera (colonoscope) is used to examine your entire colon and rectum.  Hepatitis C blood test.  Hepatitis B blood test.  Sexually transmitted disease (STD) testing.  Diabetes screening. This is done by checking your blood sugar (glucose) after you have not eaten for a while (fasting). You may have this done every 1-3 years.  Mammogram. This may be done every 1-2 years. Talk to your health care provider about when you should start having regular mammograms. This may depend on whether you have a family history of breast cancer.  BRCA-related cancer screening. This may be done if you have a family history of breast, ovarian, tubal, or peritoneal cancers.  Pelvic exam and Pap test. This may be done every 3 years starting at age 21. Starting at age 30, this may be done every 5 years if you have a Pap test in combination with an HPV test.  Bone density scan. This is done to screen for osteoporosis. You may have this scan if you are at high risk for osteoporosis. Discuss your test results, treatment options,   and if necessary, the need for more tests with your health care provider. Vaccines Your health care provider may recommend certain vaccines, such as:  Influenza vaccine. This is recommended every year.  Tetanus, diphtheria, and acellular pertussis (Tdap, Td) vaccine. You may need a Td booster every 10 years.  Varicella  vaccine. You may need this if you have not been vaccinated.  Zoster vaccine. You may need this after age 38.  Measles, mumps, and rubella (MMR) vaccine. You may need at least one dose of MMR if you were born in 1957 or later. You may also need a second dose.  Pneumococcal 13-valent conjugate (PCV13) vaccine. You may need this if you have certain conditions and were not previously vaccinated.  Pneumococcal polysaccharide (PPSV23) vaccine. You may need one or two doses if you smoke cigarettes or if you have certain conditions.  Meningococcal vaccine. You may need this if you have certain conditions.  Hepatitis A vaccine. You may need this if you have certain conditions or if you travel or work in places where you may be exposed to hepatitis A.  Hepatitis B vaccine. You may need this if you have certain conditions or if you travel or work in places where you may be exposed to hepatitis B.  Haemophilus influenzae type b (Hib) vaccine. You may need this if you have certain conditions. Talk to your health care provider about which screenings and vaccines you need and how often you need them. This information is not intended to replace advice given to you by your health care provider. Make sure you discuss any questions you have with your health care provider. Document Released: 10/06/2015 Document Revised: 10/30/2017 Document Reviewed: 07/11/2015 Elsevier Interactive Patient Education  2019 [employer].

## 2018-12-16 NOTE — Telephone Encounter (Signed)
Pt had appt this morning for her cpe. It is cancelled per our office protocal due to covid 19. Pt is coming in to do blood work (per [employer]) and she will be fasting. Pt is on the way
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
## 2018-12-17 LAB — CBC WITH DIFFERENTIAL/PLATELET
Absolute Monocytes: 461 cells/uL (ref 200–950)
Basophils Absolute: 87 cells/uL (ref 0–200)
Basophils Relative: 1 %
Eosinophils Absolute: 305 cells/uL (ref 15–500)
Eosinophils Relative: 3.5 %
HCT: 39.1 % (ref 35.0–45.0)
Hemoglobin: 13.1 g/dL (ref 11.7–15.5)
Lymphs Abs: 3654 cells/uL (ref 850–3900)
MCH: 29.5 pg (ref 27.0–33.0)
MCHC: 33.5 g/dL (ref 32.0–36.0)
MCV: 88.1 fL (ref 80.0–100.0)
MPV: 10.9 fL (ref 7.5–12.5)
Monocytes Relative: 5.3 %
Neutro Abs: 4193 cells/uL (ref 1500–7800)
Neutrophils Relative %: 48.2 %
Platelets: 349 10*3/uL (ref 140–400)
RBC: 4.44 10*6/uL (ref 3.80–5.10)
RDW: 14 % (ref 11.0–15.0)
Total Lymphocyte: 42 %
WBC: 8.7 10*3/uL (ref 3.8–10.8)

## 2018-12-17 LAB — VITAMIN D 25 HYDROXY (VIT D DEFICIENCY, FRACTURES): Vit D, 25-Hydroxy: 18 ng/mL — ABNORMAL LOW (ref 30–100)

## 2018-12-17 LAB — LIPID PANEL
Cholesterol: 191 mg/dL (ref <200)
HDL: 52 mg/dL (ref >=50)
LDL Cholesterol (Calc): 114 mg/dL (calc) — ABNORMAL HIGH
Non-HDL Cholesterol (Calc): 139 mg/dL (calc) — ABNORMAL HIGH (ref <130)
Total CHOL/HDL Ratio: 3.7 (calc) (ref <5.0)
Triglycerides: 133 mg/dL (ref <150)

## 2018-12-17 LAB — HEMOGLOBIN A1C
Hgb A1c MFr Bld: 5.4 % of total Hgb (ref <5.7)
Mean Plasma Glucose: 108 (calc)
eAG (mmol/L): 6 (calc)

## 2018-12-17 LAB — COMPLETE METABOLIC PANEL WITH GFR
AG Ratio: 1.5 (calc) (ref 1.0–2.5)
ALT: 22 U/L (ref 6–29)
AST: 20 U/L (ref 10–35)
Albumin: 4.4 g/dL (ref 3.6–5.1)
Alkaline phosphatase (APISO): 71 U/L (ref 31–125)
BUN: 23 mg/dL (ref 7–25)
CALCIUM: 9.8 mg/dL (ref 8.6–10.2)
CO2: 27 mmol/L (ref 20–32)
Chloride: 105 mmol/L (ref 98–110)
Creat: 0.92 mg/dL (ref 0.50–1.10)
GFR, Est African American: 87 mL/min/{1.73_m2} (ref >=60)
GFR, Est Non African American: 75 mL/min/{1.73_m2} (ref >=60)
GLOBULIN: 3 g/dL (ref 1.9–3.7)
Glucose, Bld: 82 mg/dL (ref 65–99)
Potassium: 3.5 mmol/L (ref 3.5–5.3)
Sodium: 141 mmol/L (ref 135–146)
Total Bilirubin: 0.6 mg/dL (ref 0.2–1.2)
Total Protein: 7.4 g/dL (ref 6.1–8.1)

## 2018-12-18 LAB — CYTOLOGY - PAP
DIAGNOSIS: NEGATIVE
HPV: DETECTED — AB

## 2018-12-20 ENCOUNTER — Other Ambulatory Visit: Payer: Self-pay | Admitting: Family Medicine

## 2018-12-20 MED ORDER — VITAMIN D (ERGOCALCIFEROL) 1.25 MG (50000 UNIT) PO CAPS: 50000.0000 [IU] | ORAL_CAPSULE | ORAL | 0 refills | Status: DC

## 2019-03-10 DIAGNOSIS — M25561 Pain in right knee: Secondary | ICD-10-CM | POA: Diagnosis not present

## 2019-03-10 DIAGNOSIS — M1712 Unilateral primary osteoarthritis, left knee: Secondary | ICD-10-CM | POA: Diagnosis not present

## 2019-03-10 DIAGNOSIS — M25562 Pain in left knee: Secondary | ICD-10-CM | POA: Diagnosis not present

## 2019-03-10 DIAGNOSIS — M1711 Unilateral primary osteoarthritis, right knee: Secondary | ICD-10-CM | POA: Diagnosis not present

## 2019-03-11 ENCOUNTER — Other Ambulatory Visit: Payer: Self-pay | Admitting: Family Medicine

## 2019-04-14 DIAGNOSIS — Z1231 Encounter for screening mammogram for malignant neoplasm of breast: Secondary | ICD-10-CM | POA: Diagnosis not present

## 2019-04-19 ENCOUNTER — Encounter: Payer: 59 | Admitting: Family Medicine

## 2019-05-14 ENCOUNTER — Other Ambulatory Visit: Payer: Self-pay

## 2019-05-14 ENCOUNTER — Other Ambulatory Visit: Payer: Self-pay | Admitting: Family Medicine

## 2019-05-14 DIAGNOSIS — R6889 Other general symptoms and signs: Secondary | ICD-10-CM | POA: Diagnosis not present

## 2019-05-14 DIAGNOSIS — Z20822 Contact with and (suspected) exposure to covid-19: Secondary | ICD-10-CM

## 2019-05-15 LAB — NOVEL CORONAVIRUS, NAA: SARS-CoV-2, NAA: NOT DETECTED

## 2019-06-06 DIAGNOSIS — R51 Headache: Secondary | ICD-10-CM | POA: Diagnosis not present

## 2019-06-18 ENCOUNTER — Other Ambulatory Visit: Payer: Self-pay

## 2019-06-18 ENCOUNTER — Encounter: Payer: Self-pay | Admitting: Family Medicine

## 2019-06-18 ENCOUNTER — Ambulatory Visit (INDEPENDENT_AMBULATORY_CARE_PROVIDER_SITE_OTHER): Payer: BC Managed Care – PPO | Admitting: Family Medicine

## 2019-06-18 VITALS — BP 136/84 | HR 63 | Temp 97.1°F | Resp 16 | Ht 62.0 in | Wt 254.8 lb

## 2019-06-18 DIAGNOSIS — Z23 Encounter for immunization: Secondary | ICD-10-CM | POA: Diagnosis not present

## 2019-06-18 DIAGNOSIS — I10 Essential (primary) hypertension: Secondary | ICD-10-CM

## 2019-06-18 DIAGNOSIS — E8881 Metabolic syndrome: Secondary | ICD-10-CM

## 2019-06-18 DIAGNOSIS — F339 Major depressive disorder, recurrent, unspecified: Secondary | ICD-10-CM | POA: Diagnosis not present

## 2019-06-18 DIAGNOSIS — E559 Vitamin D deficiency, unspecified: Secondary | ICD-10-CM

## 2019-06-18 DIAGNOSIS — M51379 Other intervertebral disc degeneration, lumbosacral region without mention of lumbar back pain or lower extremity pain: Secondary | ICD-10-CM

## 2019-06-18 DIAGNOSIS — M5137 Other intervertebral disc degeneration, lumbosacral region: Secondary | ICD-10-CM

## 2019-06-18 MED ORDER — VITAMIN D (ERGOCALCIFEROL) 1.25 MG (50000 UNIT) PO CAPS: 50000.0000 [IU] | ORAL_CAPSULE | ORAL | 1 refills | Status: DC

## 2019-06-18 MED ORDER — TRIAMTERENE-HCTZ 37.5-25 MG PO TABS: 1.0000 | ORAL_TABLET | Freq: Every day | ORAL | 1 refills | Status: DC

## 2019-06-18 MED ORDER — AMPHETAMINE-DEXTROAMPHET ER 10 MG PO CP24: 10.0000 mg | ORAL_CAPSULE | ORAL | 0 refills | Status: DC

## 2019-06-18 MED ORDER — SERTRALINE HCL 50 MG PO TABS: 25.0000 mg | ORAL_TABLET | Freq: Every day | ORAL | 0 refills | Status: DC

## 2019-06-18 NOTE — Patient Instructions (Addendum)
Set up a routine even on days off  Start a gratitude journal  Stay exposed to sunlight for most of the day Exercise / going for a walk every morning that you are off Download the podcast the happiness lab by Deneise Lever ( start at season 1 ) - listen to same one for one week Eat at least 3 meals a day Go to bed around the same time every night.
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
## 2019-07-03 ENCOUNTER — Other Ambulatory Visit: Payer: Self-pay | Admitting: Family Medicine

## 2019-07-03 DIAGNOSIS — I10 Essential (primary) hypertension: Secondary | ICD-10-CM

## 2019-07-10 ENCOUNTER — Other Ambulatory Visit: Payer: Self-pay | Admitting: Family Medicine

## 2019-07-10 DIAGNOSIS — F339 Major depressive disorder, recurrent, unspecified: Secondary | ICD-10-CM

## 2019-07-10 NOTE — Telephone Encounter (Signed)
Requested medication (s) are due for refill today:  Requested medication (s) are on the active medication list: yes  Last refill:  06/18/2019  Future visit scheduled: no  Notes to clinic:  Please update original order; pt request 90 day supply    Requested Prescriptions  Pending Prescriptions Disp Refills   sertraline (ZOLOFT) 50 MG tablet [Pharmacy Med Name: SERTRALINE HCL 50 MG TABLET] 90 tablet 1    Sig: TAKE 1/2-1 TABLETS BY MOUTH DAILY. HALF PILL FOR THE FIRST WEEK AFTER THAT ONE DAILY     Psychiatry:  Antidepressants - SSRI Passed - 07/10/2019  9:37 AM      Passed - Valid encounter within last 6 months    Recent Outpatient Visits          3 weeks ago Major depression, recurrent, chronic ([place])   [facility], Drue Stager, MD   6 months ago Major depression, recurrent, chronic [facility])   [facility] Steele Sizer, MD   1 year ago Major depression, recurrent, chronic [facility])   [facility] Steele Sizer, MD   1 year ago Morbid obesity [facility])   [facility] Sizer, MD   1 year ago Well woman exam   [facility] [place], Drue Stager, MD             Passed - Completed PHQ-2 or PHQ-9 in the last 360 days.

## 2019-07-12 NOTE — Telephone Encounter (Signed)
lvm for pt to schedule appt for one day this week per dr Ancil Boozer.

## 2019-07-12 NOTE — Telephone Encounter (Signed)
No response tried 3x today

## 2019-07-12 NOTE — Telephone Encounter (Signed)
Tried contacting pt again and had to leave vm.

## 2019-07-13 ENCOUNTER — Telehealth: Payer: Self-pay | Admitting: Family Medicine

## 2019-07-13 DIAGNOSIS — F339 Major depressive disorder, recurrent, unspecified: Secondary | ICD-10-CM

## 2019-07-13 NOTE — Telephone Encounter (Signed)
Medication Refill - Medication: sertraline (ZOLOFT) 50 MG tablet and amphetamine-dextroamphetamine (ADDERALL XR) 10 MG 24 hr capsule    Preferred Pharmacy (with phone number or street name):  [id number] - [place], [place] - [address] [phone] (Phone) [phone] (Fax)     Agent: Please be advised that RX refills may take up to 3 business days. We ask that you follow-up with your pharmacy.

## 2019-07-13 NOTE — Telephone Encounter (Signed)
Requested medication (s) are due for refill today: yes  Requested medication (s) are on the active medication list: yes  Last refill:  06/18/2019  Future visit scheduled: no  Notes to clinic:  Refill cannot be delegated    Requested Prescriptions  Pending Prescriptions Disp Refills   sertraline (ZOLOFT) 50 MG tablet 30 tablet 0    Sig: Take 0.5-1 tablets (25-50 mg total) by mouth daily. Half pill for the first week after that one daily     Psychiatry:  Antidepressants - SSRI Passed - 07/13/2019 12:12 PM      Passed - Valid encounter within last 6 months    Recent Outpatient Visits          3 weeks ago Major depression, recurrent, chronic ([place])   [facility]izer, [facility]   6 months ago Major depression, recurrent, chronic [facility])   [facility]izer, MD   1 year ago Major depression, recurrent, chronic [facility])   [facility] Sizer, MD   1 year ago Morbid obesity [facility])   [facility] Steele Sizer, MD   1 year ago Well woman exam   [facility], Drue Stager, MD             Passed - Completed PHQ-2 or PHQ-9 in the last 360 days.       amphetamine-dextroamphetamine (ADDERALL XR) 10 MG 24 hr capsule 30 capsule 0    Sig: Take 1 capsule (10 mg total) by mouth every morning.     Not Delegated - Psychiatry:  Stimulants/ADHD Failed - 07/13/2019 12:12 PM      Failed - This refill cannot be delegated      Failed - Urine Drug Screen completed in last 360 days.      Passed - Valid encounter within last 3 months    Recent Outpatient Visits          3 weeks ago Major depression, recurrent, chronic [facility])   [facility]izer, MD   6 months ago Major depression, recurrent, chronic [facility])   [facility] Sizer, MD   1 year ago Major depression, recurrent, chronic [facility])   [facility]  Steele Sizer, MD   1 year ago Morbid obesity [facility])   [facility] Steele Sizer, MD   1 year ago Well woman exam   [facility] Steele Sizer, MD

## 2019-07-14 NOTE — Telephone Encounter (Signed)
lvm for pt to schedule an appt 

## 2019-07-15 ENCOUNTER — Other Ambulatory Visit: Payer: Self-pay | Admitting: Family Medicine

## 2019-07-15 DIAGNOSIS — F339 Major depressive disorder, recurrent, unspecified: Secondary | ICD-10-CM

## 2019-07-19 ENCOUNTER — Encounter: Payer: Self-pay | Admitting: Family Medicine

## 2019-07-19 ENCOUNTER — Other Ambulatory Visit: Payer: Self-pay

## 2019-07-19 ENCOUNTER — Ambulatory Visit (INDEPENDENT_AMBULATORY_CARE_PROVIDER_SITE_OTHER): Payer: BC Managed Care – PPO | Admitting: Family Medicine

## 2019-07-19 DIAGNOSIS — I10 Essential (primary) hypertension: Secondary | ICD-10-CM | POA: Diagnosis not present

## 2019-07-19 DIAGNOSIS — F339 Major depressive disorder, recurrent, unspecified: Secondary | ICD-10-CM | POA: Diagnosis not present

## 2019-07-19 MED ORDER — AMPHETAMINE-DEXTROAMPHET ER 10 MG PO CP24: 10.0000 mg | ORAL_CAPSULE | Freq: Every day | ORAL | 0 refills | Status: DC

## 2019-07-19 MED ORDER — AMPHETAMINE-DEXTROAMPHET ER 10 MG PO CP24: 10.0000 mg | ORAL_CAPSULE | ORAL | 0 refills | Status: DC

## 2019-07-19 MED ORDER — SERTRALINE HCL 50 MG PO TABS: 50.0000 mg | ORAL_TABLET | Freq: Every day | ORAL | 0 refills | Status: DC
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
## 2019-07-20 ENCOUNTER — Other Ambulatory Visit: Payer: Self-pay | Admitting: Family Medicine

## 2019-07-20 DIAGNOSIS — I10 Essential (primary) hypertension: Secondary | ICD-10-CM

## 2019-08-14 ENCOUNTER — Other Ambulatory Visit: Payer: Self-pay | Admitting: Family Medicine

## 2019-08-14 DIAGNOSIS — F339 Major depressive disorder, recurrent, unspecified: Secondary | ICD-10-CM

## 2019-10-19 ENCOUNTER — Ambulatory Visit: Payer: BC Managed Care – PPO | Admitting: Family Medicine

## 2019-10-22 ENCOUNTER — Other Ambulatory Visit: Payer: Self-pay | Admitting: Family Medicine

## 2019-10-22 DIAGNOSIS — F339 Major depressive disorder, recurrent, unspecified: Secondary | ICD-10-CM

## 2019-10-25 ENCOUNTER — Ambulatory Visit: Payer: BC Managed Care – PPO | Admitting: Family Medicine

## 2019-12-13 ENCOUNTER — Ambulatory Visit
Admission: EM | Admit: 2019-12-13 | Discharge: 2019-12-13 | Disposition: A | Discharge status: Home / Self Care | Payer: BC Managed Care – PPO | Attending: Family Medicine | Admitting: Family Medicine

## 2019-12-13 ENCOUNTER — Other Ambulatory Visit: Payer: Self-pay

## 2019-12-13 DIAGNOSIS — K2900 Acute gastritis without bleeding: Secondary | ICD-10-CM | POA: Insufficient documentation

## 2019-12-13 HISTORY — DX: Other specified behavioral and emotional disorders with onset usually occurring in childhood and adolescence: F98.8

## 2019-12-13 HISTORY — DX: Vitamin D deficiency, unspecified: E55.9

## 2019-12-13 LAB — URINALYSIS, COMPLETE (UACMP) WITH MICROSCOPIC
Bilirubin Urine: NEGATIVE
Glucose, UA: NEGATIVE mg/dL
Hgb urine dipstick: NEGATIVE
Ketones, ur: NEGATIVE mg/dL
Leukocytes,Ua: NEGATIVE
Nitrite: NEGATIVE
Protein, ur: NEGATIVE mg/dL
Specific Gravity, Urine: 1.025 (ref 1.005–1.030)
pH: 7 (ref 5.0–8.0)

## 2019-12-13 MED ORDER — ALUM & MAG HYDROXIDE-SIMETH 200-200-20 MG/5ML PO SUSP: 30.0000 mL | Freq: Once | ORAL | Status: DC

## 2019-12-13 MED ORDER — HYOSCYAMINE SULFATE 0.125 MG SL SUBL: 0.2500 mg | SUBLINGUAL_TABLET | Freq: Once | SUBLINGUAL | Status: DC

## 2019-12-13 MED ORDER — OMEPRAZOLE 20 MG PO CPDR: 20.0000 mg | DELAYED_RELEASE_CAPSULE | Freq: Every day | ORAL | 0 refills | Status: DC

## 2019-12-13 MED ORDER — ALUM & MAG HYDROXIDE-SIMETH 200-200-20 MG/5ML PO SUSP
30.0000 mL | Freq: Once | ORAL | Status: AC
  Administered 2019-12-13: 15:00:00 30 mL via ORAL

## 2019-12-13 MED ORDER — LIDOCAINE VISCOUS HCL 2 % MT SOLN
15.0000 mL | Freq: Once | OROMUCOSAL | Status: AC
  Administered 2019-12-13: 15:00:00 15 mL via ORAL

## 2019-12-13 NOTE — Discharge Instructions (Addendum)
Get digestive enzymes and take it before meals to help you digest your food better, or eat papaya with your meals. Follow up with your family Dr in 2 weeks.  Avoid taking your vitamin D with the Prilosec, take the vitamin D in your med list with dinner.

## 2019-12-13 NOTE — ED Triage Notes (Signed)
Pt reports a nagging left sided abdominal discomfort that woke her today at 0400.  Too Ibuprofen 800 and a BC powder today with no change.  Was able to eat breakfast this morning.  Denies fever, nausea. Not TTP.  States her urine had an odor two days ago.  Denies frequency, urgency, burning.

## 2019-12-13 NOTE — ED Provider Notes (Signed)
[facility]    CSN: [id number] Arrival date & time: 12/13/19  1408      History   Chief Complaint Chief Complaint  Patient presents with  . Abdominal Pain    HPI Cheyenne Walker is a 49 y.o. female. who woke up with constant pain on LLQ of 5/10 sine this am. She went to work, but this area is still bothering her. Has taken Ibuprofen 800 mg at 4 am, and at 10 am took [facility] powder, and no change in the pain. Denies N/V/D/C. Eating made it feel a little better, but did not resolve it. Two days ago noticed a vaginal odor when she voided, but denies abnormal discharge. This odor is no longer present. She denies UTI symptoms. No hx of ovarian cyst or fibroids. Has been having more GERD lateley.     Past Medical History:  Diagnosis Date  . ADD (attention deficit disorder)   . Hypertension   . Vitamin D deficiency     Patient Active Problem List   Diagnosis Date Noted  . Lumbar radiculopathy (L5) 10/08/2018  . Morbid obesity ([place]) 10/08/2018  . Chronic pain syndrome 10/08/2018  . DDD (degenerative disc disease), lumbosacral 05/01/2017  . Anxiety 04/03/2015  . Benign essential HTN 04/03/2015  . Allergic rhinitis 04/03/2015  . Gastro-esophageal reflux disease without esophagitis 04/03/2015  . Major depression, recurrent, chronic ([place]) 04/03/2015  . Morbid obesity with BMI of 40.0-44.9, adult ([place]) 04/03/2015  . Dysmetabolic syndrome [id number]  . Numerous moles 04/03/2015  . Plantar fasciitis 04/03/2015  . PTSD (post-traumatic stress disorder) 04/03/2015  . Vitamin D deficiency 04/03/2015  . Umbilical hernia without obstruction and without gangrene 04/03/2015    Past Surgical History:  Procedure Laterality Date  . CHOLECYSTECTOMY  2006  . HERNIA REPAIR  10/2014    OB History    Gravida  5   Para  4   Term  4   Preterm  0   AB  1   Living  4     SAB      TAB      Ectopic      Multiple      Live Births               Home Medications      Prior to Admission medications   Medication Sig Start Date End Date Taking? Authorizing Provider  amphetamine-dextroamphetamine (ADDERALL XR) 10 MG 24 hr capsule Take 1 capsule (10 mg total) by mouth daily. 07/19/19  Yes [name], Drue Stager, MD  cholecalciferol (VITAMIN D3) 25 MCG (1000 UT) tablet Take 1,000 Units by mouth daily.   Yes [provider]  diclofenac (VOLTAREN) 75 MG EC tablet Take 75 mg by mouth 2 (two) times daily. 05/20/19  Yes [provider]  sertraline (ZOLOFT) 50 MG tablet TAKE 1 TABLET (50 MG TOTAL) BY MOUTH DAILY. HALF PILL FOR THE FIRST WEEK AFTER THAT ONE DAILY 10/22/19  Yes [name], Drue Stager, MD  triamterene-hydrochlorothiazide (MAXZIDE-25) 37.5-25 MG tablet TAKE 1 TABLET BY MOUTH EVERY DAY 07/20/19  Yes [name], Drue Stager, MD  amphetamine-dextroamphetamine (ADDERALL XR) 10 MG 24 hr capsule Take 1 capsule (10 mg total) by mouth every morning. 07/19/19   Steele Sizer, MD  amphetamine-dextroamphetamine (ADDERALL XR) 10 MG 24 hr capsule Take 1 capsule (10 mg total) by mouth daily. 07/19/19   Steele Sizer, MD  Vitamin D, Ergocalciferol, (DRISDOL) 1.25 MG (50000 UT) CAPS capsule Take 1 capsule (50,000 Units total) by mouth every 7 (seven) days.  06/18/19   Steele Sizer, MD    Family History Family History  Problem Relation Age of Onset  . Hypertension Mother   . Hypertension Sister   . Hypertension Brother     Social History Social History   Tobacco Use  . Smoking status: Never Smoker  . Smokeless tobacco: Never Used  Substance Use Topics  . Alcohol use: No    Alcohol/week: 0.0 standard drinks  . Drug use: No     Allergies   Ace inhibitors   Review of Systems Review of Systems  Constitutional: Negative for appetite change, chills, diaphoresis and fever.  Respiratory: Negative for shortness of breath.   Cardiovascular: Negative for chest pain.  Gastrointestinal: Positive for abdominal pain. Negative for abdominal distention, blood in  stool, constipation, diarrhea, nausea and vomiting.  Genitourinary: Negative for decreased urine volume, difficulty urinating, dysuria, frequency, pelvic pain and vaginal discharge.  Musculoskeletal: Negative for gait problem.  Skin: Negative for rash.  Neurological: Negative for weakness.  Hematological: Negative for adenopathy.     Physical Exam Triage Vital Signs ED Triage Vitals  Enc Vitals Group     BP 12/13/19 1437 131/81     Pulse Rate 12/13/19 1437 64     Resp 12/13/19 1437 18     Temp 12/13/19 1437 98.2 F (36.8 C)     Temp Source 12/13/19 1437 Oral     SpO2 12/13/19 1437 98 %     Weight --      Height --      Head Circumference --      Peak Flow --      Pain Score 12/13/19 1433 5     Pain Loc --      Pain Edu? --      Excl. in [place]? --    No data found.  Updated Vital Signs BP 131/81 (BP Location: Left Arm)   Pulse 64   Temp 98.2 F (36.8 C) (Oral)   Resp 18   LMP 09/15/2017   SpO2 98%   Visual Acuity Right Eye Distance:   Left Eye Distance:   Bilateral Distance:    Right Eye Near:   Left Eye Near:    Bilateral Near:     Physical Exam Vitals reviewed.  Constitutional:      Appearance: She is obese.  HENT:     Head: Atraumatic.  Eyes:     General: No scleral icterus.    Extraocular Movements: Extraocular movements intact.  Pulmonary:     Effort: Pulmonary effort is normal.  Abdominal:     General: Abdomen is protuberant. Bowel sounds are normal. There is no abdominal bruit.     Palpations: Abdomen is soft. There is no hepatomegaly, splenomegaly or pulsatile mass.     Tenderness: There is abdominal tenderness in the epigastric area and left lower quadrant.     Hernia: No hernia is present.     Comments: Abdomen was re-examined after GI cocktal and there no longer was any areas of tenderness.   Neurological:     Mental Status: She is alert and oriented to person, place, and time.  Psychiatric:        Mood and Affect: Mood normal.         Behavior: Behavior normal.      UC Treatments / Results  Labs (all labs ordered are listed, but only abnormal results are displayed) Labs Reviewed  URINALYSIS, COMPLETE (UACMP) WITH MICROSCOPIC    EKG   Radiology No results  found.  Procedures Procedures (including critical care time)  Medications Ordered in UC Medications - No data to display  Initial Impression / Assessment and Plan / UC Course  I have reviewed the triage vital signs and the nursing notes. Pertinent labs  results that were available during my care of the patient were reviewed by me and considered in my medical decision making (see chart for details). She was given a GI cocktail dose and 30 minutes later I checked on her, and her abdominal pain was resolved. I placed her on Prilosec 20 mg qd x 4 weeks Advised to FU with PCP in 2 weeks See instructions.   Final Clinical Impressions(s) / UC Diagnoses   Final diagnoses:  None   Discharge Instructions   None    ED Prescriptions    None     PDMP not reviewed this encounter.   Shelby Mattocks, [place] 12/13/19 1608

## 2020-02-04 ENCOUNTER — Other Ambulatory Visit: Payer: Self-pay | Admitting: Family Medicine

## 2020-02-04 DIAGNOSIS — F339 Major depressive disorder, recurrent, unspecified: Secondary | ICD-10-CM

## 2020-02-04 NOTE — Telephone Encounter (Signed)
Requested Prescriptions  Pending Prescriptions Disp Refills  . sertraline (ZOLOFT) 50 MG tablet [Pharmacy Med Name: SERTRALINE HCL 50 MG TABLET] 30 tablet 0    Sig: TAKE 1 TABLET (50 MG TOTAL) BY MOUTH DAILY. HALF PILL FOR THE FIRST WEEK AFTER THAT ONE DAILY     Psychiatry:  Antidepressants - SSRI Failed - 02/04/2020  1:24 AM      Failed - Valid encounter within last 6 months    Recent Outpatient Visits          6 months ago Morbid obesity [facility])   [facility] Steele Sizer, MD   7 months ago Major depression, recurrent, chronic [facility])   [facility] Steele Sizer, MD   1 year ago Major depression, recurrent, chronic [facility])   [facility] Steele Sizer, MD   1 year ago Major depression, recurrent, chronic [facility])   [facility] Steele Sizer, MD   2 years ago Morbid obesity [facility])   [facility], Drue Stager, MD             Passed - Completed PHQ-2 or PHQ-9 in the last 360 days.      Had valid encounter 6 months ago.  Courtesy refill with a note indication patient needs to schedule an office visit.

## 2020-02-18 ENCOUNTER — Other Ambulatory Visit: Payer: Self-pay | Admitting: Family Medicine

## 2020-02-18 DIAGNOSIS — F339 Major depressive disorder, recurrent, unspecified: Secondary | ICD-10-CM

## 2020-02-18 NOTE — Telephone Encounter (Signed)
Please continue to try.

## 2020-02-18 NOTE — Telephone Encounter (Signed)
Tried contacting pt no answer. Unable to leave vm mailbox full. Will try again later

## 2020-02-18 NOTE — Telephone Encounter (Signed)
Attempted to call patient to schedule OV but no answer. Unable to leave a message on voicemail box.

## 2020-02-22 NOTE — Telephone Encounter (Signed)
Tried contacting pt again. Her phone does not ring. It goes straight to vm. I am not able to leave message cause her mailbox is full.

## 2020-03-01 DIAGNOSIS — M1712 Unilateral primary osteoarthritis, left knee: Secondary | ICD-10-CM | POA: Diagnosis not present

## 2020-03-01 DIAGNOSIS — M7652 Patellar tendinitis, left knee: Secondary | ICD-10-CM | POA: Diagnosis not present

## 2020-04-11 ENCOUNTER — Other Ambulatory Visit: Payer: Self-pay | Admitting: Student in an Organized Health Care Education/Training Program

## 2020-04-20 ENCOUNTER — Other Ambulatory Visit: Payer: Self-pay | Admitting: Family Medicine

## 2020-04-20 NOTE — Telephone Encounter (Signed)
diclofenac (VOLTAREN) 75 MG EC tablet     Patient is requesting refill.    Pharmacy:  [id number] - [place], [place] - [address] Phone:  [phone]  Fax:  [phone]

## 2020-04-20 NOTE — Telephone Encounter (Signed)
Requested medication (s) are due for refill today: no  Requested medication (s) are on the active medication list: yes  Last refill:  05/19/2020  Future visit scheduled: no  Notes to clinic: please review for dose and direction  Last filled by historical provider  Requested Prescriptions  Pending Prescriptions Disp Refills   diclofenac (VOLTAREN) 75 MG EC tablet      Sig: Take 1 tablet (75 mg total) by mouth 2 (two) times daily.      Analgesics:  NSAIDS Failed - 04/20/2020 11:08 AM      Failed - Cr in normal range and within 360 days    Creat  Date Value Ref Range Status  12/16/2018 0.92 0.50 - 1.10 mg/dL Final          Failed - HGB in normal range and within 360 days    Hemoglobin  Date Value Ref Range Status  12/16/2018 13.1 11.7 - 15.5 g/dL Final   HGB  Date Value Ref Range Status  10/28/2014 12.6 12.0 - 16.0 g/dL Final          Passed - Patient is not pregnant      Passed - Valid encounter within last 12 months    Recent Outpatient Visits           9 months ago Morbid obesity [facility])   [facility] Steele Sizer, MD   10 months ago Major depression, recurrent, chronic [facility])   [facility] Steele Sizer, MD   1 year ago Major depression, recurrent, chronic [facility])   [facility] Steele Sizer, MD   2 years ago Major depression, recurrent, chronic [facility])   [facility] Steele Sizer, MD   2 years ago Morbid obesity [facility])   [facility] Steele Sizer, MD

## 2020-04-24 NOTE — Telephone Encounter (Signed)
Pt need an appt in order to get refills however not able to lvm due to mailbox being full

## 2020-07-07 ENCOUNTER — Other Ambulatory Visit: Payer: Self-pay | Admitting: Family Medicine

## 2020-07-07 DIAGNOSIS — I10 Essential (primary) hypertension: Secondary | ICD-10-CM

## 2020-07-07 NOTE — Telephone Encounter (Signed)
Requested medications are due for refill today yes  Requested medications are on the active medication list yes  Last refill 9/21  Last visit 06/2019  Future visit scheduled 06/2020  Notes to clinic Failed protocol of visit within 6 months, does have upcoming visit scheduled.

## 2020-07-17 ENCOUNTER — Other Ambulatory Visit: Payer: Self-pay

## 2020-07-18 ENCOUNTER — Encounter: Payer: Self-pay | Admitting: Family Medicine

## 2020-07-18 ENCOUNTER — Other Ambulatory Visit: Payer: Self-pay

## 2020-07-18 ENCOUNTER — Ambulatory Visit (INDEPENDENT_AMBULATORY_CARE_PROVIDER_SITE_OTHER): Payer: BC Managed Care – PPO | Admitting: Family Medicine

## 2020-07-18 VITALS — BP 116/78 | HR 76 | Temp 98.0°F | Resp 16 | Ht 62.0 in | Wt 241.2 lb

## 2020-07-18 DIAGNOSIS — F339 Major depressive disorder, recurrent, unspecified: Secondary | ICD-10-CM

## 2020-07-18 DIAGNOSIS — E786 Lipoprotein deficiency: Secondary | ICD-10-CM

## 2020-07-18 DIAGNOSIS — E559 Vitamin D deficiency, unspecified: Secondary | ICD-10-CM | POA: Diagnosis not present

## 2020-07-18 DIAGNOSIS — M5137 Other intervertebral disc degeneration, lumbosacral region: Secondary | ICD-10-CM

## 2020-07-18 DIAGNOSIS — I10 Essential (primary) hypertension: Secondary | ICD-10-CM | POA: Diagnosis not present

## 2020-07-18 DIAGNOSIS — E8881 Metabolic syndrome: Secondary | ICD-10-CM

## 2020-07-18 LAB — CMP12+LP+TP+TSH+6AC+CBC/D/PLT
ALT: 22 IU/L (ref 0–32)
AST: 21 IU/L (ref 0–40)
Albumin/Globulin Ratio: 1.9 (ref 1.2–2.2)
Albumin: 4.4 g/dL (ref 3.8–4.8)
Alkaline Phosphatase: 77 IU/L (ref 44–121)
BUN/Creatinine Ratio: 20 (ref 9–23)
BUN: 12 mg/dL (ref 6–24)
Basophils Absolute: 0.1 10*3/uL (ref 0.0–0.2)
Basos: 1 %
Bilirubin Total: 0.2 mg/dL (ref 0.0–1.2)
Calcium: 9.5 mg/dL (ref 8.7–10.2)
Chloride: 102 mmol/L (ref 96–106)
Chol/HDL Ratio: 3.3 ratio (ref 0.0–4.4)
Cholesterol, Total: 152 mg/dL (ref 100–199)
Creatinine, Ser: 0.61 mg/dL (ref 0.57–1.00)
EOS (ABSOLUTE): 0.2 10*3/uL (ref 0.0–0.4)
Eos: 3 %
Estimated CHD Risk: <0.5 times avg. (ref 0.0–1.0)
Free Thyroxine Index: 1.4 (ref 1.2–4.9)
GFR calc Af Amer: 125 mL/min/{1.73_m2} (ref >59)
GFR calc non Af Amer: 108 mL/min/{1.73_m2} (ref >59)
GGT: 5 IU/L (ref 0–60)
Globulin, Total: 2.3 g/dL (ref 1.5–4.5)
Glucose: 81 mg/dL (ref 65–99)
HDL: 46 mg/dL (ref >39)
Hematocrit: 42.1 % (ref 34.0–46.6)
Hemoglobin: 13.8 g/dL (ref 11.1–15.9)
Immature Grans (Abs): 0 10*3/uL (ref 0.0–0.1)
Immature Granulocytes: 0 %
Iron: 48 ug/dL (ref 27–159)
LDH: 254 IU/L — ABNORMAL HIGH (ref 119–226)
LDL Chol Calc (NIH): 84 mg/dL (ref 0–99)
Lymphocytes Absolute: 3.2 10*3/uL — ABNORMAL HIGH (ref 0.7–3.1)
Lymphs: 44 %
MCH: 29.7 pg (ref 26.6–33.0)
MCHC: 32.8 g/dL (ref 31.5–35.7)
MCV: 91 fL (ref 79–97)
Monocytes Absolute: 0.4 10*3/uL (ref 0.1–0.9)
Monocytes: 5 %
Neutrophils Absolute: 3.4 10*3/uL (ref 1.4–7.0)
Neutrophils: 47 %
Phosphorus: 4.1 mg/dL (ref 3.0–4.3)
Platelets: 381 10*3/uL (ref 150–450)
Potassium: 4.3 mmol/L (ref 3.5–5.2)
RBC: 4.65 x10E6/uL (ref 3.77–5.28)
RDW: 13.4 % (ref 11.7–15.4)
Sodium: 139 mmol/L (ref 134–144)
T3 Uptake Ratio: 24 % (ref 24–39)
T4, Total: 6 ug/dL (ref 4.5–12.0)
TSH: 0.914 u[IU]/mL (ref 0.450–4.500)
Total Protein: 6.7 g/dL (ref 6.0–8.5)
Triglycerides: 122 mg/dL (ref 0–149)
Uric Acid: 4.8 mg/dL (ref 2.6–6.2)
VLDL Cholesterol Cal: 22 mg/dL (ref 5–40)
WBC: 7.3 10*3/uL (ref 3.4–10.8)

## 2020-07-18 LAB — BASIC METABOLIC PANEL
Chloride: 102 (ref 99–108)
Creatinine: 0.6 (ref 0.5–1.1)
Glucose: 81
Potassium: 4.3 (ref 3.4–5.3)
Sodium: 139 (ref 137–147)

## 2020-07-18 LAB — LIPID PANEL
Cholesterol: 152 (ref 0–200)
HDL: 46 (ref 35–70)
LDL Cholesterol: 84
Triglycerides: 122 (ref 40–160)

## 2020-07-18 LAB — COMPREHENSIVE METABOLIC PANEL
Albumin: 4.4 (ref 3.5–5.0)
Calcium: 9.5 (ref 8.7–10.7)
GFR calc Af Amer: 125
GFR calc non Af Amer: 108
Globulin: 2.3

## 2020-07-18 MED ORDER — DICLOFENAC SODIUM 75 MG PO TBEC: 75.0000 mg | DELAYED_RELEASE_TABLET | Freq: Two times a day (BID) | ORAL | 1 refills | Status: DC | PRN

## 2020-07-18 MED ORDER — TRIAMTERENE-HCTZ 37.5-25 MG PO TABS: 1.0000 | ORAL_TABLET | Freq: Every day | ORAL | 1 refills | Status: DC

## 2020-07-18 MED ORDER — VITAMIN D (ERGOCALCIFEROL) 1.25 MG (50000 UNIT) PO CAPS: 50000.0000 [IU] | ORAL_CAPSULE | ORAL | 1 refills | Status: DC

## 2020-07-18 MED ORDER — PREGABALIN 50 MG PO CAPS: 50.0000 mg | ORAL_CAPSULE | Freq: Every evening | ORAL | 0 refills | Status: DC | PRN

## 2020-07-18 NOTE — Progress Notes (Signed)
Name: Cheyenne Walker   MRN: [id number]    DOB: Jul 25, 1971   Date:07/18/2020       Progress Note  Subjective  Chief Complaint  Medication Refill  HPI   MDD: she came in last year feeling very down, she took zoloft and Adderal and felt better, but she states relationship with her kids improved and she is doing well now, and stopped medication. phq 9 today is normal, explained that based on results she is depressed, but she states she thinks likely because of back pain, does not feel sad.   Morbid obesity: BMI above 40 she lost a few pounds since last visit, she is back eating ice crease and not sure why she lost weight.   Low HDL: discussed eating more fish and tree nuts, needs to increase physical activity   HTN: bp is at goal, she is compliant with medication Triamterene hcz and denies side effects. She denies chest pain, palpitation or sob.   DDD lumbar spine/Chronic pain: she has been taking diclofenac prn, not daily, but it helps with pain and inflammation. She feels stiff and took muscle relaxer in the past but did not help, she tried  Gabapentin  But it was inefective . She has been to the pain clinic and diclofenac is what works the best for her . She states her pain is constant, bothers her when she turns during her sleep. She states more stiff when she first gets up in am's, it improves when moving but at the end of the day it gets worse. She states staying in one position for a long time makes it worse. Pain is usually 8/10, no longer having radiculitis. She is willing to try Lyrica at night, explained risk of daily nsaid's use, may also add Tylenol     Patient Active Problem List   Diagnosis Date Noted  . Lumbar radiculopathy (L5) 10/08/2018  . Morbid obesity ([place]) 10/08/2018  . Chronic pain syndrome 10/08/2018  . DDD (degenerative disc disease), lumbosacral 05/01/2017  . Anxiety 04/03/2015  . Benign essential HTN 04/03/2015  . Allergic rhinitis 04/03/2015  .  Gastro-esophageal reflux disease without esophagitis 04/03/2015  . Major depression, recurrent, chronic ([place]) 04/03/2015  . Morbid obesity with BMI of 40.0-44.9, adult ([place]) 04/03/2015  . Dysmetabolic syndrome 40/81/4481  . Numerous moles 04/03/2015  . Plantar fasciitis 04/03/2015  . PTSD (post-traumatic stress disorder) 04/03/2015  . Vitamin D deficiency 04/03/2015  . Umbilical hernia without obstruction and without gangrene 04/03/2015    Past Surgical History:  Procedure Laterality Date  . CHOLECYSTECTOMY  2006  . HERNIA REPAIR  10/2014    Family History  Problem Relation Age of Onset  . Hypertension Mother   . Hypertension Sister   . Hypertension Brother     Social History   Tobacco Use  . Smoking status: Never Smoker  . Smokeless tobacco: Never Used  Substance Use Topics  . Alcohol use: No    Alcohol/week: 0.0 standard drinks     Current Outpatient Medications:  .  cholecalciferol (VITAMIN D3) 25 MCG (1000 UT) tablet, Take 1,000 Units by mouth daily., Disp: , Rfl:  .  diclofenac (VOLTAREN) 75 MG EC tablet, Take 75 mg by mouth 2 (two) times daily., Disp: , Rfl:  .  phentermine (ADIPEX-P) 37.5 MG tablet, Take 37.5 mg by mouth daily. (Patient not taking: Reported on 07/18/2020), Disp: , Rfl:  .  triamterene-hydrochlorothiazide (MAXZIDE-25) 37.5-25 MG tablet, TAKE 1 TABLET BY MOUTH EVERY DAY (Patient not taking: Reported  on 07/18/2020), Disp: 10 tablet, Rfl: 0 .  Vitamin D, Ergocalciferol, (DRISDOL) 1.25 MG (50000 UT) CAPS capsule, Take 1 capsule (50,000 Units total) by mouth every 7 (seven) days. (Patient not taking: Reported on 07/18/2020), Disp: 12 capsule, Rfl: 1  Allergies  Allergen Reactions  . Ace Inhibitors Other (See Comments)    Cough. Pt states specifically catopril    I personally reviewed active problem list, medication list, allergies, family history, social history, health maintenance with the patient/caregiver today.   ROS  Constitutional: Negative  for fever or weight change.  Respiratory: Negative for cough and shortness of breath.   Cardiovascular: Negative for chest pain or palpitations.  Gastrointestinal: Negative for abdominal pain, no bowel changes.  Musculoskeletal: Positive  for gait problem but no  joint swelling.  Skin: Negative for rash.  Neurological: Negative for dizziness or headache.  No other specific complaints in a complete review of systems (except as listed in HPI above).  Objective  Vitals:   07/18/20 1329  BP: 116/78  Pulse: 76  Resp: 16  Temp: 98 F (36.7 C)  TempSrc: Oral  SpO2: 96%  Weight: 241 lb 3.2 oz (109.4 kg)  Height: 5\' 2"  (1.575 m)    Body mass index is 44.12 kg/m.  Physical Exam  Constitutional: Patient appears well-developed and well-nourished. Obese  No distress.  HEENT: head atraumatic, normocephalic, pupils equal and reactive to light,  neck supple Cardiovascular: Normal rate, regular rhythm and normal heart sounds.  No murmur heard. No BLE edema. Pulmonary/Chest: Effort normal and breath sounds normal. No respiratory distress. Muscular skeletal: tender during palpation of lumbar spine  Abdominal: Soft.  There is no tenderness. Psychiatric: Patient has a normal mood and affect. behavior is normal. Judgment and thought content normal.  PHQ2/9: Depression screen [facility] 2/9 07/18/2020 07/19/2019 06/18/2019 12/16/2018 10/28/2018  Decreased Interest 1 0 2 2 0  Down, Depressed, Hopeless 0 0 2 1 0  PHQ - 2 Score 1 0 4 3 0  Altered sleeping 3 0 2 1 -  Tired, decreased energy 3 0 3 3 -  Change in appetite 3 1 3 3  -  Feeling bad or failure about yourself  0 0 2 1 -  Trouble concentrating 0 0 2 1 -  Moving slowly or fidgety/restless 0 0 2 0 -  Suicidal thoughts 0 0 0 0 -  PHQ-9 Score 10 1 18 12  -  Difficult doing work/chores Somewhat difficult Not difficult at all Very difficult Somewhat difficult -  Some recent data might be hidden    phq 9 is positive   Fall Risk: Fall Risk   07/18/2020 07/19/2019 06/18/2019 12/16/2018 10/28/2018  Falls in the past year? 0 0 - 0 0  Number falls in past yr: 0 0 0 0 -  Injury with Fall? 0 0 0 0 -    Functional Status Survey: Is the patient deaf or have difficulty hearing?: No Does the patient have difficulty seeing, even when wearing glasses/contacts?: No Does the patient have difficulty concentrating, remembering, or making decisions?: No Does the patient have difficulty walking or climbing stairs?: Yes Does the patient have difficulty dressing or bathing?: No Does the patient have difficulty doing errands alone such as visiting a doctor's office or shopping?: No    Assessment & Plan  1. Benign essential HTN  - triamterene-hydrochlorothiazide (MAXZIDE-25) 37.5-25 MG tablet; Take 1 tablet by mouth daily.  Dispense: 90 tablet; Refill: 1  2. Vitamin D deficiency  - Vitamin D, Ergocalciferol, (DRISDOL)  1.25 MG (50000 UNIT) CAPS capsule; Take 1 capsule (50,000 Units total) by mouth every 7 (seven) days.  Dispense: 12 capsule; Refill: 1  3. Metabolic Syndrome    4. Morbid obesity ([place])  Discussed with the patient the risk posed by an increased BMI. Discussed importance of portion control, calorie counting and at least 150 minutes of physical activity weekly. Avoid sweet beverages and drink more water. Eat at least 6 servings of fruit and vegetables daily   5. DDD (degenerative disc disease), lumbosacral  - diclofenac (VOLTAREN) 75 MG EC tablet; Take 1 tablet (75 mg total) by mouth 2 (two) times daily as needed.  Dispense: 90 tablet; Refill: 1 - pregabalin (LYRICA) 50 MG capsule; Take 1-3 capsules (50-150 mg total) by mouth at bedtime as needed.  Dispense: 90 capsule; Refill: 0  6. Low HDL (under 40)   7. Major depression, recurrent, chronic (HCC)  Stable, does not want to take medications

## 2020-07-20 ENCOUNTER — Encounter: Payer: Self-pay | Admitting: Family Medicine

## 2020-07-20 ENCOUNTER — Encounter: Payer: Self-pay | Admitting: Emergency Medicine

## 2020-07-25 ENCOUNTER — Other Ambulatory Visit: Payer: Self-pay

## 2020-07-25 ENCOUNTER — Ambulatory Visit
Admission: EM | Admit: 2020-07-25 | Discharge: 2020-07-25 | Disposition: A | Discharge status: Home / Self Care | Payer: BC Managed Care – PPO | Attending: Family Medicine | Admitting: Family Medicine

## 2020-07-25 ENCOUNTER — Encounter: Payer: Self-pay | Admitting: Emergency Medicine

## 2020-07-25 DIAGNOSIS — R5383 Other fatigue: Secondary | ICD-10-CM | POA: Diagnosis not present

## 2020-07-25 DIAGNOSIS — J111 Influenza due to unidentified influenza virus with other respiratory manifestations: Secondary | ICD-10-CM | POA: Diagnosis not present

## 2020-07-25 DIAGNOSIS — R110 Nausea: Secondary | ICD-10-CM | POA: Insufficient documentation

## 2020-07-25 DIAGNOSIS — K2100 Gastro-esophageal reflux disease with esophagitis, without bleeding: Secondary | ICD-10-CM | POA: Insufficient documentation

## 2020-07-25 DIAGNOSIS — R52 Pain, unspecified: Secondary | ICD-10-CM | POA: Insufficient documentation

## 2020-07-25 DIAGNOSIS — R6883 Chills (without fever): Secondary | ICD-10-CM | POA: Insufficient documentation

## 2020-07-25 DIAGNOSIS — Z1152 Encounter for screening for COVID-19: Secondary | ICD-10-CM | POA: Insufficient documentation

## 2020-07-25 LAB — RESP PANEL BY RT PCR (RSV, FLU A&B, COVID)
Influenza A by PCR: NEGATIVE
Influenza B by PCR: NEGATIVE
Respiratory Syncytial Virus by PCR: NEGATIVE
SARS Coronavirus 2 by RT PCR: NEGATIVE

## 2020-07-25 MED ORDER — OMEPRAZOLE 20 MG PO CPDR: 20.0000 mg | DELAYED_RELEASE_CAPSULE | Freq: Two times a day (BID) | ORAL | 0 refills | Status: DC

## 2020-07-25 MED ORDER — ONDANSETRON HCL 4 MG PO TABS: 4.0000 mg | ORAL_TABLET | Freq: Four times a day (QID) | ORAL | 0 refills | Status: DC
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
## 2020-07-25 NOTE — ED Triage Notes (Signed)
Patient c/o nausea and body aches that started this morning.

## 2020-07-25 NOTE — Discharge Instructions (Addendum)
I have sent in zofran for you to take one tablet every 6 hours as needed for nausea  I have sent in omeprazole for you to take twice a day   Your COVID/flu test is pending.  You should self quarantine until the test result is back.    Take Tylenol as needed for fever or discomfort.  Rest and keep yourself hydrated.    Go to the emergency department if you develop acute worsening symptoms.

## 2020-07-28 ENCOUNTER — Other Ambulatory Visit: Payer: Self-pay | Admitting: Family Medicine

## 2020-07-28 DIAGNOSIS — M5137 Other intervertebral disc degeneration, lumbosacral region: Secondary | ICD-10-CM

## 2020-07-28 DIAGNOSIS — M51379 Other intervertebral disc degeneration, lumbosacral region without mention of lumbar back pain or lower extremity pain: Secondary | ICD-10-CM

## 2020-07-28 NOTE — Telephone Encounter (Signed)
Requested medication (s) are due for refill today: no  Requested medication (s) are on the active medication list: yes  Last refill: 07/18/2020  Future visit scheduled: no  Notes to clinic:  this refill cannot be delegated    Requested Prescriptions  Pending Prescriptions Disp Refills   pregabalin (LYRICA) 50 MG capsule 90 capsule 0    Sig: Take 1-3 capsules (50-150 mg total) by mouth at bedtime as needed.      Not Delegated - Neurology:  Anticonvulsants - Controlled Failed - 07/28/2020  9:12 AM      Failed - This refill cannot be delegated      Passed - Valid encounter within last 12 months    Recent Outpatient Visits           1 week ago Dysmetabolic syndrome   [facility] Steele Sizer, MD   1 year ago Morbid obesity [facility])   [facility] Steele Sizer, MD   1 year ago Major depression, recurrent, chronic [facility])   [facility] Steele Sizer, MD   1 year ago Major depression, recurrent, chronic [facility])   [facility] Steele Sizer, MD   2 years ago Major depression, recurrent, chronic [facility])   [facility] Steele Sizer, MD

## 2020-07-28 NOTE — Telephone Encounter (Signed)
PT need a refill  pregabalin (LYRICA) 50 MG capsule [[id number]]  [id number] - [place], [place] - [address]  [address] [place] [address]  Phone: [phone] Fax: [phone]

## 2020-07-31 ENCOUNTER — Telehealth: Payer: Self-pay

## 2020-08-30 ENCOUNTER — Other Ambulatory Visit: Payer: Self-pay

## 2020-08-30 ENCOUNTER — Ambulatory Visit
Admission: EM | Admit: 2020-08-30 | Discharge: 2020-08-30 | Disposition: A | Discharge status: Home / Self Care | Payer: BC Managed Care – PPO | Attending: Emergency Medicine | Admitting: Emergency Medicine

## 2020-08-30 DIAGNOSIS — R599 Enlarged lymph nodes, unspecified: Secondary | ICD-10-CM | POA: Diagnosis not present

## 2020-08-30 LAB — CBC WITH DIFFERENTIAL/PLATELET
Abs Immature Granulocytes: 0.01 10*3/uL (ref 0.00–0.07)
Basophils Absolute: 0.1 10*3/uL (ref 0.0–0.1)
Basophils Relative: 1 %
Eosinophils Absolute: 0.3 10*3/uL (ref 0.0–0.5)
Eosinophils Relative: 3 %
HCT: 40.9 % (ref 36.0–46.0)
Hemoglobin: 13.9 g/dL (ref 12.0–15.0)
Immature Granulocytes: 0 %
Lymphocytes Relative: 44 %
Lymphs Abs: 3.3 10*3/uL (ref 0.7–4.0)
MCH: 29.1 pg (ref 26.0–34.0)
MCHC: 34 g/dL (ref 30.0–36.0)
MCV: 85.7 fL (ref 80.0–100.0)
Monocytes Absolute: 0.5 10*3/uL (ref 0.1–1.0)
Monocytes Relative: 6 %
Neutro Abs: 3.4 10*3/uL (ref 1.7–7.7)
Neutrophils Relative %: 46 %
Platelets: 351 10*3/uL (ref 150–400)
RBC: 4.77 MIL/uL (ref 3.87–5.11)
RDW: 14.7 % (ref 11.5–15.5)
WBC: 7.5 10*3/uL (ref 4.0–10.5)
nRBC: 0 % (ref 0.0–0.2)

## 2020-08-30 LAB — URINALYSIS, COMPLETE (UACMP) WITH MICROSCOPIC
Bacteria, UA: NONE SEEN
Bilirubin Urine: NEGATIVE
Glucose, UA: NEGATIVE mg/dL
Hgb urine dipstick: NEGATIVE
Ketones, ur: NEGATIVE mg/dL
Leukocytes,Ua: NEGATIVE
Nitrite: NEGATIVE
Protein, ur: NEGATIVE mg/dL
Specific Gravity, Urine: 1.025 (ref 1.005–1.030)
WBC, UA: NONE SEEN WBC/hpf (ref 0–5)
pH: 7 (ref 5.0–8.0)

## 2020-08-30 NOTE — ED Provider Notes (Addendum)
[facility]    CSN: [id number] Arrival date & time: 08/30/20  0177      History   Chief Complaint Chief Complaint  Patient presents with  . Groin Pain    HPI Cheyenne Walker is a 49 y.o. female.   HPI   49 year old female here for evaluation of right groin pain.  Patient reports that her pain came on suddenly yesterday.  She denies any heavy lifting or injury.  She states that the area is not red or swollen.  Patient has had no fever, back pain, nausea, or vomiting.  Patient denies hematuria.  Patient does report that the pain waxes and wanes, she cannot think of anything that makes it worse or better, she took Tylenol which did not help, patient reports that she has developed urinary pressure with urgency and frequency this morning.  Past Medical History:  Diagnosis Date  . ADD (attention deficit disorder)   . Hypertension   . Vitamin D deficiency     Patient Active Problem List   Diagnosis Date Noted  . Lumbar radiculopathy (L5) 10/08/2018  . Morbid obesity ([place]) 10/08/2018  . Chronic pain syndrome 10/08/2018  . DDD (degenerative disc disease), lumbosacral 05/01/2017  . Anxiety 04/03/2015  . Benign essential HTN 04/03/2015  . Allergic rhinitis 04/03/2015  . Gastro-esophageal reflux disease without esophagitis 04/03/2015  . Major depression, recurrent, chronic ([place]) 04/03/2015  . Morbid obesity with BMI of 40.0-44.9, adult ([place]) 04/03/2015  . Dysmetabolic syndrome 93/90/3009  . Numerous moles 04/03/2015  . Plantar fasciitis 04/03/2015  . PTSD (post-traumatic stress disorder) 04/03/2015  . Vitamin D deficiency 04/03/2015  . Umbilical hernia without obstruction and without gangrene 04/03/2015    Past Surgical History:  Procedure Laterality Date  . CHOLECYSTECTOMY  2006  . HERNIA REPAIR  10/2014    OB History    Gravida  5   Para  4   Term  4   Preterm  0   AB  1   Living  4     SAB      TAB      Ectopic      Multiple      Live  Births               Home Medications    Prior to Admission medications   Medication Sig Start Date End Date Taking? Authorizing Provider  cholecalciferol (VITAMIN D3) 25 MCG (1000 UT) tablet Take 1,000 Units by mouth daily.   Yes [provider]  triamterene-hydrochlorothiazide (MAXZIDE-25) 37.5-25 MG tablet Take 1 tablet by mouth daily. 07/18/20  Yes [name], Drue Stager, MD  Vitamin D, Ergocalciferol, (DRISDOL) 1.25 MG (50000 UNIT) CAPS capsule Take 1 capsule (50,000 Units total) by mouth every 7 (seven) days. 07/18/20  Yes [name], Drue Stager, MD  ondansetron (ZOFRAN) 4 MG tablet Take 1 tablet (4 mg total) by mouth every 6 (six) hours. 07/25/20   Faustino Congress, NP  omeprazole (PRILOSEC) 20 MG capsule Take 1 capsule (20 mg total) by mouth 2 (two) times daily before a meal. 07/25/20 08/30/20  Faustino Congress, NP  pregabalin (LYRICA) 50 MG capsule Take 1-3 capsules (50-150 mg total) by mouth at bedtime as needed. 07/18/20 08/30/20  Steele Sizer, MD    Family History Family History  Problem Relation Age of Onset  . Hypertension Mother   . Hypertension Sister   . Hypertension Brother     Social History Social History   Tobacco Use  . Smoking status: Never Smoker  .  Smokeless tobacco: Never Used  Vaping Use  . Vaping Use: Never used  Substance Use Topics  . Alcohol use: No    Alcohol/week: 0.0 standard drinks  . Drug use: No     Allergies   Ace inhibitors   Review of Systems Review of Systems  Constitutional: Negative for activity change, appetite change and fever.  Gastrointestinal: Negative for abdominal pain, diarrhea, nausea and vomiting.  Genitourinary: Positive for frequency and urgency. Negative for dysuria, hematuria, vaginal discharge and vaginal pain.  Musculoskeletal: Negative for back pain.  Skin: Negative for color change.  Hematological: Positive for adenopathy.  Psychiatric/Behavioral: Negative.      Physical Exam Triage Vital  Signs ED Triage Vitals  Enc Vitals Group     BP 08/30/20 0906 140/68     Pulse Rate 08/30/20 0906 61     Resp 08/30/20 0906 18     Temp 08/30/20 0906 97.8 F (36.6 C)     Temp Source 08/30/20 0906 Oral     SpO2 08/30/20 0906 100 %     Weight 08/30/20 0903 239 lb (108.4 kg)     Height 08/30/20 0903 5\' 4"  (1.626 m)     Head Circumference --      Peak Flow --      Pain Score 08/30/20 0902 2     Pain Loc --      Pain Edu? --      Excl. in [place]? --    No data found.  Updated Vital Signs BP 140/68 (BP Location: Left Arm)   Pulse 61   Temp 97.8 F (36.6 C) (Oral)   Resp 18   Ht 5\' 4"  (1.626 m)   Wt 239 lb (108.4 kg)   LMP 09/15/2017   SpO2 100%   BMI 41.02 kg/m   Visual Acuity Right Eye Distance:   Left Eye Distance:   Bilateral Distance:    Right Eye Near:   Left Eye Near:    Bilateral Near:     Physical Exam Vitals and nursing note reviewed.  Constitutional:      General: She is not in acute distress.    Appearance: Normal appearance. She is not ill-appearing.  HENT:     Head: Normocephalic and atraumatic.  Cardiovascular:     Rate and Rhythm: Normal rate and regular rhythm.     Pulses: Normal pulses.     Heart sounds: Normal heart sounds. No murmur heard.  No gallop.   Pulmonary:     Effort: Pulmonary effort is normal.     Breath sounds: Normal breath sounds. No wheezing, rhonchi or rales.  Abdominal:     General: Bowel sounds are normal.     Palpations: Abdomen is soft.     Tenderness: There is no abdominal tenderness. There is no right CVA tenderness, left CVA tenderness or guarding.     Hernia: No hernia is present.  Musculoskeletal:        General: Tenderness present. No swelling or signs of injury. Normal range of motion.  Skin:    General: Skin is warm and dry.     Capillary Refill: Capillary refill takes less than 2 seconds.     Findings: No erythema or rash.  Neurological:     General: No focal deficit present.     Mental Status: She is alert  and oriented to person, place, and time.  Psychiatric:        Mood and Affect: Mood normal.  Behavior: Behavior normal.        Thought Content: Thought content normal.        Judgment: Judgment normal.      UC Treatments / Results  Labs (all labs ordered are listed, but only abnormal results are displayed) Labs Reviewed  CBC WITH DIFFERENTIAL/PLATELET  URINALYSIS, COMPLETE (UACMP) WITH MICROSCOPIC    EKG   Radiology No results found.  Procedures Procedures (including critical care time)  Medications Ordered in UC Medications - No data to display  Initial Impression / Assessment and Plan / UC Course  I have reviewed the triage vital signs and the nursing notes.  Pertinent labs & imaging results that were available during my care of the patient were reviewed by me and considered in my medical decision making (see chart for details).   She is here for evaluation of sudden onset of right groin pain that started yesterday.  She is taken Tylenol at home which has not helped her pain.  She denies fever.  She states that the pain waxes and wanes and she cannot think of anything that precipitates it or relieves it.  Patient denies redness or swelling to the area.  Patient does report that she developed bladder pressure with urgency and frequency this morning but no dysuria or hematuria.  Physical exam reveals tender shotty lymphadenopathy in the right groin.  None in the left groin.  Will send UA and collect CBC.  CBC is unremarkable.   Urinalysis is negative for leukocytes, nitrates, or bacteria.  Suspect patient's symptoms are coming from reactive lymphadenopathy.  Will discharge home and have patient take 6 mg ibuprofen every 6 hours with food and wear loose clothing to see if her symptoms improve.  Will instruct patient to return if she develops fever, worsening pain, swelling, or redness.   Final Clinical Impressions(s) / UC Diagnoses   Final diagnoses:  Reactive  lymphadenopathy     Discharge Instructions     Take over-the-counter ibuprofen, 600 mg every 6 hours with food, for the swollen painful lymph node.  Monitor for any new symptoms.  Return, see your primary care provider, or go to the ER if you develop any increase swelling, pain, redness, or fever.    ED Prescriptions    None     PDMP not reviewed this encounter.   Margarette Canada, NP 08/30/20 1030    Margarette Canada, NP 08/30/20 1031

## 2020-08-30 NOTE — Discharge Instructions (Addendum)
Take over-the-counter ibuprofen, 600 mg every 6 hours with food, for the swollen painful lymph node.  Monitor for any new symptoms.  Return, see your primary care provider, or go to the ER if you develop any increase swelling, pain, redness, or fever.

## 2020-08-30 NOTE — ED Triage Notes (Signed)
Patient complains of right sided groin pain that started yesterday. Reports that the area is tender and she has taken tylenol with no relief. Denies any heavy lifting or exercise.

## 2020-09-01 NOTE — Telephone Encounter (Signed)
completed

## 2020-11-17 ENCOUNTER — Ambulatory Visit
Admission: EM | Admit: 2020-11-17 | Discharge: 2020-11-17 | Disposition: A | Discharge status: Home / Self Care | Payer: BC Managed Care – PPO | Attending: Family Medicine | Admitting: Family Medicine

## 2020-11-17 ENCOUNTER — Other Ambulatory Visit: Payer: Self-pay

## 2020-11-17 DIAGNOSIS — R059 Cough, unspecified: Secondary | ICD-10-CM | POA: Diagnosis not present

## 2020-11-17 DIAGNOSIS — Z20822 Contact with and (suspected) exposure to covid-19: Secondary | ICD-10-CM | POA: Insufficient documentation

## 2020-11-17 DIAGNOSIS — Z79899 Other long term (current) drug therapy: Secondary | ICD-10-CM | POA: Diagnosis not present

## 2020-11-17 DIAGNOSIS — J988 Other specified respiratory disorders: Secondary | ICD-10-CM | POA: Insufficient documentation

## 2020-11-17 LAB — SARS CORONAVIRUS 2 (TAT 6-24 HRS): SARS Coronavirus 2: NEGATIVE

## 2020-11-17 MED ORDER — BENZONATATE 200 MG PO CAPS: 200.0000 mg | ORAL_CAPSULE | Freq: Three times a day (TID) | ORAL | 0 refills | Status: DC | PRN

## 2020-11-17 MED ORDER — IPRATROPIUM BROMIDE 0.06 % NA SOLN: 2.0000 | Freq: Four times a day (QID) | NASAL | 0 refills | Status: DC | PRN

## 2020-11-17 NOTE — ED Provider Notes (Signed)
[facility]    CSN: [id number] Arrival date & time: 11/17/20  1219      History   Chief Complaint Cough, runny nose  HPI  50 year old female presents with the above complaints.  Symptoms started Tuesday. She reports runny nose and cough. No fever. She has taken several OTC meds without relief. No sick contacts.  Patient notes some associated discomfort from all the coughing.  No other associated symptoms.  No other complaints.  Past Medical History:  Diagnosis Date  . ADD (attention deficit disorder)   . Hypertension   . Vitamin D deficiency    Patient Active Problem List   Diagnosis Date Noted  . Lumbar radiculopathy (L5) 10/08/2018  . Morbid obesity ([place]) 10/08/2018  . Chronic pain syndrome 10/08/2018  . DDD (degenerative disc disease), lumbosacral 05/01/2017  . Anxiety 04/03/2015  . Benign essential HTN 04/03/2015  . Allergic rhinitis 04/03/2015  . Gastro-esophageal reflux disease without esophagitis 04/03/2015  . Major depression, recurrent, chronic ([place]) 04/03/2015  . Morbid obesity with BMI of 40.0-44.9, adult ([place]) 04/03/2015  . Dysmetabolic syndrome 01/75/1025  . Numerous moles 04/03/2015  . Plantar fasciitis 04/03/2015  . PTSD (post-traumatic stress disorder) 04/03/2015  . Vitamin D deficiency 04/03/2015  . Umbilical hernia without obstruction and without gangrene 04/03/2015    Past Surgical History:  Procedure Laterality Date  . CHOLECYSTECTOMY  2006  . HERNIA REPAIR  10/2014    OB History    Gravida  5   Para  4   Term  4   Preterm  0   AB  1   Living  4     SAB      IAB      Ectopic      Multiple      Live Births               Home Medications    Prior to Admission medications   Medication Sig Start Date End Date Taking? Authorizing Provider  benzonatate (TESSALON) 200 MG capsule Take 1 capsule (200 mg total) by mouth 3 (three) times daily as needed for cough. 11/17/20  Yes Destinae Neubecker G, DO  ipratropium  (ATROVENT) 0.06 % nasal spray Place 2 sprays into both nostrils 4 (four) times daily as needed for rhinitis. 11/17/20  Yes Aleksandra Raben G, DO  triamterene-hydrochlorothiazide (MAXZIDE-25) 37.5-25 MG tablet Take 1 tablet by mouth daily. 07/18/20  Yes [name], Drue Stager, MD  omeprazole (PRILOSEC) 20 MG capsule Take 1 capsule (20 mg total) by mouth 2 (two) times daily before a meal. 07/25/20 08/30/20  Faustino Congress, NP  pregabalin (LYRICA) 50 MG capsule Take 1-3 capsules (50-150 mg total) by mouth at bedtime as needed. 07/18/20 08/30/20  Steele Sizer, MD    Family History Family History  Problem Relation Age of Onset  . Hypertension Mother   . Hypertension Sister   . Hypertension Brother     Social History Social History   Tobacco Use  . Smoking status: Never Smoker  . Smokeless tobacco: Never Used  Vaping Use  . Vaping Use: Never used  Substance Use Topics  . Alcohol use: No    Alcohol/week: 0.0 standard drinks  . Drug use: No     Allergies   Ace inhibitors   Review of Systems Review of Systems  Constitutional: Negative for fever.  HENT: Positive for rhinorrhea.   Respiratory: Positive for cough.    Physical Exam Triage Vital Signs ED Triage Vitals  Enc Vitals Group  BP 11/17/20 1240 (!) 145/70     Pulse Rate 11/17/20 1240 68     Resp 11/17/20 1240 18     Temp 11/17/20 1240 98.4 F (36.9 C)     Temp Source 11/17/20 1240 Oral     SpO2 11/17/20 1240 99 %     Weight 11/17/20 1238 220 lb (99.8 kg)     Height 11/17/20 1238 5\' 4"  (1.626 m)     Head Circumference --      Peak Flow --      Pain Score 11/17/20 1238 0     Pain Loc --      Pain Edu? --      Excl. in [place]? --    Updated Vital Signs BP (!) 145/70 (BP Location: Left Arm)   Pulse 68   Temp 98.4 F (36.9 C) (Oral)   Resp 18   Ht 5\' 4"  (1.626 m)   Wt 99.8 kg   LMP 09/15/2017   SpO2 99%   BMI 37.76 kg/m   Visual Acuity Right Eye Distance:   Left Eye Distance:   Bilateral Distance:    Right  Eye Near:   Left Eye Near:    Bilateral Near:     Physical Exam Vitals and nursing note reviewed.  Constitutional:      General: She is not in acute distress.    Appearance: Normal appearance. She is not ill-appearing.  HENT:     Head: Normocephalic and atraumatic.     Nose: Rhinorrhea present.  Eyes:     General:        Right eye: No discharge.        Left eye: No discharge.     Conjunctiva/sclera: Conjunctivae normal.  Cardiovascular:     Rate and Rhythm: Normal rate and regular rhythm.  Pulmonary:     Effort: Pulmonary effort is normal.     Breath sounds: Normal breath sounds. No wheezing, rhonchi or rales.  Neurological:     Mental Status: She is alert.  Psychiatric:        Mood and Affect: Mood normal.        Behavior: Behavior normal.    UC Treatments / Results  Labs (all labs ordered are listed, but only abnormal results are displayed) Labs Reviewed  SARS CORONAVIRUS 2 (TAT 6-24 HRS)    EKG   Radiology No results found.  Procedures Procedures (including critical care time)  Medications Ordered in UC Medications - No data to display  Initial Impression / Assessment and Plan / UC Course  I have reviewed the triage vital signs and the nursing notes.  Pertinent labs & imaging results that were available during my care of the patient were reviewed by me and considered in my medical decision making (see chart for details).    50 year old female presents with a respiratory infection.  Exam unremarkable.  Treating with Atrovent nasal spray & Tessalon Perles.  Final Clinical Impressions(s) / UC Diagnoses   Final diagnoses:  Respiratory infection     Discharge Instructions     Lots of fluids.  Medication as prescribed.  Take care  Dr. Lacinda Axon    ED Prescriptions    Medication Sig Dispense Auth. Provider   ipratropium (ATROVENT) 0.06 % nasal spray Place 2 sprays into both nostrils 4 (four) times daily as needed for rhinitis. 15 mL Mekel Haverstock G, DO    benzonatate (TESSALON) 200 MG capsule Take 1 capsule (200 mg total) by mouth 3 (three)  times daily as needed for cough. 30 capsule Coral Spikes, DO     PDMP not reviewed this encounter.   Coral Spikes, [place] 11/17/20 1345

## 2020-11-17 NOTE — ED Triage Notes (Signed)
Pt c/o cough since this past Tuesday. Pt states she has tried multiple OTC meds with no improvement. Pt reports abdominal muscles have begun to ache from the coughing. Pt denies f/n/v/d or other symptoms. Pt denies any known sick contacts.

## 2020-11-17 NOTE — Discharge Instructions (Signed)
Lots of fluids.  Medication as prescribed.  Take care  Dr. Shivon Hackel  

## 2020-12-01 ENCOUNTER — Other Ambulatory Visit: Payer: Self-pay | Admitting: Family Medicine

## 2021-01-28 ENCOUNTER — Other Ambulatory Visit: Payer: Self-pay | Admitting: Family Medicine

## 2021-01-28 DIAGNOSIS — I10 Essential (primary) hypertension: Secondary | ICD-10-CM

## 2021-01-28 NOTE — Telephone Encounter (Signed)
Requested Prescriptions  Pending Prescriptions Disp Refills  . triamterene-hydrochlorothiazide (MAXZIDE-25) 37.5-25 MG tablet [Pharmacy Med Name: TRIAMTERENE-HCTZ 37.5-25 MG TB] 30 tablet 0    Sig: TAKE 1 TABLET BY MOUTH EVERY DAY     Cardiovascular: Diuretic Combos Failed - 01/28/2021 12:51 AM      Failed - Last BP in normal range    BP Readings from Last 1 Encounters:  11/17/20 (!) 145/70         Failed - Valid encounter within last 6 months    Recent Outpatient Visits          6 months ago Dysmetabolic syndrome   [facility] Steele Sizer, MD   1 year ago [facility])   [facility] Steele Sizer, MD   1 year ago Major depression, recurrent, chronic [facility])   [facility] Steele Sizer, MD   2 years ago Major depression, recurrent, chronic [facility])   [facility] Steele Sizer, MD   2 years ago Major depression, recurrent, chronic [facility])   [facility] [place], Drue Stager, MD             Passed - K in normal range and within 360 days    Potassium  Date Value Ref Range Status  07/18/2020 4.3 3.4 - 5.3 Final  10/28/2014 3.2 (L) 3.5 - 5.1 mmol/L Final         Passed - Na in normal range and within 360 days    Sodium  Date Value Ref Range Status  07/18/2020 139 137 - 147 Final  10/28/2014 141 136 - 145 mmol/L Final         Passed - Cr in normal range and within 360 days    Creatinine  Date Value Ref Range Status  07/18/2020 0.6 0.5 - 1.1 Final   Creat  Date Value Ref Range Status  12/16/2018 0.92 0.50 - 1.10 mg/dL Final   Creatinine, Ser  Date Value Ref Range Status  07/17/2020 0.61 0.57 - 1.00 mg/dL Final         Passed - Ca in normal range and within 360 days    Calcium  Date Value Ref Range Status  07/18/2020 9.5 8.7 - 10.7 Final   Calcium, Total  Date Value Ref Range Status  10/28/2014 8.8 8.5 - 10.1 mg/dL Final

## 2021-02-11 ENCOUNTER — Other Ambulatory Visit: Payer: Self-pay | Admitting: Family Medicine

## 2021-02-11 DIAGNOSIS — I10 Essential (primary) hypertension: Secondary | ICD-10-CM

## 2021-02-11 NOTE — Telephone Encounter (Signed)
Requested medication (s) are due for refill today: yes  Requested medication (s) are on the active medication list: yes  Last refill:  01/28/21  Future visit scheduled: no  Notes to clinic:  called pt and LM on VM to call office to make appointment   Requested Prescriptions  Pending Prescriptions Disp Refills   triamterene-hydrochlorothiazide (MAXZIDE-25) 37.5-25 MG tablet [Pharmacy Med Name: TRIAMTERENE-HCTZ 37.5-25 MG TB] 90 tablet 1    Sig: TAKE 1 TABLET BY MOUTH EVERY DAY      Cardiovascular: Diuretic Combos Failed - 02/11/2021 12:47 PM      Failed - Last BP in normal range    BP Readings from Last 1 Encounters:  11/17/20 (!) 145/70          Failed - Valid encounter within last 6 months    Recent Outpatient Visits           6 months ago Dysmetabolic syndrome   [facility] [place], Drue Stager, MD   1 year ago Morbid obesity [facility])   [facility] Steele Sizer, MD   1 year ago Major depression, recurrent, chronic [facility])   [facility] Steele Sizer, MD   2 years ago Major depression, recurrent, chronic [facility])   [facility] Steele Sizer, MD   3 years ago Major depression, recurrent, chronic [facility])   [facility] [place], Drue Stager, MD                Passed - K in normal range and within 360 days    Potassium  Date Value Ref Range Status  07/18/2020 4.3 3.4 - 5.3 Final  10/28/2014 3.2 (L) 3.5 - 5.1 mmol/L Final          Passed - Na in normal range and within 360 days    Sodium  Date Value Ref Range Status  07/18/2020 139 137 - 147 Final  10/28/2014 141 136 - 145 mmol/L Final          Passed - Cr in normal range and within 360 days    Creatinine  Date Value Ref Range Status  07/18/2020 0.6 0.5 - 1.1 Final   Creat  Date Value Ref Range Status  12/16/2018 0.92 0.50 - 1.10 mg/dL Final   Creatinine, Ser  Date Value Ref Range Status  07/17/2020 0.61 0.57 - 1.00  mg/dL Final          Passed - Ca in normal range and within 360 days    Calcium  Date Value Ref Range Status  07/18/2020 9.5 8.7 - 10.7 Final   Calcium, Total  Date Value Ref Range Status  10/28/2014 8.8 8.5 - 10.1 mg/dL Final

## 2021-02-12 ENCOUNTER — Other Ambulatory Visit: Payer: Self-pay

## 2021-02-12 DIAGNOSIS — I10 Essential (primary) hypertension: Secondary | ICD-10-CM

## 2021-02-12 NOTE — Telephone Encounter (Signed)
Last seen 10.26.2021 no future appt sch'd

## 2021-02-28 ENCOUNTER — Ambulatory Visit
Admission: EM | Admit: 2021-02-28 | Discharge: 2021-02-28 | Disposition: A | Discharge status: Home / Self Care | Payer: BC Managed Care – PPO | Attending: Emergency Medicine | Admitting: Emergency Medicine

## 2021-02-28 ENCOUNTER — Other Ambulatory Visit: Payer: Self-pay

## 2021-02-28 DIAGNOSIS — J069 Acute upper respiratory infection, unspecified: Secondary | ICD-10-CM

## 2021-02-28 DIAGNOSIS — H66003 Acute suppurative otitis media without spontaneous rupture of ear drum, bilateral: Secondary | ICD-10-CM

## 2021-02-28 MED ORDER — IPRATROPIUM BROMIDE 0.06 % NA SOLN: 2.0000 | Freq: Four times a day (QID) | NASAL | 0 refills | Status: DC | PRN

## 2021-02-28 MED ORDER — AMOXICILLIN-POT CLAVULANATE 875-125 MG PO TABS: 1.0000 | ORAL_TABLET | Freq: Two times a day (BID) | ORAL | 0 refills | Status: DC

## 2021-02-28 MED ORDER — PROMETHAZINE-DM 6.25-15 MG/5ML PO SYRP: 5.0000 mL | ORAL_SOLUTION | Freq: Four times a day (QID) | ORAL | 0 refills | Status: DC | PRN

## 2021-02-28 MED ORDER — BENZONATATE 200 MG PO CAPS: 200.0000 mg | ORAL_CAPSULE | Freq: Three times a day (TID) | ORAL | 0 refills | Status: DC | PRN

## 2021-02-28 NOTE — ED Triage Notes (Signed)
Pt c/o nasal congestion, cough, sore throat and bilateral ear pain since this past week. Pt denies f/n/v/d, body aches or other symptoms. Pt has taken at-home COVID tests and they were negative.

## 2021-02-28 NOTE — ED Provider Notes (Signed)
[facility]    CSN: [id number] Arrival date & time: 02/28/21  1248      History   Chief Complaint Chief Complaint  Patient presents with  . Cough  . Otalgia    HPI Cheyenne Walker is a 50 y.o. female.   HPI   50 year old female here for evaluation of nasal congestion, sore throat, bilateral ear pain, and cough.  Patient reports that she has had the above symptoms for the past 5 days and developed ear pain 2 days ago that worsened this morning.  She states that her sore throat has been hurting on the left side only and that her cough is productive for clear sputum.  She denies fever, nasal discharge, shortness of breath or wheezing.  Past Medical History:  Diagnosis Date  . ADD (attention deficit disorder)   . Hypertension   . Vitamin D deficiency     Patient Active Problem List   Diagnosis Date Noted  . Lumbar radiculopathy (L5) 10/08/2018  . Morbid obesity ([place]) 10/08/2018  . Chronic pain syndrome 10/08/2018  . DDD (degenerative disc disease), lumbosacral 05/01/2017  . Anxiety 04/03/2015  . Benign essential HTN 04/03/2015  . Allergic rhinitis 04/03/2015  . Gastro-esophageal reflux disease without esophagitis 04/03/2015  . Major depression, recurrent, chronic ([place]) 04/03/2015  . Morbid obesity with BMI of 40.0-44.9, adult ([place]) 04/03/2015  . Dysmetabolic syndrome 93/79/0240  . Numerous moles 04/03/2015  . Plantar fasciitis 04/03/2015  . PTSD (post-traumatic stress disorder) 04/03/2015  . Vitamin D deficiency 04/03/2015  . Umbilical hernia without obstruction and without gangrene 04/03/2015    Past Surgical History:  Procedure Laterality Date  . CHOLECYSTECTOMY  2006  . HERNIA REPAIR  10/2014    OB History    Gravida  5   Para  4   Term  4   Preterm  0   AB  1   Living  4     SAB      IAB      Ectopic      Multiple      Live Births               Home Medications    Prior to Admission medications   Medication Sig  Start Date End Date Taking? Authorizing Provider  amoxicillin-clavulanate (AUGMENTIN) 875-125 MG tablet Take 1 tablet by mouth every 12 (twelve) hours for 10 days. 02/28/21 03/10/21 Yes Margarette Canada, NP  promethazine-dextromethorphan (PROMETHAZINE-DM) 6.25-15 MG/5ML syrup Take 5 mLs by mouth 4 (four) times daily as needed. 02/28/21  Yes Margarette Canada, NP  triamterene-hydrochlorothiazide (MAXZIDE-25) 37.5-25 MG tablet TAKE 1 TABLET BY MOUTH EVERY DAY 01/28/21  Yes [name], Drue Stager, MD  benzonatate (TESSALON) 200 MG capsule Take 1 capsule (200 mg total) by mouth 3 (three) times daily as needed for cough. 02/28/21   Margarette Canada, NP  ipratropium (ATROVENT) 0.06 % nasal spray Place 2 sprays into both nostrils 4 (four) times daily as needed for rhinitis. 02/28/21   Margarette Canada, NP  omeprazole (PRILOSEC) 20 MG capsule Take 1 capsule (20 mg total) by mouth 2 (two) times daily before a meal. 07/25/20 08/30/20  Faustino Congress, NP  pregabalin (LYRICA) 50 MG capsule Take 1-3 capsules (50-150 mg total) by mouth at bedtime as needed. 07/18/20 08/30/20  Steele Sizer, MD    Family History Family History  Problem Relation Age of Onset  . Hypertension Mother   . Hypertension Sister   . Hypertension Brother     Social History Social  History   Tobacco Use  . Smoking status: Never Smoker  . Smokeless tobacco: Never Used  Vaping Use  . Vaping Use: Never used  Substance Use Topics  . Alcohol use: No    Alcohol/week: 0.0 standard drinks  . Drug use: No     Allergies   Ace inhibitors   Review of Systems Review of Systems  Constitutional: Negative for activity change, appetite change and fever.  HENT: Positive for congestion, ear pain and sore throat. Negative for rhinorrhea.   Respiratory: Positive for cough. Negative for shortness of breath and wheezing.      Physical Exam Triage Vital Signs ED Triage Vitals  Enc Vitals Group     BP 02/28/21 1335 (!) 150/110     Pulse Rate 02/28/21 1335 63      Resp 02/28/21 1335 18     Temp 02/28/21 1335 98.7 F (37.1 C)     Temp Source 02/28/21 1335 Oral     SpO2 02/28/21 1335 100 %     Weight 02/28/21 1334 240 lb (108.9 kg)     Height 02/28/21 1334 5\' 4"  (1.626 m)     Head Circumference --      Peak Flow --      Pain Score 02/28/21 1333 6     Pain Loc --      Pain Edu? --      Excl. in [place]? --    No data found.  Updated Vital Signs BP (!) 150/110 (BP Location: Left Arm)   Pulse 63   Temp 98.7 F (37.1 C) (Oral)   Resp 18   Ht 5\' 4"  (1.626 m)   Wt 240 lb (108.9 kg)   LMP 09/15/2017   SpO2 100%   BMI 41.20 kg/m   Visual Acuity Right Eye Distance:   Left Eye Distance:   Bilateral Distance:    Right Eye Near:   Left Eye Near:    Bilateral Near:     Physical Exam Vitals and nursing note reviewed.  Constitutional:      General: She is not in acute distress.    Appearance: Normal appearance. She is obese. She is not ill-appearing.  HENT:     Head: Normocephalic and atraumatic.     Right Ear: Ear canal and external ear normal. There is no impacted cerumen.     Left Ear: Ear canal and external ear normal. There is no impacted cerumen.     Nose: Congestion and rhinorrhea present.     Mouth/Throat:     Mouth: Mucous membranes are moist.     Pharynx: Oropharynx is clear. Posterior oropharyngeal erythema present.  Cardiovascular:     Rate and Rhythm: Normal rate and regular rhythm.     Pulses: Normal pulses.     Heart sounds: Normal heart sounds. No murmur heard. No gallop.   Pulmonary:     Effort: Pulmonary effort is normal.     Breath sounds: Normal breath sounds. No wheezing, rhonchi or rales.  Musculoskeletal:     Cervical back: Normal range of motion and neck supple.  Lymphadenopathy:     Cervical: Cervical adenopathy present.  Skin:    General: Skin is warm and dry.     Capillary Refill: Capillary refill takes less than 2 seconds.     Findings: No erythema or rash.  Neurological:     General: No focal deficit  present.     Mental Status: She is alert and oriented to person, place, and time.  Psychiatric:        Mood and Affect: Mood normal.        Behavior: Behavior normal.        Thought Content: Thought content normal.        Judgment: Judgment normal.      UC Treatments / Results  Labs (all labs ordered are listed, but only abnormal results are displayed) Labs Reviewed - No data to display  EKG   Radiology No results found.  Procedures Procedures (including critical care time)  Medications Ordered in UC Medications - No data to display  Initial Impression / Assessment and Plan / UC Course  I have reviewed the triage vital signs and the nursing notes.  Pertinent labs & imaging results that were available during my care of the patient were reviewed by me and considered in my medical decision making (see chart for details).   Patient is a very pleasant 50 year old female here for evaluation of respiratory complaints that been present for the past 5 days and worsening over the last 3 as outlined in the HPI above.  Patient's physical exam reveals bilateral erythematous and injected tympanic membranes with a loss of landmarks.  External auditory canals are clear.  Nasal mucosa is erythematous and edematous with thick nasal discharge in both naris.  Oropharyngeal exam reveals posterior oropharyngeal erythema without tonsillar edema, erythema, or exudate.  Patient does have bilateral anterior cervical lymphadenopathy.  Cardiopulmonary exam is benign.  Patient's exam is consistent with a URI and bilateral otitis media.  Will treat with Augmentin for the otitis media, Atrovent nasal spray to help with nasal congestion, Tessalon Perles and Promethazine DM to help with cough and congestion.  Work note provided.   Final Clinical Impressions(s) / UC Diagnoses   Final diagnoses:  Upper respiratory tract infection, unspecified type  Non-recurrent acute suppurative otitis media of both ears  without spontaneous rupture of tympanic membranes     Discharge Instructions     Take the Augmentin twice daily for 10 days with food for treatment of your ear infection.  Take an over-the-counter probiotic 1 hour after each dose of antibiotic to prevent diarrhea.  Use over-the-counter Tylenol and ibuprofen as needed for pain or fever.  Use the Atrovent nasal spray, 2 squirts in each nostril every 6 hours, as needed for runny nose and postnasal drip.  Use the Tessalon Perles every 8 hours during the day.  Take them with a small sip of water.  They may give you some numbness to the base of your tongue or a metallic taste in your mouth, this is normal.  Use the Promethazine DM cough syrup at bedtime for cough and congestion.  It will make you drowsy so do not take it during the day.  Return for reevaluation or see your primary care provider for any new or worsening symptoms.   Place a hot water bottle, or heating pad, underneath your pillowcase at night to help dilate up your ear and aid in pain relief as well as resolution of the infection.  Return for reevaluation for any new or worsening symptoms.     ED Prescriptions    Medication Sig Dispense Auth. Provider   benzonatate (TESSALON) 200 MG capsule Take 1 capsule (200 mg total) by mouth 3 (three) times daily as needed for cough. 30 capsule Margarette Canada, NP   ipratropium (ATROVENT) 0.06 % nasal spray Place 2 sprays into both nostrils 4 (four) times daily as needed for rhinitis. 15 mL Margarette Canada,  NP   amoxicillin-clavulanate (AUGMENTIN) 875-125 MG tablet Take 1 tablet by mouth every 12 (twelve) hours for 10 days. 20 tablet Margarette Canada, NP   promethazine-dextromethorphan (PROMETHAZINE-DM) 6.25-15 MG/5ML syrup Take 5 mLs by mouth 4 (four) times daily as needed. 118 mL Margarette Canada, NP     PDMP not reviewed this encounter.   Margarette Canada, NP 02/28/21 1352

## 2021-02-28 NOTE — Discharge Instructions (Signed)
Take the Augmentin twice daily for 10 days with food for treatment of your ear infection.  Take an over-the-counter probiotic 1 hour after each dose of antibiotic to prevent diarrhea.  Use over-the-counter Tylenol and ibuprofen as needed for pain or fever.  Use the Atrovent nasal spray, 2 squirts in each nostril every 6 hours, as needed for runny nose and postnasal drip.  Use the Tessalon Perles every 8 hours during the day.  Take them with a small sip of water.  They may give you some numbness to the base of your tongue or a metallic taste in your mouth, this is normal.  Use the Promethazine DM cough syrup at bedtime for cough and congestion.  It will make you drowsy so do not take it during the day.  Return for reevaluation or see your primary care provider for any new or worsening symptoms.   Place a hot water bottle, or heating pad, underneath your pillowcase at night to help dilate up your ear and aid in pain relief as well as resolution of the infection.  Return for reevaluation for any new or worsening symptoms.

## 2021-03-10 ENCOUNTER — Other Ambulatory Visit: Payer: Self-pay

## 2021-03-10 ENCOUNTER — Encounter: Payer: Self-pay | Admitting: Emergency Medicine

## 2021-03-10 ENCOUNTER — Ambulatory Visit
Admission: EM | Admit: 2021-03-10 | Discharge: 2021-03-10 | Disposition: A | Discharge status: Home / Self Care | Payer: BC Managed Care – PPO | Attending: Family Medicine | Admitting: Family Medicine

## 2021-03-10 DIAGNOSIS — H6983 Other specified disorders of Eustachian tube, bilateral: Secondary | ICD-10-CM

## 2021-03-10 MED ORDER — CETIRIZINE HCL 10 MG PO TABS: 10.0000 mg | ORAL_TABLET | Freq: Every day | ORAL | 0 refills | Status: DC

## 2021-03-10 MED ORDER — FLUTICASONE PROPIONATE 50 MCG/ACT NA SUSP: 2.0000 | Freq: Every day | NASAL | 0 refills | Status: DC

## 2021-03-10 NOTE — ED Provider Notes (Signed)
[facility]    CSN: [id number] Arrival date & time: 03/10/21  0816  History   Chief Complaint Chief Complaint  Patient presents with   Otalgia   HPI  50 year old female presents with some mild ear pain and some difficulty hearing.  Patient was recently seen on 6/8.  Diagnosed with otitis media and URI.  Treated with Augmentin.  Patient states that she has improved but still has some issues with her ears.  She has intermittent pain in the right ear.  She states that she some tinnitus in her left ear and some difficulty hearing.  No fever.  No current respiratory symptoms.  No relieving factors.  No other complaints.  Past Medical History:  Diagnosis Date   ADD (attention deficit disorder)    Hypertension    Vitamin D deficiency     Patient Active Problem List   Diagnosis Date Noted   Lumbar radiculopathy (L5) 10/08/2018   Morbid obesity ([place]) 10/08/2018   Chronic pain syndrome 10/08/2018   DDD (degenerative disc disease), lumbosacral 05/01/2017   Anxiety 04/03/2015   Benign essential HTN 04/03/2015   Allergic rhinitis 04/03/2015   Gastro-esophageal reflux disease without esophagitis 04/03/2015   Major depression, recurrent, chronic ([place]) 04/03/2015   Morbid obesity with BMI of 40.0-44.9, adult ([place]) 29/51/8841   Dysmetabolic syndrome 66/02/3015   Numerous moles 04/03/2015   Plantar fasciitis 04/03/2015   PTSD (post-traumatic stress disorder) 04/03/2015   Vitamin D deficiency 10/01/3233   Umbilical hernia without obstruction and without gangrene 04/03/2015    Past Surgical History:  Procedure Laterality Date   CHOLECYSTECTOMY  2006   HERNIA REPAIR  10/2014    OB History     Gravida  5   Para  4   Term  4   Preterm  0   AB  1   Living  4      SAB      IAB      Ectopic      Multiple      Live Births               Home Medications    Prior to Admission medications   Medication Sig Start Date End Date Taking? Authorizing  Provider  cetirizine (ZYRTEC ALLERGY) 10 MG tablet Take 1 tablet (10 mg total) by mouth daily. 03/10/21  Yes Mishelle Hassan G, DO  fluticasone (FLONASE) 50 MCG/ACT nasal spray Place 2 sprays into both nostrils daily. 03/10/21  Yes Tyrease Vandeberg G, DO  triamterene-hydrochlorothiazide (MAXZIDE-25) 37.5-25 MG tablet TAKE 1 TABLET BY MOUTH EVERY DAY 01/28/21  Yes [name], Drue Stager, MD  omeprazole (PRILOSEC) 20 MG capsule Take 1 capsule (20 mg total) by mouth 2 (two) times daily before a meal. 07/25/20 08/30/20  Faustino Congress, NP  pregabalin (LYRICA) 50 MG capsule Take 1-3 capsules (50-150 mg total) by mouth at bedtime as needed. 07/18/20 08/30/20  Steele Sizer, MD    Family History Family History  Problem Relation Age of Onset   Hypertension Mother    Hypertension Sister    Hypertension Brother     Social History Social History   Tobacco Use   Smoking status: Never   Smokeless tobacco: Never  Vaping Use   Vaping Use: Never used  Substance Use Topics   Alcohol use: No    Alcohol/week: 0.0 standard drinks   Drug use: No     Allergies   Ace inhibitors   Review of Systems Review of Systems  Constitutional: Negative.  HENT:  Positive for hearing loss and tinnitus.     Physical Exam Triage Vital Signs ED Triage Vitals  Enc Vitals Group     BP 03/10/21 0836 (!) 146/97     Pulse Rate 03/10/21 0836 60     Resp 03/10/21 0836 14     Temp 03/10/21 0836 98.1 F (36.7 C)     Temp Source 03/10/21 0836 Oral     SpO2 03/10/21 0836 96 %     Weight 03/10/21 0833 236 lb (107 kg)     Height 03/10/21 0833 5\' 4"  (1.626 m)     Head Circumference --      Peak Flow --      Pain Score 03/10/21 0833 0     Pain Loc --      Pain Edu? --      Excl. in [place]? --    Updated Vital Signs BP (!) 146/97 (BP Location: Left Arm)   Pulse 60   Temp 98.1 F (36.7 C) (Oral)   Resp 14   Ht 5\' 4"  (1.626 m)   Wt 107 kg   LMP 09/15/2017   SpO2 96%   BMI 40.51 kg/m   Visual Acuity Right Eye  Distance:   Left Eye Distance:   Bilateral Distance:    Right Eye Near:   Left Eye Near:    Bilateral Near:     Physical Exam Vitals and nursing note reviewed.  Constitutional:      General: She is not in acute distress.    Appearance: Normal appearance. She is not ill-appearing.  HENT:     Head: Normocephalic and atraumatic.     Right Ear: Tympanic membrane and ear canal normal.     Left Ear: Tympanic membrane and ear canal normal.  Cardiovascular:     Rate and Rhythm: Normal rate and regular rhythm.  Pulmonary:     Effort: Pulmonary effort is normal.     Breath sounds: Normal breath sounds. No wheezing, rhonchi or rales.  Neurological:     Mental Status: She is alert.  Psychiatric:        Mood and Affect: Mood normal.        Behavior: Behavior normal.    UC Treatments / Results  Labs (all labs ordered are listed, but only abnormal results are displayed) Labs Reviewed - No data to display  EKG   Radiology No results found.  Procedures Procedures (including critical care time)  Medications Ordered in UC Medications - No data to display  Initial Impression / Assessment and Plan / UC Course  I have reviewed the triage vital signs and the nursing notes.  Pertinent labs & imaging results that were available during my care of the patient were reviewed by me and considered in my medical decision making (see chart for details).    50 year old female presents with eustachian tube dysfunction following recent respiratory infection and otitis media.  I advised her to call her primary for referral to ENT.  Treating with Flonase and Zyrtec.  Supportive care.  Final Clinical Impressions(s) / UC Diagnoses   Final diagnoses:  Dysfunction of both eustachian tubes     Discharge Instructions      Use the medications as directed.  Call your primary to discuss referral to ENT.  Take care  Dr. Lacinda Axon   ED Prescriptions     Medication Sig Dispense Auth. Provider    fluticasone (FLONASE) 50 MCG/ACT nasal spray Place 2 sprays into both nostrils daily.  16 g Marquelle Balow G, DO   cetirizine (ZYRTEC ALLERGY) 10 MG tablet Take 1 tablet (10 mg total) by mouth daily. 30 tablet Coral Spikes, DO      PDMP not reviewed this encounter.   Thersa Salt [place], [place] 03/10/21 [phone]

## 2021-03-10 NOTE — ED Triage Notes (Signed)
Patient c/o bilateral ear pain and pressure that has been going on for a week.  Patient denies fevers.

## 2021-03-10 NOTE — Discharge Instructions (Addendum)
Use the medications as directed.  Call your primary to discuss referral to ENT.  Take care  Dr. Lacinda Axon

## 2021-03-27 ENCOUNTER — Telehealth: Payer: Self-pay | Admitting: Family Medicine

## 2021-03-27 DIAGNOSIS — I10 Essential (primary) hypertension: Secondary | ICD-10-CM

## 2021-03-27 NOTE — Telephone Encounter (Signed)
   Notes to clinic:  Courtesy refill given  No appt has been scheduled    Requested Prescriptions  Pending Prescriptions Disp Refills   triamterene-hydrochlorothiazide (MAXZIDE-25) 37.5-25 MG tablet [Pharmacy Med Name: TRIAMTERENE-HCTZ 37.5-25 MG TB] 30 tablet 0    Sig: TAKE 1 TABLET BY MOUTH EVERY DAY      Cardiovascular: Diuretic Combos Failed - 03/27/2021  1:31 PM      Failed - Last BP in normal range    BP Readings from Last 1 Encounters:  03/10/21 (!) 146/97          Failed - Valid encounter within last 6 months    Recent Outpatient Visits           8 months ago Dysmetabolic syndrome   [facility] Steele Sizer, MD   1 year ago Morbid obesity [facility])   [facility] Steele Sizer, MD   1 year ago Major depression, recurrent, chronic [facility])   [facility] Steele Sizer, MD   2 years ago Major depression, recurrent, chronic [facility])   [facility] Steele Sizer, MD   3 years ago Major depression, recurrent, chronic [facility])   [facility] [place], Drue Stager, MD                Passed - K in normal range and within 360 days    Potassium  Date Value Ref Range Status  07/18/2020 4.3 3.4 - 5.3 Final  10/28/2014 3.2 (L) 3.5 - 5.1 mmol/L Final          Passed - Na in normal range and within 360 days    Sodium  Date Value Ref Range Status  07/18/2020 139 137 - 147 Final  10/28/2014 141 136 - 145 mmol/L Final          Passed - Cr in normal range and within 360 days    Creatinine  Date Value Ref Range Status  07/18/2020 0.6 0.5 - 1.1 Final   Creat  Date Value Ref Range Status  12/16/2018 0.92 0.50 - 1.10 mg/dL Final   Creatinine, Ser  Date Value Ref Range Status  07/17/2020 0.61 0.57 - 1.00 mg/dL Final          Passed - Ca in normal range and within 360 days    Calcium  Date Value Ref Range Status  07/18/2020 9.5 8.7 - 10.7 Final   Calcium, Total  Date Value  Ref Range Status  10/28/2014 8.8 8.5 - 10.1 mg/dL Final

## 2021-04-05 ENCOUNTER — Other Ambulatory Visit: Payer: Self-pay | Admitting: Family Medicine

## 2021-04-06 NOTE — Telephone Encounter (Signed)
Lvm informing pt that dr Ancil Boozer is willing to do a one week supply. Pt has to be seen by her or any other provider here in office next week

## 2021-04-06 NOTE — Telephone Encounter (Addendum)
Patient checking on the status of triamterene-hydrochlorothiazide (MAXZIDE-25) 37.5-25 MG tablet, patient scheduled a medication follow up with PCP on 05/07/2021. Patient would like courtesy refill sent in, patient states she has 2 pills left.    [id number] - [place], [place] - [address]  [address], [place] [address]  Phone:  [phone]  Fax:  [phone]

## 2021-04-12 ENCOUNTER — Other Ambulatory Visit: Payer: Self-pay

## 2021-04-12 ENCOUNTER — Ambulatory Visit (INDEPENDENT_AMBULATORY_CARE_PROVIDER_SITE_OTHER): Payer: BC Managed Care – PPO | Admitting: Unknown Physician Specialty

## 2021-04-12 ENCOUNTER — Encounter: Payer: Self-pay | Admitting: Unknown Physician Specialty

## 2021-04-12 VITALS — BP 118/74 | HR 74 | Temp 98.2°F | Resp 16 | Ht 61.0 in | Wt 245.9 lb

## 2021-04-12 DIAGNOSIS — Z23 Encounter for immunization: Secondary | ICD-10-CM | POA: Diagnosis not present

## 2021-04-12 DIAGNOSIS — I10 Essential (primary) hypertension: Secondary | ICD-10-CM

## 2021-04-12 MED ORDER — TRIAMTERENE-HCTZ 37.5-25 MG PO TABS: 1.0000 | ORAL_TABLET | Freq: Every day | ORAL | 1 refills | Status: DC

## 2021-04-12 NOTE — Assessment & Plan Note (Signed)
Stable, continue present medications.   

## 2021-04-12 NOTE — Progress Notes (Signed)
BP 118/74 (BP Location: Left Arm, Patient Position: Sitting)   Pulse 74   Temp 98.2 F (36.8 C) (Oral)   Resp 16   Ht 5\' 1"  (1.549 m)   Wt 245 lb 14.4 oz (111.5 kg)   LMP 09/15/2017   SpO2 97%   BMI 46.46 kg/m    Subjective:    Patient ID: Cheyenne Walker, female    DOB: [birth year], 50 y.o.   MRN: [id number]  HPI: Cheyenne Walker is a 50 y.o. female  Chief Complaint  Patient presents with   Medication Refill   Hypertension Using Maxide 25 mg without difficulty Average home BPs not checking   No problems or lightheadedness No chest pain with exertion or shortness of breath No Edema  The 10-year ASCVD risk score Mikey Bussing DC Jr., et al., 2013) is: 1%*   Values used to calculate the score:     Age: 65 years     Sex: Female     Is Non-Hispanic African American: No     Diabetic: No     Tobacco smoker: No     Systolic Blood Pressure: 009 mmHg     Is BP treated: Yes     HDL Cholesterol: 46 mg/dL*     Total Cholesterol: 152 mg/dL*     * - Cholesterol units were assumed for this score calculation  See PHQ 9 below-  Pt states she is frustrated with her weight and is unable to stop eating.  Denies depression Depression screen [facility] 2/9 04/12/2021 07/18/2020 07/19/2019 06/18/2019 12/16/2018  Decreased Interest 0 1 0 2 2  Down, Depressed, Hopeless 0 0 0 2 1  PHQ - 2 Score 0 1 0 4 3  Altered sleeping 2 3 0 2 1  Tired, decreased energy 3 3 0 3 3  Change in appetite 3 3 1 3 3   Feeling bad or failure about yourself  3 0 0 2 1  Trouble concentrating 0 0 0 2 1  Moving slowly or fidgety/restless 1 0 0 2 0  Suicidal thoughts 0 0 0 0 0  PHQ-9 Score 12 10 1 18 12   Difficult doing work/chores Not difficult at all Somewhat difficult Not difficult at all Very difficult Somewhat difficult  Some recent data might be hidden   Obesity Pt states she has tried diet and exercise and is unable to stick with it.    Relevant past medical, surgical, family and social history reviewed and updated as  indicated. Interim medical history since our last visit reviewed. Allergies and medications reviewed and updated.  Review of Systems  Per HPI unless specifically indicated above     Objective:    BP 118/74 (BP Location: Left Arm, Patient Position: Sitting)   Pulse 74   Temp 98.2 F (36.8 C) (Oral)   Resp 16   Ht 5\' 1"  (1.549 m)   Wt 245 lb 14.4 oz (111.5 kg)   LMP 09/15/2017   SpO2 97%   BMI 46.46 kg/m   Wt Readings from Last 3 Encounters:  04/12/21 245 lb 14.4 oz (111.5 kg)  03/10/21 236 lb (107 kg)  02/28/21 240 lb (108.9 kg)    Physical Exam Constitutional:      General: She is not in acute distress.    Appearance: Normal appearance. She is well-developed.  HENT:     Head: Normocephalic and atraumatic.  Eyes:     General: Lids are normal. No scleral icterus.       Right eye:  No discharge.        Left eye: No discharge.     Conjunctiva/sclera: Conjunctivae normal.  Neck:     Vascular: No carotid bruit or JVD.  Cardiovascular:     Rate and Rhythm: Normal rate and regular rhythm.     Heart sounds: Normal heart sounds.  Pulmonary:     Effort: Pulmonary effort is normal. No respiratory distress.     Breath sounds: Normal breath sounds.  Abdominal:     Palpations: There is no hepatomegaly or splenomegaly.  Musculoskeletal:        General: Normal range of motion.     Cervical back: Normal range of motion and neck supple.  Skin:    General: Skin is warm and dry.     Coloration: Skin is not pale.     Findings: No rash.  Neurological:     Mental Status: She is alert and oriented to person, place, and time.  Psychiatric:        Behavior: Behavior normal.        Thought Content: Thought content normal.        Judgment: Judgment normal.    Results for orders placed or performed during the hospital encounter of 11/17/20  SARS CORONAVIRUS 2 (TAT 6-24 HRS) Nasopharyngeal Nasopharyngeal Swab   Specimen: Nasopharyngeal Swab  Result Value Ref Range   SARS Coronavirus  2 NEGATIVE NEGATIVE      Assessment & Plan:   Problem List Items Addressed This Visit       Unprioritized   Benign essential HTN    Stable, continue present medications.         Morbid obesity ([place])    Frustrated with weight and has led to high PHQ9 scores.  States work pays for [employer] but hasn't tried it yet.  Encouraged to look into it.         Other Visit Diagnoses     Need for Tdap vaccination    -  Primary   Relevant Orders   Tdap vaccine greater than or equal to 7yo IM (Completed)        Follow up plan: Return in about 3 months (around 07/13/2021), or To discuss weight progress.

## 2021-04-12 NOTE — Assessment & Plan Note (Signed)
Frustrated with weight and has led to high PHQ9 scores.  States work pays for [employer] but hasn't tried it yet.  Encouraged to look into it.

## 2021-04-16 ENCOUNTER — Other Ambulatory Visit: Payer: Self-pay

## 2021-04-16 ENCOUNTER — Ambulatory Visit
Admission: EM | Admit: 2021-04-16 | Discharge: 2021-04-16 | Disposition: A | Discharge status: Home / Self Care | Payer: BC Managed Care – PPO

## 2021-04-16 DIAGNOSIS — N644 Mastodynia: Secondary | ICD-10-CM | POA: Diagnosis not present

## 2021-04-16 NOTE — ED Triage Notes (Signed)
Pt c/o bilateral breast pain states they feel like they are on fire, states that it fills like her breast are full. Had tetanus shot last week. Also c/o vomiting and diarrhea since the shot

## 2021-04-16 NOTE — Discharge Instructions (Addendum)
Use over-the-counter Tylenol and ibuprofen according to the package instructions as needed for pain and discomfort.  Apply cool compresses to both breast as needed for comfort.  Contact your primary care provider to discuss your symptoms, let her know that you were evaluated in the urgent care, and have her schedule a diagnostic mammogram of both breasts.  If you develop increasing pain, develop redness of either breast, or discharge from your nipples please return for reevaluation, see your primary care provider, or go to the ER.

## 2021-04-16 NOTE — ED Provider Notes (Signed)
[facility]    CSN: [id number] Arrival date & time: 04/16/21  1246      History   Chief Complaint Chief Complaint  Patient presents with   Breast Pain    HPI Cheyenne Walker is a 50 y.o. female.   HPI  50 year old female here for evaluation of bilateral breast pain.  Patient reports that she received a tetanus shot 5 days ago and then 4 days ago she developed nausea, vomiting, diarrhea, and decreased appetite.  She reports that those symptoms resolved yesterday and she started getting her appetite back but then she developed this pain she states that initially it was on the top and bottom of both breasts and today it is on the left and right side of each breast.  She states he feels like they are full and on fire.  She has not had any redness to her breasts or drainage from the nipple.  Patient reports that she gets a mammogram every year.  The last reported mammogram result in epic is from 04/14/2019.  Past Medical History:  Diagnosis Date   ADD (attention deficit disorder)    Hypertension    Vitamin D deficiency     Patient Active Problem List   Diagnosis Date Noted   Lumbar radiculopathy (L5) 10/08/2018   Morbid obesity ([place]) 10/08/2018   Chronic pain syndrome 10/08/2018   DDD (degenerative disc disease), lumbosacral 05/01/2017   Anxiety 04/03/2015   Benign essential HTN 04/03/2015   Allergic rhinitis 04/03/2015   Gastro-esophageal reflux disease without esophagitis 04/03/2015   Major depression, recurrent, chronic ([place]) 04/03/2015   Morbid obesity with BMI of 40.0-44.9, adult ([place]) Q000111Q   Dysmetabolic syndrome Q000111Q   Numerous moles 04/03/2015   Plantar fasciitis 04/03/2015   PTSD (post-traumatic stress disorder) 04/03/2015   Vitamin D deficiency Q000111Q   Umbilical hernia without obstruction and without gangrene 04/03/2015    Past Surgical History:  Procedure Laterality Date   CHOLECYSTECTOMY  2006   HERNIA REPAIR  10/2014    OB  History     Gravida  5   Para  4   Term  4   Preterm  0   AB  1   Living  4      SAB      IAB      Ectopic      Multiple      Live Births               Home Medications    Prior to Admission medications   Medication Sig Start Date End Date Taking? Authorizing Provider  triamterene-hydrochlorothiazide (MAXZIDE-25) 37.5-25 MG tablet Take 1 tablet by mouth daily. 04/12/21  Yes Kathrine Haddock, NP  cetirizine (ZYRTEC ALLERGY) 10 MG tablet Take 1 tablet (10 mg total) by mouth daily. Patient not taking: Reported on 04/12/2021 03/10/21   Coral Spikes, DO  fluticasone [facility]) 50 MCG/ACT nasal spray Place 2 sprays into both nostrils daily. Patient not taking: Reported on 04/12/2021 03/10/21   Coral Spikes, DO  omeprazole (PRILOSEC) 20 MG capsule Take 1 capsule (20 mg total) by mouth 2 (two) times daily before a meal. 07/25/20 08/30/20  Faustino Congress, NP  pregabalin (LYRICA) 50 MG capsule Take 1-3 capsules (50-150 mg total) by mouth at bedtime as needed. 07/18/20 08/30/20  Steele Sizer, MD    Family History Family History  Problem Relation Age of Onset   Hypertension Mother    Hypertension Sister    Hypertension Brother  Social History Social History   Tobacco Use   Smoking status: Never   Smokeless tobacco: Never  Vaping Use   Vaping Use: Never used  Substance Use Topics   Alcohol use: No    Alcohol/week: 0.0 standard drinks   Drug use: No     Allergies   Ace inhibitors   Review of Systems Review of Systems  Constitutional:  Negative for activity change, appetite change and fever.  Skin:  Negative for color change and rash.  Hematological: Negative.   Psychiatric/Behavioral: Negative.      Physical Exam Triage Vital Signs ED Triage Vitals  Enc Vitals Group     BP 04/16/21 1322 129/69     Pulse Rate 04/16/21 1322 66     Resp 04/16/21 1322 17     Temp 04/16/21 1322 99.1 F (37.3 C)     Temp Source 04/16/21 1322 Oral     SpO2  04/16/21 1322 99 %     Weight 04/16/21 1321 235 lb (106.6 kg)     Height --      Head Circumference --      Peak Flow --      Pain Score 04/16/21 1321 9     Pain Loc --      Pain Edu? --      Excl. in [place]? --    No data found.  Updated Vital Signs BP 129/69 (BP Location: Left Arm)   Pulse 66   Temp 99.1 F (37.3 C) (Oral)   Resp 17   Wt 235 lb (106.6 kg)   LMP 09/15/2017   SpO2 99%   BMI 44.40 kg/m   Visual Acuity Right Eye Distance:   Left Eye Distance:   Bilateral Distance:    Right Eye Near:   Left Eye Near:    Bilateral Near:     Physical Exam Vitals and nursing note reviewed. Exam conducted with a chaperone present Kristena Clas, CMA).  Constitutional:      Appearance: Normal appearance. She is obese. She is not ill-appearing.  HENT:     Head: Normocephalic and atraumatic.  Pulmonary:     Effort: Pulmonary effort is normal.  Skin:    General: Skin is warm and dry.     Capillary Refill: Capillary refill takes less than 2 seconds.     Findings: No erythema.  Neurological:     General: No focal deficit present.     Mental Status: She is alert and oriented to person, place, and time.  Psychiatric:        Mood and Affect: Mood normal.        Behavior: Behavior normal.        Thought Content: Thought content normal.        Judgment: Judgment normal.     UC Treatments / Results  Labs (all labs ordered are listed, but only abnormal results are displayed) Labs Reviewed - No data to display  EKG   Radiology No results found.  Procedures Procedures (including critical care time)  Medications Ordered in UC Medications - No data to display  Initial Impression / Assessment and Plan / UC Course  I have reviewed the triage vital signs and the nursing notes.  Pertinent labs & imaging results that were available during my care of the patient were reviewed by me and considered in my medical decision making (see chart for details).  Patient is a  nontoxic-appearing 50 year old female here for evaluation of bilateral breast pain stating that  they feel heavy and as if they are on fire that started yesterday.  4 days ago she received a tetanus booster and reports that she is never had any side effect like this from receiving a vaccine in the past.  She denies any other vaccines.  She denies redness of her breasts or drainage from her breast.  She states that yesterday she felt the pain was on the top and today it is on the sides and more underneath.  She has not had any discharge from her nipples.  Patient states that she is gets a screening mammogram every year in July.  There is an entry for 04/12/2019 that showed scattered areas of fibroglandular density in both breasts but no masses are otherwise abnormal.  There is an entry for an outpatient imaging visit on 04/12/2020 but there is no accompanying report.  Patient's physical exam reveals bilateral breasts that are free of lesions or erythema.  Breast exam was performed with Etana Clas, CMA in attendance as a chaperone.  Patient does have tenderness on the medial and lateral aspects of both breasts as well as inferiorly.  There is no nipple discharge that is other free-flowing or able to be expressed bilaterally.  There is no tenderness with palpation of the tail of the breast up into the axilla and there is no axillary lymphadenopathy appreciated on exam.  I am unclear as to the source of the patient's breast pain.  I do not believe it is secondary to the tetanus vaccine.  I have recommended the patient that she contact her primary care provider to arrange a diagnostic mammogram of both breasts to evaluate for any abnormalities.  In the meantime to use Tylenol and ibuprofen as needed for discomfort and also apply cool compresses.  She was advised that if she develops any redness of her breast, discharge from her nipples, or fever she is to return for reevaluation or go to the emergency  department.   Final Clinical Impressions(s) / UC Diagnoses   Final diagnoses:  Breast pain in female     Discharge Instructions      Use over-the-counter Tylenol and ibuprofen according to the package instructions as needed for pain and discomfort.  Apply cool compresses to both breast as needed for comfort.  Contact your primary care provider to discuss your symptoms, let her know that you were evaluated in the urgent care, and have her schedule a diagnostic mammogram of both breasts.  If you develop increasing pain, develop redness of either breast, or discharge from your nipples please return for reevaluation, see your primary care provider, or go to the ER.     ED Prescriptions   None    PDMP not reviewed this encounter.   Margarette [place], NP 04/16/21 1446

## 2021-04-17 DIAGNOSIS — Z1231 Encounter for screening mammogram for malignant neoplasm of breast: Secondary | ICD-10-CM | POA: Diagnosis not present

## 2021-05-03 NOTE — Progress Notes (Deleted)
Name: Cheyenne Walker   MRN: [id number]    DOB: 19-Jul-1971   Date:05/03/2021       Progress Note  Subjective  Chief Complaint  Medication Refill  HPI  MDD: she came in last year feeling very down, she took zoloft and Adderal and felt better, but she states relationship with her kids improved and she is doing well now, and stopped medication. phq 9 today is normal, explained that based on results she is depressed, but she states she thinks likely because of back pain, does not feel sad.   Morbid obesity: BMI above 40 she lost a few pounds since last visit, she is back eating ice crease and not sure why she lost weight.   Low HDL: discussed eating more fish and tree nuts, needs to increase physical activity   HTN: bp is at goal, she is compliant with medication Triamterene hcz and denies side effects. She denies chest pain, palpitation or sob.   DDD lumbar spine/Chronic pain: she has been taking diclofenac prn, not daily, but it helps with pain and inflammation. She feels stiff and took muscle relaxer in the past but did not help, she tried  Gabapentin  But it was inefective . She has been to the pain clinic and diclofenac is what works the best for her . She states her pain is constant, bothers her when she turns during her sleep. She states more stiff when she first gets up in am's, it improves when moving but at the end of the day it gets worse. She states staying in one position for a long time makes it worse. Pain is usually 8/10, no longer having radiculitis. She is willing to try Lyrica at night, explained risk of daily nsaid's use, may also add Tylenol    Patient Active Problem List   Diagnosis Date Noted   Lumbar radiculopathy (L5) 10/08/2018   Morbid obesity ([place]) 10/08/2018   Chronic pain syndrome 10/08/2018   DDD (degenerative disc disease), lumbosacral 05/01/2017   Anxiety 04/03/2015   Benign essential HTN 04/03/2015   Allergic rhinitis 04/03/2015   Gastro-esophageal reflux  disease without esophagitis 04/03/2015   Major depression, recurrent, chronic ([place]) 04/03/2015   Morbid obesity with BMI of 40.0-44.9, adult ([place]) Q000111Q   Dysmetabolic syndrome Q000111Q   Numerous moles 04/03/2015   Plantar fasciitis 04/03/2015   PTSD (post-traumatic stress disorder) 04/03/2015   Vitamin D deficiency Q000111Q   Umbilical hernia without obstruction and without gangrene 04/03/2015    Past Surgical History:  Procedure Laterality Date   CHOLECYSTECTOMY  2006   HERNIA REPAIR  10/2014    Family History  Problem Relation Age of Onset   Hypertension Mother    Hypertension Sister    Hypertension Brother     Social History   Tobacco Use   Smoking status: Never   Smokeless tobacco: Never  Substance Use Topics   Alcohol use: No    Alcohol/week: 0.0 standard drinks     Current Outpatient Medications:    cetirizine (ZYRTEC ALLERGY) 10 MG tablet, Take 1 tablet (10 mg total) by mouth daily. (Patient not taking: Reported on 04/12/2021), Disp: 30 tablet, Rfl: 0   fluticasone (FLONASE) 50 MCG/ACT nasal spray, Place 2 sprays into both nostrils daily. (Patient not taking: Reported on 04/12/2021), Disp: 16 g, Rfl: 0   triamterene-hydrochlorothiazide (MAXZIDE-25) 37.5-25 MG tablet, Take 1 tablet by mouth daily., Disp: 90 tablet, Rfl: 1  Allergies  Allergen Reactions   Ace Inhibitors Other (See Comments)  Cough. Pt states specifically catopril    I personally reviewed {Reviewed:[id number]} with the patient/caregiver today.   ROS  ***  Objective  There were no vitals filed for this visit.  There is no height or weight on file to calculate BMI.  Physical Exam ***  No results found for this or any previous visit (from the past 2160 hour(s)).  Diabetic Foot Exam: Diabetic Foot Exam - Simple   No data filed    ***  PHQ2/9: Depression screen [facility] 2/9 04/12/2021 07/18/2020 07/19/2019 06/18/2019 12/16/2018  Decreased Interest 0 1 0 2 2  Down, Depressed,  Hopeless 0 0 0 2 1  PHQ - 2 Score 0 1 0 4 3  Altered sleeping 2 3 0 2 1  Tired, decreased energy 3 3 0 3 3  Change in appetite '3 3 1 3 3  '$ Feeling bad or failure about yourself  3 0 0 2 1  Trouble concentrating 0 0 0 2 1  Moving slowly or fidgety/restless 1 0 0 2 0  Suicidal thoughts 0 0 0 0 0  PHQ-9 Score '12 10 1 18 12  '$ Difficult doing work/chores Not difficult at all Somewhat difficult Not difficult at all Very difficult Somewhat difficult  Some recent data might be hidden    phq 9 is {gen pos [id number] ***  Fall Risk: Fall Risk  04/12/2021 07/18/2020 07/19/2019 06/18/2019 12/16/2018  Falls in the past year? 1 0 0 - 0  Number falls in past yr: 0 0 0 0 0  Injury with Fall? 0 0 0 0 0   ***   Functional Status Survey:   ***   Assessment & Plan  *** There are no diagnoses linked to this encounter.

## 2021-05-07 ENCOUNTER — Ambulatory Visit: Payer: BC Managed Care – PPO | Admitting: Family Medicine

## 2021-07-17 ENCOUNTER — Ambulatory Visit: Payer: BC Managed Care – PPO | Admitting: Family Medicine

## 2021-07-17 NOTE — Progress Notes (Deleted)
Name: Cheyenne Walker   MRN: [id number]    DOB: 02/18/1971   Date:07/17/2021       Progress Note  Subjective  Chief Complaint  Follow Up  HPI  MDD: she came in last year feeling very down, she took zoloft and Adderal and felt better, but she states relationship with her kids improved and she is doing well now, and stopped medication. phq 9 today is normal, explained that based on results she is depressed, but she states she thinks likely because of back pain, does not feel sad.   Morbid obesity: BMI above 40 she lost a few pounds since last visit, she is back eating ice crease and not sure why she lost weight.   Low HDL: discussed eating more fish and tree nuts, needs to increase physical activity   HTN: bp is at goal, she is compliant with medication Triamterene hcz and denies side effects. She denies chest pain, palpitation or sob.   DDD lumbar spine/Chronic pain: she has been taking diclofenac prn, not daily, but it helps with pain and inflammation. She feels stiff and took muscle relaxer in the past but did not help, she tried  Gabapentin  But it was inefective . She has been to the pain clinic and diclofenac is what works the best for her . She states her pain is constant, bothers her when she turns during her sleep. She states more stiff when she first gets up in am's, it improves when moving but at the end of the day it gets worse. She states staying in one position for a long time makes it worse. Pain is usually 8/10, no longer having radiculitis. She is willing to try Lyrica at night, explained risk of daily nsaid's use, may also add Tylenol    Patient Active Problem List   Diagnosis Date Noted   Lumbar radiculopathy (L5) 10/08/2018   Morbid obesity ([place]) 10/08/2018   Chronic pain syndrome 10/08/2018   DDD (degenerative disc disease), lumbosacral 05/01/2017   Anxiety 04/03/2015   Benign essential HTN 04/03/2015   Allergic rhinitis 04/03/2015   Gastro-esophageal reflux disease  without esophagitis 04/03/2015   Major depression, recurrent, chronic ([place]) 04/03/2015   Morbid obesity with BMI of 40.0-44.9, adult ([place]) 62/83/6629   Dysmetabolic syndrome 47/65/4650   Numerous moles 04/03/2015   Plantar fasciitis 04/03/2015   PTSD (post-traumatic stress disorder) 04/03/2015   Vitamin D deficiency 35/46/5681   Umbilical hernia without obstruction and without gangrene 04/03/2015    Past Surgical History:  Procedure Laterality Date   CHOLECYSTECTOMY  2006   HERNIA REPAIR  10/2014    Family History  Problem Relation Age of Onset   Hypertension Mother    Hypertension Sister    Hypertension Brother     Social History   Tobacco Use   Smoking status: Never   Smokeless tobacco: Never  Substance Use Topics   Alcohol use: No    Alcohol/week: 0.0 standard drinks     Current Outpatient Medications:    cetirizine (ZYRTEC ALLERGY) 10 MG tablet, Take 1 tablet (10 mg total) by mouth daily. (Patient not taking: Reported on 04/12/2021), Disp: 30 tablet, Rfl: 0   fluticasone (FLONASE) 50 MCG/ACT nasal spray, Place 2 sprays into both nostrils daily. (Patient not taking: Reported on 04/12/2021), Disp: 16 g, Rfl: 0   triamterene-hydrochlorothiazide (MAXZIDE-25) 37.5-25 MG tablet, Take 1 tablet by mouth daily., Disp: 90 tablet, Rfl: 1  Allergies  Allergen Reactions   Ace Inhibitors Other (See Comments)  Cough. Pt states specifically catopril    I personally reviewed active problem list, medication list, allergies, family history, social history, health maintenance with the patient/caregiver today.   ROS  ***  Objective  There were no vitals filed for this visit.  There is no height or weight on file to calculate BMI.  Physical Exam ***  No results found for this or any previous visit (from the past 2160 hour(s)).    PHQ2/9: Depression screen [facility] 2/9 04/12/2021 07/18/2020 07/19/2019 06/18/2019 12/16/2018  Decreased Interest 0 1 0 2 2  Down, Depressed,  Hopeless 0 0 0 2 1  PHQ - 2 Score 0 1 0 4 3  Altered sleeping 2 3 0 2 1  Tired, decreased energy 3 3 0 3 3  Change in appetite 3 3 1 3 3   Feeling bad or failure about yourself  3 0 0 2 1  Trouble concentrating 0 0 0 2 1  Moving slowly or fidgety/restless 1 0 0 2 0  Suicidal thoughts 0 0 0 0 0  PHQ-9 Score 12 10 1 18 12   Difficult doing work/chores Not difficult at all Somewhat difficult Not difficult at all Very difficult Somewhat difficult  Some recent data might be hidden    phq 9 is {gen pos [id number]}   Fall Risk: Fall Risk  04/12/2021 07/18/2020 07/19/2019 06/18/2019 12/16/2018  Falls in the past year? 1 0 0 - 0  Number falls in past yr: 0 0 0 0 0  Injury with Fall? 0 0 0 0 0      Functional Status Survey:      Assessment & Plan  *** There are no diagnoses linked to this encounter.
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
## 2021-09-20 ENCOUNTER — Other Ambulatory Visit (HOSPITAL_COMMUNITY)
Admission: RE | Admit: 2021-09-20 | Discharge: 2021-09-20 | Disposition: A | Discharge status: Home / Self Care | Payer: BC Managed Care – PPO | Source: Ambulatory Visit | Attending: Family Medicine | Admitting: Family Medicine

## 2021-09-20 ENCOUNTER — Ambulatory Visit (INDEPENDENT_AMBULATORY_CARE_PROVIDER_SITE_OTHER): Payer: BC Managed Care – PPO | Admitting: Family Medicine

## 2021-09-20 ENCOUNTER — Encounter: Payer: Self-pay | Admitting: Family Medicine

## 2021-09-20 DIAGNOSIS — Z1159 Encounter for screening for other viral diseases: Secondary | ICD-10-CM | POA: Diagnosis not present

## 2021-09-20 DIAGNOSIS — Z1211 Encounter for screening for malignant neoplasm of colon: Secondary | ICD-10-CM | POA: Diagnosis not present

## 2021-09-20 DIAGNOSIS — E8881 Metabolic syndrome: Secondary | ICD-10-CM | POA: Diagnosis not present

## 2021-09-20 DIAGNOSIS — Z113 Encounter for screening for infections with a predominantly sexual mode of transmission: Secondary | ICD-10-CM

## 2021-09-20 DIAGNOSIS — J069 Acute upper respiratory infection, unspecified: Secondary | ICD-10-CM

## 2021-09-20 DIAGNOSIS — I10 Essential (primary) hypertension: Secondary | ICD-10-CM

## 2021-09-20 DIAGNOSIS — M5137 Other intervertebral disc degeneration, lumbosacral region: Secondary | ICD-10-CM

## 2021-09-20 DIAGNOSIS — N393 Stress incontinence (female) (male): Secondary | ICD-10-CM

## 2021-09-20 DIAGNOSIS — Z124 Encounter for screening for malignant neoplasm of cervix: Secondary | ICD-10-CM

## 2021-09-20 DIAGNOSIS — Z0000 Encounter for general adult medical examination without abnormal findings: Secondary | ICD-10-CM

## 2021-09-20 DIAGNOSIS — E786 Lipoprotein deficiency: Secondary | ICD-10-CM

## 2021-09-20 DIAGNOSIS — F325 Major depressive disorder, single episode, in full remission: Secondary | ICD-10-CM

## 2021-09-20 DIAGNOSIS — E559 Vitamin D deficiency, unspecified: Secondary | ICD-10-CM

## 2021-09-20 MED ORDER — TRIAMTERENE-HCTZ 37.5-25 MG PO TABS: 1.0000 | ORAL_TABLET | Freq: Every day | ORAL | 1 refills | Status: DC

## 2021-09-20 MED ORDER — DICLOFENAC SODIUM 75 MG PO TBEC: 75.0000 mg | DELAYED_RELEASE_TABLET | Freq: Two times a day (BID) | ORAL | 0 refills | Status: DC

## 2021-09-21 ENCOUNTER — Telehealth: Payer: Self-pay

## 2021-09-21 LAB — LIPID PANEL
Cholesterol: 174 mg/dL (ref <200)
HDL: 44 mg/dL — ABNORMAL LOW (ref >=50)
LDL Cholesterol (Calc): 97 mg/dL (calc)
Non-HDL Cholesterol (Calc): 130 mg/dL (calc) — ABNORMAL HIGH (ref <130)
Total CHOL/HDL Ratio: 4 (calc) (ref <5.0)
Triglycerides: 213 mg/dL — ABNORMAL HIGH (ref <150)

## 2021-09-21 LAB — HEMOGLOBIN A1C
Hgb A1c MFr Bld: 5.4 % of total Hgb (ref <5.7)
Mean Plasma Glucose: 108 mg/dL
eAG (mmol/L): 6 mmol/L

## 2021-09-21 LAB — VITAMIN D 25 HYDROXY (VIT D DEFICIENCY, FRACTURES): Vit D, 25-Hydroxy: 28 ng/mL — ABNORMAL LOW (ref 30–100)

## 2021-09-21 LAB — COMPLETE METABOLIC PANEL WITH GFR
AG Ratio: 1.3 (calc) (ref 1.0–2.5)
ALT: 25 U/L (ref 6–29)
AST: 21 U/L (ref 10–35)
Albumin: 4.2 g/dL (ref 3.6–5.1)
Alkaline phosphatase (APISO): 81 U/L (ref 31–125)
BUN: 19 mg/dL (ref 7–25)
CO2: 26 mmol/L (ref 20–32)
Calcium: 9.6 mg/dL (ref 8.6–10.2)
Chloride: 104 mmol/L (ref 98–110)
Creat: 0.69 mg/dL (ref 0.50–0.99)
Globulin: 3.2 g/dL (calc) (ref 1.9–3.7)
Glucose, Bld: 88 mg/dL (ref 65–99)
Potassium: 3.5 mmol/L (ref 3.5–5.3)
Sodium: 139 mmol/L (ref 135–146)
Total Bilirubin: 0.2 mg/dL (ref 0.2–1.2)
Total Protein: 7.4 g/dL (ref 6.1–8.1)
eGFR: 107 mL/min/{1.73_m2} (ref >=60)

## 2021-09-21 LAB — CYTOLOGY - PAP
Chlamydia: NEGATIVE
Comment: NEGATIVE
Comment: NEGATIVE
Comment: NORMAL
Diagnosis: NEGATIVE
High risk HPV: NEGATIVE
Neisseria Gonorrhea: NEGATIVE

## 2021-09-21 LAB — CBC WITH DIFFERENTIAL/PLATELET
Absolute Monocytes: 623 cells/uL (ref 200–950)
Basophils Absolute: 74 cells/uL (ref 0–200)
Basophils Relative: 0.9 %
Eosinophils Absolute: 238 cells/uL (ref 15–500)
Eosinophils Relative: 2.9 %
HCT: 42.3 % (ref 35.0–45.0)
Hemoglobin: 13.9 g/dL (ref 11.7–15.5)
Lymphs Abs: 2886 cells/uL (ref 850–3900)
MCH: 28.7 pg (ref 27.0–33.0)
MCHC: 32.9 g/dL (ref 32.0–36.0)
MCV: 87.4 fL (ref 80.0–100.0)
MPV: 10.2 fL (ref 7.5–12.5)
Monocytes Relative: 7.6 %
Neutro Abs: 4379 cells/uL (ref 1500–7800)
Neutrophils Relative %: 53.4 %
Platelets: 392 10*3/uL (ref 140–400)
RBC: 4.84 10*6/uL (ref 3.80–5.10)
RDW: 13.6 % (ref 11.0–15.0)
Total Lymphocyte: 35.2 %
WBC: 8.2 10*3/uL (ref 3.8–10.8)

## 2021-09-21 LAB — RPR: RPR Ser Ql: NONREACTIVE

## 2021-09-21 LAB — NOVEL CORONAVIRUS, NAA: SARS-CoV-2, NAA: NOT DETECTED

## 2021-09-21 LAB — SARS-COV-2, NAA 2 DAY TAT

## 2021-09-21 LAB — HEPATITIS C ANTIBODY
Hepatitis C Ab: NONREACTIVE
SIGNAL TO CUT-OFF: 0.08 (ref <1.00)

## 2021-09-21 LAB — HIV ANTIBODY (ROUTINE TESTING W REFLEX): HIV 1&2 Ab, 4th Generation: NONREACTIVE

## 2021-09-21 NOTE — Telephone Encounter (Signed)
Pt given results per notes of Dr. Ancil Boozer on 09/21/21. Pt verbalized understanding.

## 2021-09-26 ENCOUNTER — Telehealth: Payer: Self-pay

## 2021-09-26 NOTE — Telephone Encounter (Signed)
CALLED PATIENT NO ANSWER LEFT VOICEMAIL FOR A CALL BACK ? ?

## 2021-10-30 ENCOUNTER — Other Ambulatory Visit: Payer: Self-pay | Admitting: Family Medicine

## 2021-10-30 DIAGNOSIS — M5137 Other intervertebral disc degeneration, lumbosacral region: Secondary | ICD-10-CM

## 2021-11-06 ENCOUNTER — Encounter: Payer: Self-pay | Admitting: Emergency Medicine

## 2021-11-06 ENCOUNTER — Ambulatory Visit
Admission: EM | Admit: 2021-11-06 | Discharge: 2021-11-06 | Disposition: A | Discharge status: Home / Self Care | Payer: BC Managed Care – PPO | Attending: Urgent Care | Admitting: Urgent Care

## 2021-11-06 ENCOUNTER — Other Ambulatory Visit: Payer: Self-pay

## 2021-11-06 DIAGNOSIS — R52 Pain, unspecified: Secondary | ICD-10-CM

## 2021-11-06 DIAGNOSIS — I10 Essential (primary) hypertension: Secondary | ICD-10-CM

## 2021-11-06 DIAGNOSIS — R509 Fever, unspecified: Secondary | ICD-10-CM

## 2021-11-06 DIAGNOSIS — R0602 Shortness of breath: Secondary | ICD-10-CM

## 2021-11-06 DIAGNOSIS — J020 Streptococcal pharyngitis: Secondary | ICD-10-CM

## 2021-11-06 LAB — POCT INFLUENZA A/B
Influenza A, POC: NEGATIVE
Influenza B, POC: NEGATIVE

## 2021-11-06 LAB — POCT RAPID STREP A (OFFICE): Rapid Strep A Screen: POSITIVE — AB

## 2021-11-06 MED ORDER — ACETAMINOPHEN 325 MG PO TABS
650.0000 mg | ORAL_TABLET | Freq: Once | ORAL | Status: AC
  Administered 2021-11-06: 650 mg via ORAL

## 2021-11-06 MED ORDER — CEFDINIR 300 MG PO CAPS: 300.0000 mg | ORAL_CAPSULE | Freq: Two times a day (BID) | ORAL | 0 refills | Status: DC

## 2021-11-06 NOTE — ED Triage Notes (Signed)
Pt c/o ST, bodyaches, cough, SOB x 3 days

## 2021-11-06 NOTE — ED Provider Notes (Addendum)
Cheyenne Walker   MRN: [id number] DOB: 1971/01/16  Subjective:   Cheyenne Walker is a 51 y.o. female presenting for 3 day history of acute onset shortness of breath, throat pain, coughing, body aches. Took ibuprofen last night. No chest pain, history of respiratory disorders.  Has a history of allergic rhinitis.  She is not a smoker.  No current facility-administered medications for this encounter.  Current Outpatient Medications:    cetirizine (ZYRTEC ALLERGY) 10 MG tablet, Take 1 tablet (10 mg total) by mouth daily. (Patient not taking: Reported on 04/12/2021), Disp: 30 tablet, Rfl: 0   diclofenac (VOLTAREN) 75 MG EC tablet, TAKE 1 TABLET BY MOUTH TWICE A DAY, Disp: 60 tablet, Rfl: 0   fluticasone (FLONASE) 50 MCG/ACT nasal spray, Place 2 sprays into both nostrils daily., Disp: 16 g, Rfl: 0   triamterene-hydrochlorothiazide (MAXZIDE-25) 37.5-25 MG tablet, Take 1 tablet by mouth daily., Disp: 90 tablet, Rfl: 1   Allergies  Allergen Reactions   Ace Inhibitors Other (See Comments)    Cough. Pt states specifically catopril    Past Medical History:  Diagnosis Date   ADD (attention deficit disorder)    Hypertension    Vitamin D deficiency      Past Surgical History:  Procedure Laterality Date   CHOLECYSTECTOMY  2006   HERNIA REPAIR  10/2014    Family History  Problem Relation Age of Onset   Hypertension Mother    Hypertension Sister    Hypertension Brother     Social History   Tobacco Use   Smoking status: Never   Smokeless tobacco: Never  Vaping Use   Vaping Use: Never used  Substance Use Topics   Alcohol use: No    Alcohol/week: 0.0 standard drinks   Drug use: No    ROS   Objective:   Vitals: BP (!) 171/90 (BP Location: Left Arm)    Pulse 86    Temp (!) 100.5 F (38.1 C) (Oral)    Resp 18    LMP 09/15/2017    SpO2 96%   Physical Exam Constitutional:      General: She is not in acute distress.    Appearance: Normal appearance. She is  well-developed. She is not ill-appearing, toxic-appearing or diaphoretic.  HENT:     Head: Normocephalic and atraumatic.     Nose: Nose normal.     Mouth/Throat:     Mouth: Mucous membranes are moist.  Eyes:     General: No scleral icterus.       Right eye: No discharge.        Left eye: No discharge.     Extraocular Movements: Extraocular movements intact.  Cardiovascular:     Rate and Rhythm: Normal rate.  Pulmonary:     Effort: Pulmonary effort is normal.  Skin:    General: Skin is warm and dry.  Neurological:     General: No focal deficit present.     Mental Status: She is alert and oriented to person, place, and time.  Psychiatric:        Mood and Affect: Mood normal.        Behavior: Behavior normal.   Patient given Tylenol in clinic for her fever.  Results for orders placed or performed during the hospital encounter of 11/06/21 (from the past 24 hour(s))  POCT rapid strep A     Status: Abnormal   Collection Time: 11/06/21  8:56 AM  Result Value Ref Range   Rapid Strep A Screen Positive (  A) Negative   Rapid flu test was negative.  Assessment and Plan :   PDMP not reviewed this encounter.  1. Strep pharyngitis   2. Fever, unspecified   3. Shortness of breath   4. Body aches   5. Essential hypertension     Will treat for strep pharyngitis.  Patient is to start Cefdinir as amoxicillin is on backorder, use supportive care otherwise. Counseled patient on potential for adverse effects with medications prescribed/recommended today, ER and return-to-clinic precautions discussed, patient verbalized understanding.    Jaynee Eagles, [place] 11/06/21 [id number]

## 2021-12-03 ENCOUNTER — Other Ambulatory Visit: Payer: Self-pay | Admitting: Family Medicine

## 2021-12-03 DIAGNOSIS — M5137 Other intervertebral disc degeneration, lumbosacral region: Secondary | ICD-10-CM

## 2021-12-16 ENCOUNTER — Other Ambulatory Visit: Payer: Self-pay

## 2021-12-16 ENCOUNTER — Ambulatory Visit
Admission: EM | Admit: 2021-12-16 | Discharge: 2021-12-16 | Disposition: A | Discharge status: Home / Self Care | Payer: BC Managed Care – PPO | Attending: Emergency Medicine | Admitting: Emergency Medicine

## 2021-12-16 ENCOUNTER — Encounter: Payer: Self-pay | Admitting: Emergency Medicine

## 2021-12-16 DIAGNOSIS — R0981 Nasal congestion: Secondary | ICD-10-CM | POA: Diagnosis not present

## 2021-12-16 DIAGNOSIS — J302 Other seasonal allergic rhinitis: Secondary | ICD-10-CM

## 2021-12-16 MED ORDER — DEXAMETHASONE SODIUM PHOSPHATE 10 MG/ML IJ SOLN
10.0000 mg | Freq: Once | INTRAMUSCULAR | Status: AC
  Administered 2021-12-16: 10 mg via INTRAMUSCULAR

## 2021-12-16 NOTE — ED Provider Notes (Signed)
?[facility] ? ? ? ?CSN: [id number] ?Arrival date & time: 12/16/21  0801 ? ? ?  ? ?History   ?Chief Complaint ?Chief Complaint  ?Patient presents with  ? Allergies  ? ? ?HPI ?Cheyenne Walker is a 51 y.o. female.  Patient presents with 2-week history of nasal congestion, runny nose, postnasal drip, mild occasional cough, itchy watery eyes.  No fever, chills, sore throat, shortness of breath, vomiting, diarrhea, or other symptoms.  She has been taking pseudoephedrine, Afrin nasal spray, Allegra, Zyrtec for her symptoms.  Patient was seen at this urgent care on 11/06/2021; diagnosed with strep pharyngitis, fever, shortness of breath, body aches, and hypertension; treated with cefdinir.  Her medical history includes hypertension, GERD, depression, anxiety, PTSD, ADD, morbid obesity, dysmetabolic syndrome, lumbar radiculopathy, chronic pain syndrome. ? ?The history is provided by the patient and medical records.  ? ?Past Medical History:  ?Diagnosis Date  ? ADD (attention deficit disorder)   ? Hypertension   ? Vitamin D deficiency   ? ? ?Patient Active Problem List  ? Diagnosis Date Noted  ? Lumbar radiculopathy (L5) 10/08/2018  ? Chronic pain syndrome 10/08/2018  ? DDD (degenerative disc disease), lumbosacral 05/01/2017  ? Anxiety 04/03/2015  ? Benign essential HTN 04/03/2015  ? Allergic rhinitis 04/03/2015  ? Gastro-esophageal reflux disease without esophagitis 04/03/2015  ? Major depression, recurrent, chronic ([place]) 04/03/2015  ? Morbid obesity with BMI of 40.0-44.9, adult ([place]) 04/03/2015  ? Dysmetabolic syndrome 78/29/5621  ? Numerous moles 04/03/2015  ? Plantar fasciitis 04/03/2015  ? PTSD (post-traumatic stress disorder) 04/03/2015  ? Vitamin D deficiency 04/03/2015  ? Umbilical hernia without obstruction and without gangrene 04/03/2015  ? ? ?Past Surgical History:  ?Procedure Laterality Date  ? CHOLECYSTECTOMY  2006  ? HERNIA REPAIR  10/2014  ? ? ?OB History   ? ? Gravida  ?5  ? Para  ?4  ? Term  ?4  ?  Preterm  ?0  ? AB  ?1  ? Living  ?4  ?  ? ? SAB  ?   ? IAB  ?   ? Ectopic  ?   ? Multiple  ?   ? Live Births  ?   ?   ?  ?  ? ? ? ?Home Medications   ? ?Prior to Admission medications   ?Medication Sig Start Date End Date Taking? Authorizing Provider  ?cefdinir (OMNICEF) 300 MG capsule Take 1 capsule (300 mg total) by mouth 2 (two) times daily. 11/06/21   Jaynee Eagles, PA-C  ?cetirizine (ZYRTEC ALLERGY) 10 MG tablet Take 1 tablet (10 mg total) by mouth daily. ?Patient not taking: Reported on 04/12/2021 03/10/21   Coral Spikes, DO  ?diclofenac (VOLTAREN) 75 MG EC tablet TAKE 1 TABLET BY MOUTH TWICE A DAY 12/03/21   Steele Sizer, MD  ?fluticasone (FLONASE) 50 MCG/ACT nasal spray Place 2 sprays into both nostrils daily. 03/10/21   Coral Spikes, DO  ?triamterene-hydrochlorothiazide (MAXZIDE-25) 37.5-25 MG tablet Take 1 tablet by mouth daily. 09/20/21   Steele Sizer, MD  ?omeprazole (PRILOSEC) 20 MG capsule Take 1 capsule (20 mg total) by mouth 2 (two) times daily before a meal. 07/25/20 08/30/20  Faustino Congress, NP  ?pregabalin (LYRICA) 50 MG capsule Take 1-3 capsules (50-150 mg total) by mouth at bedtime as needed. 07/18/20 08/30/20  Steele Sizer, MD  ? ? ?Family History ?Family History  ?Problem Relation Age of Onset  ? Hypertension Mother   ? Hypertension Sister   ? Hypertension  Brother   ? ? ?Social History ?Social History  ? ?Tobacco Use  ? Smoking status: Never  ? Smokeless tobacco: Never  ?Vaping Use  ? Vaping Use: Never used  ?Substance Use Topics  ? Alcohol use: No  ?  Alcohol/week: 0.0 standard drinks  ? Drug use: No  ? ? ? ?Allergies   ?Ace inhibitors ? ? ?Review of Systems ?Review of Systems  ?Constitutional:  Negative for chills and fever.  ?HENT:  Positive for congestion, postnasal drip and rhinorrhea. Negative for ear pain and sore throat.   ?Eyes:  Positive for discharge and itching. Negative for pain and visual disturbance.  ?Respiratory:  Positive for cough. Negative for shortness of breath.    ?Cardiovascular:  Negative for chest pain and palpitations.  ?Gastrointestinal:  Negative for diarrhea and vomiting.  ?Skin:  Negative for color change and rash.  ?All other systems reviewed and are negative. ? ? ?Physical Exam ?Triage Vital Signs ?ED Triage Vitals  ?Enc Vitals Group  ?   BP   ?   Pulse   ?   Resp   ?   Temp   ?   Temp src   ?   SpO2   ?   Weight   ?   Height   ?   Head Circumference   ?   Peak Flow   ?   Pain Score   ?   Pain Loc   ?   Pain Edu?   ?   Excl. in [place]?   ? ?No data found. ? ?Updated Vital Signs ?BP (!) 135/91   Pulse 73   Temp 98.1 ?F (36.7 ?C)   Resp 18   LMP 09/15/2017   SpO2 96%  ? ?Visual Acuity ?Right Eye Distance:   ?Left Eye Distance:   ?Bilateral Distance:   ? ?Right Eye Near:   ?Left Eye Near:    ?Bilateral Near:    ? ?Physical Exam ?Vitals and nursing note reviewed.  ?Constitutional:   ?   General: She is not in acute distress. ?   Appearance: She is well-developed. She is obese. She is not ill-appearing.  ?HENT:  ?   Right Ear: Tympanic membrane normal.  ?   Left Ear: Tympanic membrane normal.  ?   Nose: Congestion present.  ?   Mouth/Throat:  ?   Mouth: Mucous membranes are moist.  ?   Pharynx: Oropharynx is clear.  ?Cardiovascular:  ?   Rate and Rhythm: Normal rate and regular rhythm.  ?   Heart sounds: Normal heart sounds.  ?Pulmonary:  ?   Effort: Pulmonary effort is normal. No respiratory distress.  ?   Breath sounds: Normal breath sounds.  ?Musculoskeletal:  ?   Cervical back: Neck supple.  ?Skin: ?   General: Skin is warm and dry.  ?Neurological:  ?   Mental Status: She is alert.  ?Psychiatric:     ?   Mood and Affect: Mood normal.     ?   Behavior: Behavior normal.  ? ? ? ?UC Treatments / Results  ?Labs ?(all labs ordered are listed, but only abnormal results are displayed) ?Labs Reviewed - No data to display ? ?EKG ? ? ?Radiology ?No results found. ? ?Procedures ?Procedures (including critical care time) ? ?Medications Ordered in UC ?Medications  ?dexamethasone  (DECADRON) injection 10 mg (10 mg Intramuscular Given 12/16/21 0830)  ? ? ?Initial Impression / Assessment and Plan / UC Course  ?I have reviewed the triage  vital signs and the nursing notes. ? ?Pertinent labs & imaging results that were available during my care of the patient were reviewed by me and considered in my medical decision making (see chart for details). ? ? Seasonal allergies, Nasal congestion.  Treating today with dexamethasone injection.  Instructed patient to take Flonase nasal spray, Xyzal, plain Mucinex.  Instructed her to stop using Afrin nasal spray and pseudoephedrine.  Also discussed that she should stop Zyrtec and Allegra at this time as they do not appear to be working.  Discussed that she could switch back from Xyzal to Zyrtec or Allegra once the pollen counts are not so high.  Instructed her to follow-up with her PCP if her symptoms are not improving.  She agrees to plan of care. ? ? ?Final Clinical Impressions(s) / UC Diagnoses  ? ?Final diagnoses:  ?Seasonal allergies  ?Nasal congestion  ? ? ? ?Discharge Instructions   ? ?  ?Stop using the Afrin nasal spray.  Stop taking pseudoephedrine.  Stop taking Zyrtec and Allegra. ? ?Use Flonase nasal spray and Xyzal for your allergy symptoms.  Additionally you can take plain Mucinex for congestion.   ? ?You were given an injection of dexamethasone today.   ? ?Follow up with your primary care provider if your symptoms are not improving.   ? ? ? ? ? ? ?ED Prescriptions   ?None ?  ? ?PDMP not reviewed this encounter. ?  ?Sharion Balloon, NP ?12/16/21 [phone] ? ?

## 2021-12-16 NOTE — ED Triage Notes (Signed)
Pt here with seasonal allergies that are not being relieved with OTC meds.  ?

## 2021-12-16 NOTE — Discharge Instructions (Addendum)
Stop using the Afrin nasal spray.  Stop taking pseudoephedrine.  Stop taking Zyrtec and Allegra. ? ?Use Flonase nasal spray and Xyzal for your allergy symptoms.  Additionally you can take plain Mucinex for congestion.   ? ?You were given an injection of dexamethasone today.   ? ?Follow up with your primary care provider if your symptoms are not improving.   ? ? ?

## 2022-03-21 ENCOUNTER — Ambulatory Visit: Payer: BC Managed Care – PPO | Admitting: Family Medicine

## 2022-04-15 ENCOUNTER — Other Ambulatory Visit: Payer: Self-pay | Admitting: Family Medicine

## 2022-04-15 DIAGNOSIS — Z1231 Encounter for screening mammogram for malignant neoplasm of breast: Secondary | ICD-10-CM

## 2022-04-17 ENCOUNTER — Inpatient Hospital Stay: Admission: RE | Admit: 2022-04-17 | Payer: BC Managed Care – PPO | Source: Ambulatory Visit

## 2022-05-10 NOTE — Progress Notes (Unsigned)
Name: Cheyenne Walker   MRN: [id number]    DOB: 10-Mar-1971   Date:05/13/2022       Progress Note  Subjective  Chief Complaint  Medication Refill  HPI  MDD: she was feeling better during her last visit , she is living with her daughter and helps with grandchildren. She worries about her son all the time and feels anxious - he got deployed last week to [place]. She is eating more than usual and gaining weight. She is willing to try Duloxetine  FMS: she states for years she has aches and pains on her muscles. It has been worse over the past year , she states went to a concert 5 days ago and is still feeling tired from walking and standing for the concert. Her whole body aches and feels fatigued.   Stress incontinence: going on for months, when she coughs or sneezes, reminded her of kegel exercises     Morbid obesity: BMI above 40 she has been doing a step challenge for work and had  lost 15 lbs in the last 6 months, but now gained 7 lbs back, states eating all the time. She went to a weight loss clinic but too costly, discussed medication options., she is wiling to try saxenda. Denies personal history of pancreatitis of family history of thyroid cancer    Low HDL: she has been more active, discussed increase in tree nuts ,fruits and vegetables    HTN: bp is at goal, she is compliant with medication Triamterene hcz and denies side effects. She denies chest pain, palpitation or sob.   DDD lumbar spine/Chronic pain: she has been taking diclofenac prn, not daily, but it helps with pain and inflammation. She feels stiff and took muscle relaxer in the past but did not help, she tried gabapentin and Lyrica but both inefective. She has been to the pain clinic and diclofenac is what works the best for her . She states her pain is constant. She states more stiff when she first gets up in am's, it improves when moving but at the end of the day it gets worse again, doing better now that she has been walking on a  regular basis. Pain has been around 3/10  no longer having radiculitis.   Vitamin D deficiency: continue supplementation   Patient Active Problem List   Diagnosis Date Noted   Lumbar radiculopathy (L5) 10/08/2018   Chronic pain syndrome 10/08/2018   DDD (degenerative disc disease), lumbosacral 05/01/2017   Anxiety 04/03/2015   Benign essential HTN 04/03/2015   Allergic rhinitis 04/03/2015   Gastro-esophageal reflux disease without esophagitis 04/03/2015   Major depression, recurrent, chronic ([place]) 04/03/2015   Morbid obesity with BMI of 40.0-44.9, adult ([place]) 56/25/6389   Dysmetabolic syndrome 37/34/2876   Numerous moles 04/03/2015   Plantar fasciitis 04/03/2015   PTSD (post-traumatic stress disorder) 04/03/2015   Vitamin D deficiency 81/15/7262   Umbilical hernia without obstruction and without gangrene 04/03/2015    Past Surgical History:  Procedure Laterality Date   CHOLECYSTECTOMY  2006   HERNIA REPAIR  10/2014    Family History  Problem Relation Age of Onset   Hypertension Mother    Hypertension Sister    Hypertension Brother     Social History   Tobacco Use   Smoking status: Never   Smokeless tobacco: Never  Substance Use Topics   Alcohol use: No    Alcohol/week: 0.0 standard drinks of alcohol     Current Outpatient Medications:    cefdinir (  OMNICEF) 300 MG capsule, Take 1 capsule (300 mg total) by mouth 2 (two) times daily., Disp: 20 capsule, Rfl: 0   cetirizine (ZYRTEC ALLERGY) 10 MG tablet, Take 1 tablet (10 mg total) by mouth daily. (Patient not taking: Reported on 04/12/2021), Disp: 30 tablet, Rfl: 0   diclofenac (VOLTAREN) 75 MG EC tablet, TAKE 1 TABLET BY MOUTH TWICE A DAY, Disp: 60 tablet, Rfl: 0   fluticasone (FLONASE) 50 MCG/ACT nasal spray, Place 2 sprays into both nostrils daily., Disp: 16 g, Rfl: 0   triamterene-hydrochlorothiazide (MAXZIDE-25) 37.5-25 MG tablet, Take 1 tablet by mouth daily., Disp: 90 tablet, Rfl: 1  Allergies  Allergen  Reactions   Ace Inhibitors Other (See Comments)    Cough. Pt states specifically catopril    I personally reviewed active problem list, medication list, allergies, family history, social history, health maintenance with the patient/caregiver today.   ROS  Constitutional: Negative for fever or weight change.  Respiratory: Negative for cough and shortness of breath.   Cardiovascular: Negative for chest pain or palpitations.  Gastrointestinal: Negative for abdominal pain, no bowel changes.  Musculoskeletal: Negative for gait problem or joint swelling.  Skin: Negative for rash.  Neurological: Negative for dizziness or headache.  No other specific complaints in a complete review of systems (except as listed in HPI above).   Objective  Vitals:   05/13/22 1323  BP: 122/80  Pulse: 74  Resp: 14  Temp: 98.3 F (36.8 C)  TempSrc: Oral  SpO2: 96%  Weight: 237 lb 12.8 oz (107.9 kg)  Height: '5\' 1"'$  (1.549 m)    Body mass index is 44.93 kg/m.  Physical Exam  Constitutional: Patient appears well-developed and well-nourished. Obese  No distress.  HEENT: head atraumatic, normocephalic, pupils equal and reactive to light, neck supple Cardiovascular: Normal rate, regular rhythm and normal heart sounds.  No murmur heard. No BLE edema. Pulmonary/Chest: Effort normal and breath sounds normal. No respiratory distress. Abdominal: Soft.  There is no tenderness. Muscular skeletal: no synovitis, trigger point positive  Psychiatric: Patient has a normal mood and affect. behavior is normal. Judgment and thought content normal.   PHQ2/9:    09/20/2021    9:23 AM 04/12/2021    3:48 PM 07/18/2020    1:32 PM 07/19/2019    2:37 PM 06/18/2019    1:36 PM  Depression screen PHQ 2/9  Decreased Interest 0 0 1 0 2  Down, Depressed, Hopeless 0 0 0 0 2  PHQ - 2 Score 0 0 1 0 4  Altered sleeping 0 2 3 0 2  Tired, decreased energy 0 3 3 0 3  Change in appetite 0 '3 3 1 3  '$ Feeling bad or failure about  yourself  0 3 0 0 2  Trouble concentrating 0 0 0 0 2  Moving slowly or fidgety/restless 0 1 0 0 2  Suicidal thoughts 0 0 0 0 0  PHQ-9 Score 0 '12 10 1 18  '$ Difficult doing work/chores  Not difficult at all Somewhat difficult Not difficult at all Very difficult    phq 9 is negative   Fall Risk:    09/20/2021    9:23 AM 04/12/2021    3:47 PM 07/18/2020    1:31 PM 07/19/2019    2:37 PM 06/18/2019    1:36 PM  Fall Risk   Falls in the past year? 1 1 0 0   Number falls in past yr: 0 0 0 0 0  Injury with Fall? 0 0 0  0 0  Risk for fall due to : No Fall Risks      Follow up Falls prevention discussed         Assessment & Plan  1. Benign essential HTN  - triamterene-hydrochlorothiazide (MAXZIDE-25) 37.5-25 MG tablet; Take 1 tablet by mouth daily.  Dispense: 90 tablet; Refill: 1  2. Colon cancer screening  - Ambulatory referral to Gastroenterology  3. Morbid obesity (HCC)  - Liraglutide -Weight Management (SAXENDA) 18 MG/3ML SOPN; Inject 0.6-3 mg into the skin daily. Go up on dose by 0.6 mg every month  Dispense: 15 mL; Refill: 0 - Insulin Pen Needle 32G X 6 MM MISC; 1 each by Does not apply route daily at 12 noon.  Dispense: 100 each; Refill: 1  4. Major depression, recurrent, chronic (HCC)  - DULoxetine (CYMBALTA) 30 MG capsule; Take 1 capsule (30 mg total) by mouth daily. Take 60 mg dose when done  Dispense: 30 capsule; Refill: 0 - DULoxetine (CYMBALTA) 60 MG capsule; Take 1 capsule (60 mg total) by mouth daily.  Dispense: 90 capsule; Refill: 0  5. Dysmetabolic syndrome   6. Vitamin D deficiency   7. Fibromyalgia  - DULoxetine (CYMBALTA) 60 MG capsule; Take 1 capsule (60 mg total) by mouth daily.  Dispense: 90 capsule; Refill: 0

## 2022-05-13 ENCOUNTER — Encounter: Payer: Self-pay | Admitting: Family Medicine

## 2022-05-13 ENCOUNTER — Ambulatory Visit (INDEPENDENT_AMBULATORY_CARE_PROVIDER_SITE_OTHER): Payer: BC Managed Care – PPO | Admitting: Family Medicine

## 2022-05-13 VITALS — BP 122/80 | HR 74 | Temp 98.3°F | Resp 14 | Ht 61.0 in | Wt 237.8 lb

## 2022-05-13 DIAGNOSIS — Z23 Encounter for immunization: Secondary | ICD-10-CM

## 2022-05-13 DIAGNOSIS — F339 Major depressive disorder, recurrent, unspecified: Secondary | ICD-10-CM | POA: Diagnosis not present

## 2022-05-13 DIAGNOSIS — E559 Vitamin D deficiency, unspecified: Secondary | ICD-10-CM

## 2022-05-13 DIAGNOSIS — Z1211 Encounter for screening for malignant neoplasm of colon: Secondary | ICD-10-CM

## 2022-05-13 DIAGNOSIS — I10 Essential (primary) hypertension: Secondary | ICD-10-CM

## 2022-05-13 DIAGNOSIS — E8881 Metabolic syndrome: Secondary | ICD-10-CM

## 2022-05-13 DIAGNOSIS — M797 Fibromyalgia: Secondary | ICD-10-CM

## 2022-05-13 MED ORDER — TRIAMTERENE-HCTZ 37.5-25 MG PO TABS: 1.0000 | ORAL_TABLET | Freq: Every day | ORAL | 1 refills | Status: DC

## 2022-05-13 MED ORDER — INSULIN PEN NEEDLE 32G X 6 MM MISC: 1.0000 | Freq: Every day | 1 refills | Status: DC

## 2022-05-13 MED ORDER — DULOXETINE HCL 60 MG PO CPEP: 60.0000 mg | ORAL_CAPSULE | Freq: Every day | ORAL | 0 refills | Status: DC

## 2022-05-13 MED ORDER — SAXENDA 18 MG/3ML ~~LOC~~ SOPN: 0.6000 mg | PEN_INJECTOR | Freq: Every day | SUBCUTANEOUS | 0 refills | Status: DC

## 2022-05-13 MED ORDER — DULOXETINE HCL 30 MG PO CPEP: 30.0000 mg | ORAL_CAPSULE | Freq: Every day | ORAL | 0 refills | Status: DC

## 2022-05-13 NOTE — Addendum Note (Signed)
Addended by: Steele Sizer F on: 05/13/2022 01:57 PM   Modules accepted: Orders

## 2022-05-13 NOTE — Patient Instructions (Signed)
Myofascial Pain Syndrome and Fibromyalgia ?Myofascial pain syndrome and fibromyalgia are both pain disorders. You may feel this pain mainly in your muscles. ?Myofascial pain syndrome: ?Always has tender points in the muscles that will cause pain when pressed (trigger points). The pain may come and go. ?Usually affects your neck, upper back, and shoulder areas. The pain often moves into your arms and hands. ?Fibromyalgia: ?Has muscle pains and tenderness that come and go. ?Is often associated with tiredness (fatigue) and sleep problems. ?Has trigger points. ?Tends to be long-lasting (chronic), but is not life-threatening. ?Fibromyalgia and myofascial pain syndrome are not the same. However, they often occur together. If you have both conditions, each can make the other worse. Both are common and can cause enough pain and fatigue to make day-to-day activities difficult. Both can be hard to diagnose because their symptoms are common in many other conditions. ?What are the causes? ?The exact causes of these conditions are not known. ?What increases the risk? ?You are more likely to develop either of these conditions if: ?You have a family history of the condition. ?You are female. ?You have certain triggers, such as: ?Spine disorders. ?An injury (trauma) or other physical stressors. ?Being under a lot of stress. ?Medical conditions such as osteoarthritis, rheumatoid arthritis, or lupus. ?What are the signs or symptoms? ?Fibromyalgia ?The main symptom of fibromyalgia is widespread pain and tenderness in your muscles. Pain is sometimes described as stabbing, shooting, or burning. ?You may also have: ?Tingling or numbness. ?Sleep problems and fatigue. ?Problems with attention and concentration (fibro fog). ?Other symptoms may include: ?Bowel and bladder problems. ?Headaches. ?Vision problems. ?Sensitivity to odors and noises. ?Depression or mood changes. ?Painful menstrual periods (dysmenorrhea). ?Dry skin or eyes. ?These  symptoms can vary over time. ?Myofascial pain syndrome ?Symptoms of myofascial pain syndrome include: ?Tight, ropy bands of muscle. ?Uncomfortable sensations in muscle areas. These may include aching, cramping, burning, numbness, tingling, and weakness. ?Difficulty moving certain parts of the body freely (poor range of motion). ?How is this diagnosed? ?This condition may be diagnosed by your symptoms and medical history. You will also have a physical exam. In general: ?Fibromyalgia is diagnosed if you have pain, fatigue, and other symptoms for more than 3 months, and symptoms cannot be explained by another condition. ?Myofascial pain syndrome is diagnosed if you have trigger points in your muscles, and those trigger points are tender and cause pain elsewhere in your body (referred pain). ?How is this treated? ?Treatment for these conditions depends on the type that you have. ?For fibromyalgia a healthy lifestyle is the most important treatment including aerobic and strength exercises. Different types of medicines are used to help treat pain and include: ?NSAIDs. ?Medicines for treating depression. ?Medicines that help control seizures. ?Medicines that relax the muscles. ?Treatment for myofascial pain syndrome includes: ?Pain medicines, such as NSAIDs. ?Cooling and stretching of muscles. ?Massage therapy with myofascial release technique. ?Trigger point injections. ?Treating these conditions often requires a team of health care providers. These may include: ?Your primary care provider. ?A physical therapist. ?Complementary health care providers, such as massage therapists or acupuncturists. ?A psychiatrist for cognitive behavioral therapy. ?Follow these instructions at home: ?Medicines ?Take over-the-counter and prescription medicines only as told by your health care provider. ?Ask your health care provider if the medicine prescribed to you: ?Requires you to avoid driving or using machinery. ?Can cause constipation.  You may need to take these actions to prevent or treat constipation: ?Drink enough fluid to keep your urine pale   yellow. ?Take over-the-counter or prescription medicines. ?Eat foods that are high in fiber, such as beans, whole grains, and fresh fruits and vegetables. ?Limit foods that are high in fat and processed sugars, such as fried or sweet foods. ?Lifestyle ? ?Do exercises as told by your health care provider or physical therapist. ?Practice relaxation techniques to control your stress. You may want to try: ?Biofeedback. ?Visual imagery. ?Hypnosis. ?Muscle relaxation. ?Yoga. ?Meditation. ?Maintain a healthy lifestyle. This includes eating a healthy diet and getting enough sleep. ?Do not use any products that contain nicotine or tobacco. These products include cigarettes, chewing tobacco, and vaping devices, such as e-cigarettes. If you need help quitting, ask your health care provider. ?General instructions ?Talk to your health care provider about complementary treatments, such as acupuncture or massage. ?Do not do activities that stress or strain your muscles. This includes repetitive motions and heavy lifting. ?Keep all follow-up visits. This is important. ?Where to find support ?Consider joining a support group with others who are diagnosed with this condition. ?National Fibromyalgia Association: www.fmaware.org ?Where to find more information ?American Chronic Pain Association: www.theacpa.org ?Contact a health care provider if: ?You have new symptoms. ?Your symptoms get worse or your pain is severe. ?You have side effects from your medicines. ?You have trouble sleeping. ?Your condition is causing depression or anxiety. ?Get help right away if: ?You have thoughts of hurting yourself or others. ?Get help right awayif you feel like you may hurt yourself or others, or have thoughts about taking your own life. Go to your nearest emergency room or: ?Call 911. ?Call the National Suicide Prevention Lifeline at  1-800-273-8255 or 988. This is open 24 hours a day. ?Text the Crisis Text Line at 741741. ?Summary ?Myofascial pain syndrome and fibromyalgia are pain disorders. ?Myofascial pain syndrome has tender points in the muscles that will cause pain when pressed (trigger points). Fibromyalgia also has muscle pains and tenderness that come and go, but this condition is often associated with fatigue and sleep disturbances. ?Fibromyalgia and myofascial pain syndrome are not the same but often occur together, causing pain and fatigue that make day-to-day activities difficult. ?Follow your health care provider's instructions for taking medicines and maintaining a healthy lifestyle. ?This information is not intended to replace advice given to you by your health care provider. Make sure you discuss any questions you have with your health care provider. ?Document Revised: 08/10/2021 Document Reviewed: 08/10/2021 ?Elsevier Patient Education ? 2023 [employer] ? ?

## 2022-05-14 ENCOUNTER — Telehealth: Payer: Self-pay

## 2022-05-14 ENCOUNTER — Other Ambulatory Visit: Payer: Self-pay

## 2022-05-14 DIAGNOSIS — Z1211 Encounter for screening for malignant neoplasm of colon: Secondary | ICD-10-CM

## 2022-05-14 MED ORDER — NA SULFATE-K SULFATE-MG SULF 17.5-3.13-1.6 GM/177ML PO SOLN: 354.0000 mL | Freq: Once | ORAL | 0 refills | Status: AC

## 2022-05-14 NOTE — Telephone Encounter (Signed)
Gastroenterology Pre-Procedure Review  Request Date: 06/05/2022 Requesting Physician: Dr. Marius Ditch   PATIENT REVIEW QUESTIONS: The patient responded to the following health history questions as indicated:    1. Are you having any GI issues? no 2. Do you have a personal history of Polyps? no 3. Do you have a family history of Colon Cancer or Polyps? no 4. Diabetes Mellitus? no 5. Joint replacements in the past 12 months?no 6. Major health problems in the past 3 months?no 7. Any artificial heart valves, MVP, or defibrillator?no    MEDICATIONS & ALLERGIES:    Patient reports the following regarding taking any anticoagulation/antiplatelet therapy:   Plavix, Coumadin, Eliquis, Xarelto, Lovenox, Pradaxa, Brilinta, or Effient? no Aspirin? no  Patient confirms/reports the following medications:  Current Outpatient Medications  Medication Sig Dispense Refill   cetirizine (ZYRTEC ALLERGY) 10 MG tablet Take 1 tablet (10 mg total) by mouth daily. (Patient not taking: Reported on 04/12/2021) 30 tablet 0   diclofenac (VOLTAREN) 75 MG EC tablet TAKE 1 TABLET BY MOUTH TWICE A DAY 60 tablet 0   DULoxetine (CYMBALTA) 30 MG capsule Take 1 capsule (30 mg total) by mouth daily. Take 60 mg dose when done 30 capsule 0   DULoxetine (CYMBALTA) 60 MG capsule Take 1 capsule (60 mg total) by mouth daily. 90 capsule 0   fluticasone (FLONASE) 50 MCG/ACT nasal spray Place 2 sprays into both nostrils daily. 16 g 0   Insulin Pen Needle 32G X 6 MM MISC 1 each by Does not apply route daily at 12 noon. 100 each 1   Liraglutide -Weight Management (SAXENDA) 18 MG/3ML SOPN Inject 0.6-3 mg into the skin daily. Go up on dose by 0.6 mg every month 15 mL 0   triamterene-hydrochlorothiazide (MAXZIDE-25) 37.5-25 MG tablet Take 1 tablet by mouth daily. 90 tablet 1   No current facility-administered medications for this visit.    Patient confirms/reports the following allergies:  Allergies  Allergen Reactions   Ace Inhibitors  Other (See Comments)    Cough. Pt states specifically catopril    No orders of the defined types were placed in this encounter.   AUTHORIZATION INFORMATION Primary Insurance: 1D#: Group #:  Secondary Insurance: 1D#: Group #:  SCHEDULE INFORMATION: Date:  Time: Location:

## 2022-05-21 ENCOUNTER — Telehealth: Payer: Self-pay | Admitting: Family Medicine

## 2022-05-21 NOTE — Telephone Encounter (Signed)
Medication Refill - Medication: Liraglutide -Weight Management (SAXENDA) 18 MG/3ML SOPN Med isnt in stock, patient looking for another pharmacy to get it from Has the patient contacted their pharmacy? yes (Agent: If no, request that the patient contact the pharmacy for the refill. If patient does not wish to contact the pharmacy document the reason why and proceed with request.) (Agent: If yes, when and what did the pharmacy advise?)contact pcp  Preferred Pharmacy (with phone number or street name): ? Has the patient been seen for an appointment in the last year OR does the patient have an upcoming appointment? yes  Agent: Please be advised that RX refills may take up to 3 business days. We ask that you follow-up with your pharmacy.

## 2022-05-21 NOTE — Telephone Encounter (Signed)
Called patient and let her know she'll need to call around to different pharmacies to see who has it in stock. Once she can find a pharmacy we will send rx there. Patient gave verbal understanding.

## 2022-06-04 ENCOUNTER — Encounter: Payer: Self-pay | Admitting: Gastroenterology

## 2022-06-04 ENCOUNTER — Other Ambulatory Visit: Payer: Self-pay | Admitting: Family Medicine

## 2022-06-04 DIAGNOSIS — F339 Major depressive disorder, recurrent, unspecified: Secondary | ICD-10-CM

## 2022-06-05 ENCOUNTER — Ambulatory Visit: Payer: BC Managed Care – PPO | Admitting: Anesthesiology

## 2022-06-05 ENCOUNTER — Other Ambulatory Visit: Payer: Self-pay

## 2022-06-05 ENCOUNTER — Encounter: Payer: Self-pay | Admitting: Gastroenterology

## 2022-06-05 ENCOUNTER — Ambulatory Visit
Admission: RE | Admit: 2022-06-05 | Discharge: 2022-06-05 | Disposition: A | Discharge status: Home / Self Care | Payer: BC Managed Care – PPO | Attending: Gastroenterology | Admitting: Gastroenterology

## 2022-06-05 ENCOUNTER — Encounter
Admission: RE | Disposition: A | Discharge status: Home / Self Care | Payer: Self-pay | Source: Home / Self Care | Attending: Gastroenterology

## 2022-06-05 DIAGNOSIS — I10 Essential (primary) hypertension: Secondary | ICD-10-CM | POA: Insufficient documentation

## 2022-06-05 DIAGNOSIS — K219 Gastro-esophageal reflux disease without esophagitis: Secondary | ICD-10-CM | POA: Diagnosis not present

## 2022-06-05 DIAGNOSIS — F418 Other specified anxiety disorders: Secondary | ICD-10-CM | POA: Diagnosis not present

## 2022-06-05 DIAGNOSIS — D123 Benign neoplasm of transverse colon: Secondary | ICD-10-CM | POA: Insufficient documentation

## 2022-06-05 DIAGNOSIS — K635 Polyp of colon: Secondary | ICD-10-CM | POA: Diagnosis not present

## 2022-06-05 DIAGNOSIS — D122 Benign neoplasm of ascending colon: Secondary | ICD-10-CM | POA: Insufficient documentation

## 2022-06-05 DIAGNOSIS — F32A Depression, unspecified: Secondary | ICD-10-CM | POA: Diagnosis not present

## 2022-06-05 DIAGNOSIS — E8881 Metabolic syndrome: Secondary | ICD-10-CM

## 2022-06-05 DIAGNOSIS — Z6839 Body mass index (BMI) 39.0-39.9, adult: Secondary | ICD-10-CM | POA: Diagnosis not present

## 2022-06-05 DIAGNOSIS — Z1211 Encounter for screening for malignant neoplasm of colon: Secondary | ICD-10-CM | POA: Insufficient documentation

## 2022-06-05 DIAGNOSIS — F419 Anxiety disorder, unspecified: Secondary | ICD-10-CM | POA: Insufficient documentation

## 2022-06-05 HISTORY — PX: COLONOSCOPY WITH PROPOFOL: SHX5780

## 2022-06-05 SURGERY — COLONOSCOPY WITH PROPOFOL
Anesthesia: General

## 2022-06-05 MED ORDER — PROPOFOL 10 MG/ML IV BOLUS
INTRAVENOUS | Status: DC | PRN
  Administered 2022-06-05: 80 mg via INTRAVENOUS

## 2022-06-05 MED ORDER — SODIUM CHLORIDE 0.9 % IV SOLN: INTRAVENOUS | Status: DC

## 2022-06-05 MED ORDER — LIDOCAINE HCL (CARDIAC) PF 100 MG/5ML IV SOSY
PREFILLED_SYRINGE | INTRAVENOUS | Status: DC | PRN
  Administered 2022-06-05: 50 mg via INTRAVENOUS

## 2022-06-05 MED ORDER — PROPOFOL 500 MG/50ML IV EMUL
INTRAVENOUS | Status: DC | PRN
  Administered 2022-06-05: 165 ug/kg/min via INTRAVENOUS

## 2022-06-05 NOTE — Transfer of Care (Signed)
Immediate Anesthesia Transfer of Care Note  Patient: Cheyenne Walker  Procedure(s) Performed: COLONOSCOPY WITH PROPOFOL  Patient Location: Endoscopy Unit  Anesthesia Type:General  Level of Consciousness: awake and patient cooperative  Airway & Oxygen Therapy: Patient Spontanous Breathing  Post-op Assessment: Report given to RN and Post -op Vital signs reviewed and stable  Post vital signs: Reviewed and stable  Last Vitals:  Vitals Value Taken Time  BP    Temp    Pulse    Resp    SpO2      Last Pain:  Vitals:   06/05/22 1035  TempSrc:   PainSc: Asleep         Complications: No notable events documented.
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
## 2022-06-05 NOTE — Op Note (Signed)
[facility] Gastroenterology Patient Name: Cheyenne Walker Procedure Date: 06/05/2022 10:00 AM MRN: [id number] Account #: [id number] Date of Birth: [birth year] Admit Type: Outpatient Age: 51 Room: [facility] ENDO ROOM 4 Gender: Female Note Status: Finalized Instrument Name: Jasper Riling [id number] Procedure:             Colonoscopy Indications:           Screening for colorectal malignant neoplasm, This is                         the patient's first colonoscopy Providers:             Lin Landsman MD, MD Referring MD:          Bethena Roys. [name], MD (Referring MD) Medicines:             General Anesthesia Complications:         No immediate complications. Estimated blood loss: None. Procedure:             Pre-Anesthesia Assessment:                        - Prior to the procedure, a History and Physical was                         performed, and patient medications and allergies were                         reviewed. The patient is competent. The risks and                         benefits of the procedure and the sedation options and                         risks were discussed with the patient. All questions                         were answered and informed consent was obtained.                         Patient identification and proposed procedure were                         verified by the physician, the nurse, the                         anesthesiologist, the anesthetist and the technician                         in the pre-procedure area in the procedure room in the                         endoscopy suite. Mental Status Examination: alert and                         oriented. Airway Examination: normal oropharyngeal                         airway and neck mobility. Respiratory Examination:  clear to auscultation. CV Examination: normal.                         Prophylactic Antibiotics: The patient does not require                          prophylactic antibiotics. Prior Anticoagulants: The                         patient has taken no previous anticoagulant or                         antiplatelet agents. ASA Grade Assessment: III - A                         patient with severe systemic disease. After reviewing                         the risks and benefits, the patient was deemed in                         satisfactory condition to undergo the procedure. The                         anesthesia plan was to use general anesthesia.                         Immediately prior to administration of medications,                         the patient was re-assessed for adequacy to receive                         sedatives. The heart rate, respiratory rate, oxygen                         saturations, blood pressure, adequacy of pulmonary                         ventilation, and response to care were monitored                         throughout the procedure. The physical status of the                         patient was re-assessed after the procedure.                        After obtaining informed consent, the colonoscope was                         passed under direct vision. Throughout the procedure,                         the patient's blood pressure, pulse, and oxygen                         saturations were monitored continuously. The  Colonoscope was introduced through the anus and                         advanced to the the cecum, identified by appendiceal                         orifice and ileocecal valve. The colonoscopy was                         performed without difficulty. The patient tolerated                         the procedure well. The quality of the bowel                         preparation was evaluated using the BBPS [facility] Bowel                         Preparation Scale) with scores of: Right Colon = 3,                         Transverse Colon = 3 and Left Colon = 3 (entire mucosa                          seen well with no residual staining, small fragments                         of stool or opaque liquid). The total BBPS score                         equals 9. Findings:      The perianal and digital rectal examinations were normal. Pertinent       negatives include normal sphincter tone and no palpable rectal lesions.      Two sessile polyps were found in the transverse colon and ascending       colon. The polyps were 5 to 6 mm in size. These polyps were removed with       a cold snare. Resection and retrieval were complete. Estimated blood       loss: none.      The retroflexed view of the distal rectum and anal verge was normal and       showed no anal or rectal abnormalities. Impression:            - Two 5 to 6 mm polyps in the transverse colon and in                         the ascending colon, removed with a cold snare.                         Resected and retrieved.                        - The distal rectum and anal verge are normal on                         retroflexion view. Recommendation:        - Discharge patient  to home (with escort).                        - Resume previous diet today.                        - Continue present medications.                        - Await pathology results.                        - Repeat colonoscopy in 5 years for surveillance based                         on pathology results. Procedure Code(s):     --- Professional ---                        [phone], Colonoscopy, flexible; with removal of                         tumor(s), polyp(s), or other lesion(s) by snare                         technique Diagnosis Code(s):     --- Professional ---                        Z12.11, Encounter for screening for malignant neoplasm                         of colon                        K63.5, Polyp of colon CPT copyright 2019 American Medical Association. All rights reserved. The codes documented in this report are preliminary and upon  coder review may  be revised to meet current compliance requirements. Dr. Ulyess Mort Lin Landsman MD, MD 06/05/2022 10:33:11 AM This report has been signed electronically. Number of Addenda: 0 Note Initiated On: 06/05/2022 10:00 AM Scope Withdrawal Time: 0 hours 12 minutes 41 seconds  Total Procedure Duration: 0 hours 17 minutes 12 seconds  Estimated Blood Loss:  Estimated blood loss: none.      [facility]

## 2022-06-05 NOTE — Anesthesia Procedure Notes (Signed)
Date/Time: 06/05/2022 10:10 AM  Performed by: Loletha Grayer, CRNAPre-anesthesia Checklist: Patient identified, Emergency Drugs available, Suction available, Patient being monitored and Timeout performed Patient Re-evaluated:Patient Re-evaluated prior to induction Oxygen Delivery Method: Simple face mask

## 2022-06-05 NOTE — Anesthesia Postprocedure Evaluation (Signed)
Anesthesia Post Note  Patient: Cheyenne Walker  Procedure(s) Performed: COLONOSCOPY WITH PROPOFOL  Patient location during evaluation: Endoscopy Anesthesia Type: General Level of consciousness: awake and alert Pain management: pain level controlled Vital Signs Assessment: post-procedure vital signs reviewed and stable Respiratory status: spontaneous breathing, nonlabored ventilation, respiratory function stable and patient connected to nasal cannula oxygen Cardiovascular status: blood pressure returned to baseline and stable Postop Assessment: no apparent nausea or vomiting Anesthetic complications: no   No notable events documented.   Last Vitals:  Vitals:   06/05/22 1046 06/05/22 1056  BP: 114/80 (!) 142/80  Pulse: (!) 52 (!) 47  Resp: 13 20  Temp:    SpO2: 99% 100%    Last Pain:  Vitals:   06/05/22 1056  TempSrc:   PainSc: 0-No pain                 Ilene Qua

## 2022-06-05 NOTE — Anesthesia Preprocedure Evaluation (Addendum)
Anesthesia Evaluation  Patient identified by MRN, date of birth, ID band Patient awake    Reviewed: Allergy & Precautions, NPO status , Patient's Chart, lab work & pertinent test results  History of Anesthesia Complications Negative for: history of anesthetic complications  Airway Mallampati: II  TM Distance: >3 FB Neck ROM: Full    Dental no notable dental hx.    Pulmonary neg pulmonary ROS,    Pulmonary exam normal breath sounds clear to auscultation       Cardiovascular Exercise Tolerance: Good hypertension, negative cardio ROS Normal cardiovascular exam Rhythm:Regular Rate:Normal     Neuro/Psych PSYCHIATRIC DISORDERS Anxiety Depression negative neurological ROS     GI/Hepatic Neg liver ROS, GERD  ,  Endo/Other  Morbid obesityHas not yet started Saxenda   Renal/GU negative Renal ROS  negative genitourinary   Musculoskeletal negative musculoskeletal ROS (+) DDD, chronic back pain     Abdominal   Peds negative pediatric ROS (+)  Hematology negative hematology ROS (+)   Anesthesia Other Findings   Reproductive/Obstetrics negative OB ROS                            Anesthesia Physical Anesthesia Plan  ASA: 3  Anesthesia Plan: General   Post-op Pain Management: Minimal or no pain anticipated   Induction: Intravenous  PONV Risk Score and Plan: 2 and Propofol infusion  Airway Management Planned: Natural Airway and Nasal Cannula  Additional Equipment:   Intra-op Plan:   Post-operative Plan:   Informed Consent: I have reviewed the patients History and Physical, chart, labs and discussed the procedure including the risks, benefits and alternatives for the proposed anesthesia with the patient or authorized representative who has indicated his/her understanding and acceptance.     Dental Advisory Given  Plan Discussed with: Anesthesiologist, CRNA and Surgeon  Anesthesia Plan  Comments: (Patient consented for risks of anesthesia including but not limited to:  - adverse reactions to medications - risk of airway placement if required - damage to eyes, teeth, lips or other oral mucosa - nerve damage due to positioning  - sore throat or hoarseness - Damage to heart, brain, nerves, lungs, other parts of body or loss of life  Patient voiced understanding.)        Anesthesia Quick Evaluation

## 2022-06-06 LAB — SURGICAL PATHOLOGY

## 2022-06-07 ENCOUNTER — Encounter: Payer: Self-pay | Admitting: Gastroenterology

## 2022-06-26 ENCOUNTER — Ambulatory Visit
Admission: EM | Admit: 2022-06-26 | Discharge: 2022-06-26 | Disposition: A | Discharge status: Home / Self Care | Payer: BC Managed Care – PPO | Attending: Urgent Care | Admitting: Urgent Care

## 2022-06-26 DIAGNOSIS — R6889 Other general symptoms and signs: Secondary | ICD-10-CM

## 2022-06-26 DIAGNOSIS — J019 Acute sinusitis, unspecified: Secondary | ICD-10-CM | POA: Diagnosis not present

## 2022-06-26 DIAGNOSIS — R051 Acute cough: Secondary | ICD-10-CM

## 2022-06-26 DIAGNOSIS — U071 COVID-19: Secondary | ICD-10-CM | POA: Insufficient documentation

## 2022-06-26 DIAGNOSIS — J209 Acute bronchitis, unspecified: Secondary | ICD-10-CM | POA: Diagnosis not present

## 2022-06-26 DIAGNOSIS — B999 Unspecified infectious disease: Secondary | ICD-10-CM | POA: Insufficient documentation

## 2022-06-26 LAB — RESP PANEL BY RT-PCR (RSV, FLU A&B, COVID)  RVPGX2
Influenza A by PCR: NEGATIVE
Influenza B by PCR: NEGATIVE
Resp Syncytial Virus by PCR: NEGATIVE
SARS Coronavirus 2 by RT PCR: POSITIVE — AB

## 2022-06-26 MED ORDER — HYDROCOD POLI-CHLORPHE POLI ER 10-8 MG/5ML PO SUER: 5.0000 mL | Freq: Every evening | ORAL | 0 refills | Status: AC | PRN

## 2022-06-26 MED ORDER — METHYLPREDNISOLONE 4 MG PO TBPK: ORAL_TABLET | ORAL | 0 refills | Status: DC

## 2022-06-26 NOTE — ED Triage Notes (Signed)
Pt. Is c/o a headache and chest discomfort for the past 3 days. Pt. States she has been feeling like she has been congested and states it could be the feeling that she feels in her chest. Pt. Has been treating herself w/ OTC medication w/ no relief.

## 2022-06-26 NOTE — Discharge Instructions (Addendum)
Follow up here or with your primary care provider if your symptoms are worsening or not improving with treatment.     

## 2022-06-26 NOTE — ED Provider Notes (Signed)
[facility]    CSN: [id number] Arrival date & time: 06/26/22  0945      History   Chief Complaint Chief Complaint  Patient presents with   Headache   Chest Pain    HPI Cheyenne Walker is a 51 y.o. female.    Headache Chest Pain Associated symptoms: headache     Patient presents to urgent care with report of symptoms x3 days including headache and chest discomfort starting today.  She states symptoms started with nasal congestion that she assumed was "just a cold".  States she awoke and this morning with pain in center chest, worse when she breathes along with increased frontal headache behind her eyes.  She endorses productive cough that is worse at night.  Unrelieved by OTC medications.  Denies fever, myalgia, GI symptoms, chills.  Past Medical History:  Diagnosis Date   ADD (attention deficit disorder)    Hypertension    Vitamin D deficiency     Patient Active Problem List   Diagnosis Date Noted   Screening for colon cancer    Adenomatous polyp of transverse colon    Adenomatous polyp of ascending colon    Lumbar radiculopathy (L5) 10/08/2018   Chronic pain syndrome 10/08/2018   DDD (degenerative disc disease), lumbosacral 05/01/2017   Anxiety 04/03/2015   Benign essential HTN 04/03/2015   Allergic rhinitis 04/03/2015   Gastro-esophageal reflux disease without esophagitis 04/03/2015   Major depression, recurrent, chronic ([place]) 04/03/2015   Morbid obesity with BMI of 40.0-44.9, adult ([place]) 46/50/3546   Dysmetabolic syndrome 56/81/2751   Numerous moles 04/03/2015   Plantar fasciitis 04/03/2015   PTSD (post-traumatic stress disorder) 04/03/2015   Vitamin D deficiency 70/09/7492   Umbilical hernia without obstruction and without gangrene 04/03/2015    Past Surgical History:  Procedure Laterality Date   CHOLECYSTECTOMY  2006   COLONOSCOPY WITH PROPOFOL N/A 06/05/2022   Procedure: COLONOSCOPY WITH PROPOFOL;  Surgeon: Lin Landsman, MD;   Location: [facility] ENDOSCOPY;  Service: Gastroenterology;  Laterality: N/A;   HERNIA REPAIR  10/2014    OB History     Gravida  5   Para  4   Term  4   Preterm  0   AB  1   Living  4      SAB      IAB      Ectopic      Multiple      Live Births               Home Medications    Prior to Admission medications   Medication Sig Start Date End Date Taking? Authorizing Provider  chlorpheniramine-HYDROcodone (TUSSIONEX) 10-8 MG/5ML Take 5 mLs by mouth at bedtime as needed for up to 5 days for cough. 06/26/22 07/01/22 Yes Louellen Haldeman, Annie Main, FNP  DULoxetine (CYMBALTA) 30 MG capsule Take 1 capsule (30 mg total) by mouth daily. Take 60 mg dose when done 06/05/22   Steele Sizer, MD  methylPREDNISolone (MEDROL DOSEPAK) 4 MG TBPK tablet Steroid taper. Take as directed by packaging. 06/26/22  Yes Robel Wuertz, Annie Main, FNP  fluticasone (FLONASE) 50 MCG/ACT nasal spray Place 2 sprays into both nostrils daily. 03/10/21   Coral Spikes, DO  Insulin Pen Needle 32G X 6 MM MISC 1 each by Does not apply route daily at 12 noon. 05/13/22   Steele Sizer, MD  Na Sulfate-K Sulfate-Mg Sulf 17.5-3.13-1.6 GM/177ML SOLN SMARTSIG:[id number] Milliliter(s) By Mouth Once 05/14/22   [provider]  triamterene-hydrochlorothiazide (MAXZIDE-25) 37.5-25 MG  tablet Take 1 tablet by mouth daily. 05/13/22   Steele Sizer, MD  omeprazole (PRILOSEC) 20 MG capsule Take 1 capsule (20 mg total) by mouth 2 (two) times daily before a meal. 07/25/20 08/30/20  Faustino Congress, NP  pregabalin (LYRICA) 50 MG capsule Take 1-3 capsules (50-150 mg total) by mouth at bedtime as needed. 07/18/20 08/30/20  Steele Sizer, MD    Family History Family History  Problem Relation Age of Onset   Hypertension Mother    Hypertension Sister    Hypertension Brother     Social History Social History   Tobacco Use   Smoking status: Never   Smokeless tobacco: Never  Vaping Use   Vaping Use: Never used  Substance Use  Topics   Alcohol use: No    Alcohol/week: 0.0 standard drinks of alcohol   Drug use: No     Allergies   Ace inhibitors   Review of Systems Review of Systems  Cardiovascular:  Positive for chest pain.  Neurological:  Positive for headaches.     Physical Exam Triage Vital Signs ED Triage Vitals  Enc Vitals Group     BP 06/26/22 1002 (!) 152/98     Pulse Rate 06/26/22 1002 (!) 59     Resp 06/26/22 1002 18     Temp 06/26/22 1002 98.5 F (36.9 C)     Temp Source 06/26/22 1002 Oral     SpO2 06/26/22 1002 98 %     Weight --      Height --      Head Circumference --      Peak Flow --      Pain Score 06/26/22 1009 6     Pain Loc --      Pain Edu? --      Excl. in [place]? --    No data found.  Updated Vital Signs BP (!) 152/98 (BP Location: Left Arm)   Pulse (!) 59   Temp 98.5 F (36.9 C) (Oral)   Resp 18   LMP 10/29/2015   SpO2 98%   Visual Acuity Right Eye Distance:   Left Eye Distance:   Bilateral Distance:    Right Eye Near:   Left Eye Near:    Bilateral Near:     Physical Exam Vitals reviewed.  Constitutional:      Appearance: She is well-developed.  HENT:     Head: Normocephalic and atraumatic.     Mouth/Throat:     Mouth: Mucous membranes are moist.     Pharynx: Posterior oropharyngeal erythema present.  Cardiovascular:     Rate and Rhythm: Normal rate and regular rhythm.  Pulmonary:     Effort: Pulmonary effort is normal.     Breath sounds: Wheezing present.  Musculoskeletal:     Cervical back: Normal range of motion and neck supple.  Lymphadenopathy:     Cervical: No cervical adenopathy.  Skin:    General: Skin is warm and dry.  Neurological:     Mental Status: She is alert and oriented to person, place, and time.  Psychiatric:        Mood and Affect: Mood normal.        Behavior: Behavior normal.      UC Treatments / Results  Labs (all labs ordered are listed, but only abnormal results are displayed) Labs Reviewed  RESP PANEL BY  RT-PCR (RSV, FLU A&B, COVID)  [id number]    EKG   Radiology No results found.  Procedures Procedures (including critical care  time)  Medications Ordered in UC Medications - No data to display  Initial Impression / Assessment and Plan / UC Course  I have reviewed the triage vital signs and the nursing notes.  Pertinent labs & imaging results that were available during my care of the patient were reviewed by me and considered in my medical decision making (see chart for details).   Max inspirational wheezes heard in upper lobes.  Viral etiology is most likely.  Respiratory swab obtained and pending.  Will treat for bronchitis with Medrol taper.  Will treat her cough with Tussionex.   Final Clinical Impressions(s) / UC Diagnoses   Final diagnoses:  Flu-like symptoms  Acute rhinosinusitis  Acute bronchitis, unspecified organism  Acute cough   Discharge Instructions   None    ED Prescriptions     Medication Sig Dispense Auth. Provider   methylPREDNISolone (MEDROL DOSEPAK) 4 MG TBPK tablet Steroid taper. Take as directed by packaging. 21 tablet Ellsie Violette, FNP   chlorpheniramine-HYDROcodone (TUSSIONEX) 10-8 MG/5ML Take 5 mLs by mouth at bedtime as needed for up to 5 days for cough. 25 mL Saran Laviolette, FNP      I have reviewed the PDMP during this encounter.   Rose Phi, [place] 06/26/22 1036

## 2022-08-12 NOTE — Progress Notes (Deleted)
Name: Cheyenne Walker   MRN: [id number]    DOB: 07-Jan-1971   Date:08/12/2022       Progress Note  Subjective  Chief Complaint  Follow Up  HPI  MDD: she was feeling better during her last visit , she is living with her daughter and helps with grandchildren. She worries about her son all the time and feels anxious - he got deployed last week to [place]. She is eating more than usual and gaining weight. She is willing to try Duloxetine  FMS: she states for years she has aches and pains on her muscles. It has been worse over the past year , she states went to a concert 5 days ago and is still feeling tired from walking and standing for the concert. Her whole body aches and feels fatigued.   Stress incontinence: going on for months, when she coughs or sneezes, reminded her of kegel exercises     Morbid obesity: BMI above 40 she has been doing a step challenge for work and had  lost 15 lbs in the last 6 months, but now gained 7 lbs back, states eating all the time. She went to a weight loss clinic but too costly, discussed medication options., she is wiling to try saxenda. Denies personal history of pancreatitis of family history of thyroid cancer    Low HDL: she has been more active, discussed increase in tree nuts ,fruits and vegetables    HTN: bp is at goal, she is compliant with medication Triamterene hcz and denies side effects. She denies chest pain, palpitation or sob.   DDD lumbar spine/Chronic pain: she has been taking diclofenac prn, not daily, but it helps with pain and inflammation. She feels stiff and took muscle relaxer in the past but did not help, she tried gabapentin and Lyrica but both inefective. She has been to the pain clinic and diclofenac is what works the best for her . She states her pain is constant. She states more stiff when she first gets up in am's, it improves when moving but at the end of the day it gets worse again, doing better now that she has been walking on a regular  basis. Pain has been around 3/10  no longer having radiculitis.   Vitamin D deficiency: continue supplementation   Patient Active Problem List   Diagnosis Date Noted   Screening for colon cancer    Adenomatous polyp of transverse colon    Adenomatous polyp of ascending colon    Lumbar radiculopathy (L5) 10/08/2018   Chronic pain syndrome 10/08/2018   DDD (degenerative disc disease), lumbosacral 05/01/2017   Anxiety 04/03/2015   Benign essential HTN 04/03/2015   Allergic rhinitis 04/03/2015   Gastro-esophageal reflux disease without esophagitis 04/03/2015   Major depression, recurrent, chronic ([place]) 04/03/2015   Morbid obesity with BMI of 40.0-44.9, adult ([place]) 27/78/2423   Dysmetabolic syndrome 53/61/4431   Numerous moles 04/03/2015   Plantar fasciitis 04/03/2015   PTSD (post-traumatic stress disorder) 04/03/2015   Vitamin D deficiency 54/00/8676   Umbilical hernia without obstruction and without gangrene 04/03/2015    Past Surgical History:  Procedure Laterality Date   CHOLECYSTECTOMY  2006   COLONOSCOPY WITH PROPOFOL N/A 06/05/2022   Procedure: COLONOSCOPY WITH PROPOFOL;  Surgeon: Lin Landsman, MD;  Location: [facility];  Service: Gastroenterology;  Laterality: N/A;   HERNIA REPAIR  10/2014    Family History  Problem Relation Age of Onset   Hypertension Mother    Hypertension Sister  Hypertension Brother     Social History   Tobacco Use   Smoking status: Never   Smokeless tobacco: Never  Substance Use Topics   Alcohol use: No    Alcohol/week: 0.0 standard drinks of alcohol     Current Outpatient Medications:    DULoxetine (CYMBALTA) 30 MG capsule, Take 1 capsule (30 mg total) by mouth daily. Take 60 mg dose when done, Disp: 90 capsule, Rfl: 0   fluticasone (FLONASE) 50 MCG/ACT nasal spray, Place 2 sprays into both nostrils daily., Disp: 16 g, Rfl: 0   Insulin Pen Needle 32G X 6 MM MISC, 1 each by Does not apply route daily at 12 noon., Disp: 100  each, Rfl: 1   methylPREDNISolone (MEDROL DOSEPAK) 4 MG TBPK tablet, Steroid taper. Take as directed by packaging., Disp: 21 tablet, Rfl: 0   Na Sulfate-K Sulfate-Mg Sulf 17.5-3.13-1.6 GM/177ML SOLN, SMARTSIG:354 Milliliter(s) By Mouth Once, Disp: , Rfl:    triamterene-hydrochlorothiazide (MAXZIDE-25) 37.5-25 MG tablet, Take 1 tablet by mouth daily., Disp: 90 tablet, Rfl: 1  Allergies  Allergen Reactions   Ace Inhibitors Other (See Comments)    Cough. Pt states specifically catopril    I personally reviewed active problem list, medication list, allergies, family history, social history, health maintenance with the patient/caregiver today.   ROS  ***  Objective  There were no vitals filed for this visit.  There is no height or weight on file to calculate BMI.  Physical Exam ***  Recent Results (from the past 2160 hour(s))  Surgical pathology     Status: None   Collection Time: 06/05/22 10:19 AM  Result Value Ref Range   SURGICAL PATHOLOGY      SURGICAL PATHOLOGY CASE: [id number] PATIENT: Cheyenne Walker Surgical Pathology Report     Specimen Submitted: A. Colon polyp, ascending; cold snare B. Colon polyp, transverse; cold snare  Clinical History: Z12.11 screening.  Polyps.      DIAGNOSIS: A.  COLON, ASCENDING, POLYP; COLD SNARE BIOPSIES: - MULTIPLE FRAGMENTS OF TUBULAR ADENOMA. - NO EVIDENCE OF HIGH-GRADE DYSPLASIA OR MALIGNANCY.  B.  COLON, TRANSVERSE, POLYP; COLD SNARE BIOPSIES: - MULTIPLE FRAGMENTS OF TUBULAR ADENOMA. - NO EVIDENCE OF HIGH-GRADE DYSPLASIA OR MALIGNANCY.   GROSS DESCRIPTION: A. Labeled: Ascending colon polyp cold snare Received: Formalin Collection time: 10:19 AM on 06/05/2022 Placed into formalin time: 10:19 AM on 06/05/2022 Tissue fragment(s): 1 Size: 0.8 x 0.3 x 0.1 cm Description: Tan-pink soft tissue fragment Entirely submitted in 1 cassette.  B. Labeled: Transverse colon polyp cold snare Received: Formalin Collection time:  10:24 AM on 06/05/2022 Placed into form alin time: 10:24 AM on 06/05/2022 Tissue fragment(s): Multiple Size: Aggregate, 1 x 0.5 x 0.1 cm Description: White-tan soft tissue fragments Entirely submitted in 1 cassette.  RB 06/05/2022  Final Diagnosis performed by Theodora Blow, MD.   Electronically signed 06/06/2022 3:31:20PM The electronic signature indicates that the named Attending Pathologist has evaluated the specimen Technical component performed at [facility], [address], [place], [place] [address] Lab: [phone] Dir: Rush Farmer, MD, MMM  Professional component performed at [facility], [facility] [place], [place], [place] [address] Lab: [phone] Dir: Kathi Simpers, MD   Resp panel by RT-PCR (RSV, Flu A&B, Covid) Anterior Nasal Swab     Status: Abnormal   Collection Time: 06/26/22 10:27 AM   Specimen: Anterior Nasal Swab  Result Value Ref Range   SARS Coronavirus 2 by RT PCR POSITIVE (A) NEGATIVE    Comment: (NOTE) SARS-CoV-2 target nucleic acids  are DETECTED.  The SARS-CoV-2 RNA is generally detectable in upper respiratory specimens during the acute phase of infection. Positive results are indicative of the presence of the identified virus, but do not rule out bacterial infection or co-infection with other pathogens not detected by the test. Clinical correlation with patient history and other diagnostic information is necessary to determine patient infection status. The expected result is Negative.  Fact Sheet for Patients: EntrepreneurPulse.com.au  Fact Sheet for Healthcare Providers: IncredibleEmployment.be  This test is not yet approved or cleared by the [place] FDA and  has been authorized for detection and/or diagnosis of SARS-CoV-2 by FDA under an Emergency Use Authorization (EUA).  This EUA will remain in effect (meaning this test can be used) for the duration of  the COVID-19 declaration  under Section 564(b)(1) of the A ct, 21 U.S.C. section 360bbb-3(b)(1), unless the authorization is terminated or revoked sooner.     Influenza A by PCR NEGATIVE NEGATIVE   Influenza B by PCR NEGATIVE NEGATIVE    Comment: (NOTE) The Xpert Xpress SARS-CoV-2/FLU/RSV plus assay is intended as an aid in the diagnosis of influenza from Nasopharyngeal swab specimens and should not be used as a sole basis for treatment. Nasal washings and aspirates are unacceptable for Xpert Xpress SARS-CoV-2/FLU/RSV testing.  Fact Sheet for Patients: EntrepreneurPulse.com.au  Fact Sheet for Healthcare Providers: IncredibleEmployment.be  This test is not yet approved or cleared by the [place] FDA and has been authorized for detection and/or diagnosis of SARS-CoV-2 by FDA under an Emergency Use Authorization (EUA). This EUA will remain in effect (meaning this test can be used) for the duration of the COVID-19 declaration under Section 564(b)(1) of the Act, 21 U.S.C. section 360bbb-3(b)(1), unless the authorization is terminated or revoked.     Resp Syncytial Virus by PCR NEGATIVE NEGATIVE    Comment: (NOTE) Fact Sheet for Patients: EntrepreneurPulse.com.au  Fact Sheet for Healthcare Providers: IncredibleEmployment.be  This test is not yet approved or cleared by the [place] FDA and has been authorized for detection and/or diagnosis of SARS-CoV-2 by FDA under an Emergency Use Authorization (EUA). This EUA will remain in effect (meaning this test can be used) for the duration of the COVID-19 declaration under Section 564(b)(1) of the Act, 21 U.S.C. section 360bbb-3(b)(1), unless the authorization is terminated or revoked.  Performed at [facility], [place] [address] [place], [place] [address]     PHQ2/9:    05/13/2022    1:32 PM 09/20/2021    9:23 AM 04/12/2021    3:48 PM 07/18/2020    1:32 PM 07/19/2019     2:37 PM  Depression screen PHQ 2/9  Decreased Interest 2 0 0 1 0  Down, Depressed, Hopeless 2 0 0 0 0  PHQ - 2 Score 4 0 0 1 0  Altered sleeping 3 0 2 3 0  Tired, decreased energy 3 0 3 3 0  Change in appetite 3 0 '3 3 1  '$ Feeling bad or failure about yourself  2 0 3 0 0  Trouble concentrating 0 0 0 0 0  Moving slowly or fidgety/restless 0 0 1 0 0  Suicidal thoughts 0 0 0 0 0  PHQ-9 Score 15 0 '12 10 1  '$ Difficult doing work/chores Not difficult at all  Not difficult at all Somewhat difficult Not difficult at all    phq 9 is {gen pos [id number]}   Fall Risk:    05/13/2022    1:32 PM 09/20/2021  9:23 AM 04/12/2021    3:47 PM 07/18/2020    1:31 PM 07/19/2019    2:37 PM  [place] in the past year? '1 1 1 '$ 0 0  Number falls in past yr: 0 0 0 0 0  Injury with Fall? 0 0 0 0 0  Risk for fall due to : History of fall(s) No Fall Risks     Follow up Falls prevention discussed;Education provided;Falls evaluation completed Falls prevention discussed         Functional Status Survey:      Assessment & Plan  *** There are no diagnoses linked to this encounter.

## 2022-08-13 ENCOUNTER — Ambulatory Visit: Payer: BC Managed Care – PPO | Admitting: Family Medicine

## 2022-08-31 ENCOUNTER — Emergency Department: Payer: BC Managed Care – PPO

## 2022-08-31 ENCOUNTER — Other Ambulatory Visit: Payer: Self-pay

## 2022-08-31 ENCOUNTER — Emergency Department
Admission: EM | Admit: 2022-08-31 | Discharge: 2022-08-31 | Disposition: A | Discharge status: Home / Self Care | Payer: BC Managed Care – PPO | Attending: Emergency Medicine | Admitting: Emergency Medicine

## 2022-08-31 DIAGNOSIS — R079 Chest pain, unspecified: Secondary | ICD-10-CM | POA: Insufficient documentation

## 2022-08-31 DIAGNOSIS — R059 Cough, unspecified: Secondary | ICD-10-CM | POA: Diagnosis not present

## 2022-08-31 DIAGNOSIS — R0602 Shortness of breath: Secondary | ICD-10-CM | POA: Diagnosis not present

## 2022-08-31 DIAGNOSIS — R0789 Other chest pain: Secondary | ICD-10-CM | POA: Diagnosis not present

## 2022-08-31 DIAGNOSIS — R002 Palpitations: Secondary | ICD-10-CM | POA: Diagnosis not present

## 2022-08-31 LAB — CBC
HCT: 41.5 % (ref 36.0–46.0)
Hemoglobin: 13.7 g/dL (ref 12.0–15.0)
MCH: 29.2 pg (ref 26.0–34.0)
MCHC: 33 g/dL (ref 30.0–36.0)
MCV: 88.5 fL (ref 80.0–100.0)
Platelets: 362 10*3/uL (ref 150–400)
RBC: 4.69 MIL/uL (ref 3.87–5.11)
RDW: 14.9 % (ref 11.5–15.5)
WBC: 7.9 10*3/uL (ref 4.0–10.5)
nRBC: 0 % (ref 0.0–0.2)

## 2022-08-31 LAB — COMPREHENSIVE METABOLIC PANEL
ALT: 23 U/L (ref 0–44)
AST: 24 U/L (ref 15–41)
Albumin: 4.2 g/dL (ref 3.5–5.0)
Alkaline Phosphatase: 65 U/L (ref 38–126)
Anion gap: 6 (ref 5–15)
BUN: 20 mg/dL (ref 6–20)
CO2: 26 mmol/L (ref 22–32)
Calcium: 9.2 mg/dL (ref 8.9–10.3)
Chloride: 109 mmol/L (ref 98–111)
Creatinine, Ser: 0.69 mg/dL (ref 0.44–1.00)
GFR, Estimated: >60 mL/min (ref >60)
Glucose, Bld: 103 mg/dL — ABNORMAL HIGH (ref 70–99)
Potassium: 3.4 mmol/L — ABNORMAL LOW (ref 3.5–5.1)
Sodium: 141 mmol/L (ref 135–145)
Total Bilirubin: 0.5 mg/dL (ref 0.3–1.2)
Total Protein: 7.6 g/dL (ref 6.5–8.1)

## 2022-08-31 LAB — TROPONIN I (HIGH SENSITIVITY): Troponin I (High Sensitivity): 3 ng/L (ref <18)

## 2022-08-31 MED ORDER — PANTOPRAZOLE SODIUM 40 MG PO TBEC: 40.0000 mg | DELAYED_RELEASE_TABLET | Freq: Every day | ORAL | 0 refills | Status: DC

## 2022-08-31 NOTE — Discharge Instructions (Signed)
Start taking the antacid first thing in the morning 30 minutes before eating every day  Call your primary doctor to discuss your symptoms and to arrange follow-up for possible cardiac monitoring

## 2022-08-31 NOTE — ED Provider Notes (Signed)
[facility] Provider Note    Event Date/Time   First MD Initiated Contact with Patient 08/31/22 1221     (approximate)   History   Shortness of Breath   HPI  Cheyenne Walker is a 51 y.o. female  here with SOB. Pt states that approx 3-4 days ago she began to have a mild, aching chest discomfort that was burning like in nature. She had some nasal congestion with this as well so thought she was getting a cold. Pain worsened today and is localized in her sternal area, so she presents for evaluation. It has been fairly constant. She does endorse a mildly productive cough worse at night in AMs. No fevers. No leg swelling.      Physical Exam   Triage Vital Signs: ED Triage Vitals  Enc Vitals Group     BP 08/31/22 1050 (!) 202/105     Pulse Rate 08/31/22 1050 67     Resp 08/31/22 1050 18     Temp 08/31/22 1050 98.5 F (36.9 C)     Temp Source 08/31/22 1050 Oral     SpO2 08/31/22 1050 95 %     Weight 08/31/22 1052 248 lb (112.5 kg)     Height 08/31/22 1052 '5\' 4"'$  (1.626 m)     Head Circumference --      Peak Flow --      Pain Score 08/31/22 1051 0     Pain Loc --      Pain Edu? --      Excl. in [place]? --     Most recent vital signs: Vitals:   08/31/22 1050 08/31/22 1337  BP: (!) 202/105 (!) 158/98  Pulse: 67 64  Resp: 18 18  Temp: 98.5 F (36.9 C) 98.3 F (36.8 C)  SpO2: 95% 96%     General: Awake, no distress.  CV:  Good peripheral perfusion. RRR. No murmurs, rubs, gallops. Resp:  Normal effort. Lungs CTAB. Abd:  No distention. Minimal epigastric TTP. Other:  No LE edema or asymmetry.   ED Results / Procedures / Treatments   Labs (all labs ordered are listed, but only abnormal results are displayed) Labs Reviewed  COMPREHENSIVE METABOLIC PANEL - Abnormal; Notable for the following components:      Result Value   Potassium 3.4 (*)    Glucose, Bld 103 (*)    All other components within normal limits  CBC  TROPONIN I (HIGH  SENSITIVITY)  TROPONIN I (HIGH SENSITIVITY)     EKG Normal sinus rhythm, VR 68. PR 198, QRS 116, QTc 414. No acute ST elevations or derpessions. No ischemia or infarct.   RADIOLOGY CXR: Clear   I also independently reviewed and agree with radiologist interpretations.   PROCEDURES:  Critical Care performed: No   MEDICATIONS ORDERED IN ED: Medications - No data to display   IMPRESSION / MDM / [place] / ED COURSE  I reviewed the triage vital signs and the nursing notes.                              Differential diagnosis includes, but is not limited to, GERD/gastritis, ACS, MSK chest pain, anxiety, unlikely PE PTX.  Patient's presentation is most consistent with acute presentation with potential threat to life or bodily function.  51 yo F here with atypical chest discomfort. CBC shows no leukocytosis or anemia. CMP negative. EKG nonischemic and trop neg despite  constant sx >12 hr - doubt ACS. SHe has no tachycardia, tachypnea, is PERC negative - doubt PE. CXR reviewed by me and is clear. She does note some eating-related issues so will treat for possible GERD, refer for outpt follow-up.  FINAL CLINICAL IMPRESSION(S) / ED DIAGNOSES   Final diagnoses:  Chest pain, unspecified type  Palpitations     Rx / DC Orders   ED Discharge Orders          Ordered    pantoprazole (PROTONIX) 40 MG tablet  Daily        08/31/22 1333             Note:  This document was prepared using Dragon voice recognition software and may include unintentional dictation errors.   Duffy Bruce, MD 08/31/22 2240

## 2022-08-31 NOTE — ED Triage Notes (Signed)
Pt states she started feeling bad last week- pt has been [facility] and feeling like her heart is fluttery- pt denies cough, fever, and congestion- pt has a hx of hypertension and has been taking her medicine as prescribed

## 2022-09-03 ENCOUNTER — Other Ambulatory Visit: Payer: Self-pay | Admitting: Family Medicine

## 2022-09-03 DIAGNOSIS — F339 Major depressive disorder, recurrent, unspecified: Secondary | ICD-10-CM

## 2022-09-03 NOTE — Telephone Encounter (Signed)
Lvm to return call to sch appt and to inform prescription has been sent to pharmacy

## 2022-12-11 NOTE — Progress Notes (Deleted)
Name: Cheyenne Walker   MRN: [id number]    DOB: [birth year]   Date:12/11/2022       Progress Note  Subjective  Chief Complaint  Medication Refill  HPI  MDD: she was feeling better during her last visit , she is living with her daughter and helps with grandchildren. She worries about her son all the time and feels anxious - he got deployed last week to [place]. She is eating more than usual and gaining weight. She is willing to try Duloxetine  FMS: she states for years she has aches and pains on her muscles. It has been worse over the past year , she states went to a concert 5 days ago and is still feeling tired from walking and standing for the concert. Her whole body aches and feels fatigued.   Stress incontinence: going on for months, when she coughs or sneezes, reminded her of kegel exercises     Morbid obesity: BMI above 40 she has been doing a step challenge for work and had  lost 15 lbs in the last 6 months, but now gained 7 lbs back, states eating all the time. She went to a weight loss clinic but too costly, discussed medication options., she is wiling to try saxenda. Denies personal history of pancreatitis of family history of thyroid cancer    Low HDL: she has been more active, discussed increase in tree nuts ,fruits and vegetables    HTN: bp is at goal, she is compliant with medication Triamterene hcz and denies side effects. She denies chest pain, palpitation or sob.   DDD lumbar spine/Chronic pain: she has been taking diclofenac prn, not daily, but it helps with pain and inflammation. She feels stiff and took muscle relaxer in the past but did not help, she tried gabapentin and Lyrica but both inefective. She has been to the pain clinic and diclofenac is what works the best for her . She states her pain is constant. She states more stiff when she first gets up in am's, it improves when moving but at the end of the day it gets worse again, doing better now that she has been walking on a  regular basis. Pain has been around 3/10  no longer having radiculitis.   Vitamin D deficiency: continue supplementation   Patient Active Problem List   Diagnosis Date Noted   Screening for colon cancer    Adenomatous polyp of transverse colon    Adenomatous polyp of ascending colon    Lumbar radiculopathy (L5) 10/08/2018   Chronic pain syndrome 10/08/2018   DDD (degenerative disc disease), lumbosacral 05/01/2017   Anxiety 04/03/2015   Benign essential HTN 04/03/2015   Allergic rhinitis 04/03/2015   Gastro-esophageal reflux disease without esophagitis 04/03/2015   Major depression, recurrent, chronic ([place]) 04/03/2015   Morbid obesity with BMI of 40.0-44.9, adult ([place]) Q000111Q   Dysmetabolic syndrome Q000111Q   Numerous moles 04/03/2015   Plantar fasciitis 04/03/2015   PTSD (post-traumatic stress disorder) 04/03/2015   Vitamin D deficiency Q000111Q   Umbilical hernia without obstruction and without gangrene 04/03/2015    Past Surgical History:  Procedure Laterality Date   CHOLECYSTECTOMY  2006   COLONOSCOPY WITH PROPOFOL N/A 06/05/2022   Procedure: COLONOSCOPY WITH PROPOFOL;  Surgeon: Lin Landsman, MD;  Location: [facility] ENDOSCOPY;  Service: Gastroenterology;  Laterality: N/A;   HERNIA REPAIR  10/2014    Family History  Problem Relation Age of Onset   Hypertension Mother    Hypertension Sister  Hypertension Brother     Social History   Tobacco Use   Smoking status: Never   Smokeless tobacco: Never  Substance Use Topics   Alcohol use: No    Alcohol/week: 0.0 standard drinks of alcohol     Current Outpatient Medications:    DULoxetine (CYMBALTA) 60 MG capsule, Take 1 capsule (60 mg total) by mouth daily. Take 60 mg dose when done, Disp: 30 capsule, Rfl: 0   fluticasone (FLONASE) 50 MCG/ACT nasal spray, Place 2 sprays into both nostrils daily., Disp: 16 g, Rfl: 0   Insulin Pen Needle 32G X 6 MM MISC, 1 each by Does not apply route daily at 12 noon.,  Disp: 100 each, Rfl: 1   methylPREDNISolone (MEDROL DOSEPAK) 4 MG TBPK tablet, Steroid taper. Take as directed by packaging., Disp: 21 tablet, Rfl: 0   Na Sulfate-K Sulfate-Mg Sulf 17.5-3.13-1.6 GM/177ML SOLN, [id number] Milliliter(s) By Mouth Once, Disp: , Rfl:    pantoprazole (PROTONIX) 40 MG tablet, Take 1 tablet (40 mg total) by mouth daily. Take first thing in the morning 30 minutes before eating, every day, Disp: 30 tablet, Rfl: 0   triamterene-hydrochlorothiazide (MAXZIDE-25) 37.5-25 MG tablet, Take 1 tablet by mouth daily., Disp: 90 tablet, Rfl: 1  Allergies  Allergen Reactions   Ace Inhibitors Other (See Comments)    Cough. Pt states specifically catopril    I personally reviewed active problem list, medication list, allergies, family history, social history, health maintenance with the patient/caregiver today.   ROS  ***  Objective  There were no vitals filed for this visit.  There is no height or weight on file to calculate BMI.  Physical Exam ***  No results found for this or any previous visit (from the past 2160 hour(s)).   PHQ2/9:    05/13/2022    1:32 PM 09/20/2021    9:23 AM 04/12/2021    3:48 PM 07/18/2020    1:32 PM 07/19/2019    2:37 PM  Depression screen PHQ 2/9  Decreased Interest 2 0 0 1 0  Down, Depressed, Hopeless 2 0 0 0 0  PHQ - 2 Score 4 0 0 1 0  Altered sleeping 3 0 2 3 0  Tired, decreased energy 3 0 3 3 0  Change in appetite 3 0 3 3 1   Feeling bad or failure about yourself  2 0 3 0 0  Trouble concentrating 0 0 0 0 0  Moving slowly or fidgety/restless 0 0 1 0 0  Suicidal thoughts 0 0 0 0 0  PHQ-9 Score 15 0 12 10 1   Difficult doing work/chores Not difficult at all  Not difficult at all Somewhat difficult Not difficult at all    phq 9 is {gen pos [id number]   Fall Risk:    05/13/2022    1:32 PM 09/20/2021    9:23 AM 04/12/2021    3:47 PM 07/18/2020    1:31 PM 07/19/2019    2:37 PM  Fall Risk   Falls in the past year? 1 1 1  0  0  Number falls in past yr: 0 0 0 0 0  Injury with Fall? 0 0 0 0 0  Risk for fall due to : History of fall(s) No Fall Risks     Follow up Falls prevention discussed;Education provided;Falls evaluation completed Falls prevention discussed         Functional Status Survey:      Assessment & Plan  *** There are no diagnoses linked to this encounter.

## 2022-12-12 ENCOUNTER — Ambulatory Visit: Payer: BC Managed Care – PPO | Admitting: Family Medicine

## 2022-12-19 NOTE — Progress Notes (Signed)
Name: Cheyenne Walker   MRN: [id number]    DOB: 08/03/1971   Date:12/20/2022       Progress Note  Subjective  Chief Complaint  Medication Refill  HPI  MDD: she was feeling better during her last visit , she is living with her daughter and helps with grandchildren. She worries about her son that is in [place] and is coming home in May. She states she has difficulty sleeping, seems to fall asleep when is time to get up. She has been taking Duloxetine for the past 6 months but is still depressed, frustrated about her weight. She is willing to add Seroquel for mood and sleep. Discussed possible side effects.   FMS: she states for years she has aches and pains on her muscles. She is now on Duloxetine and it seems to help with her generalized body aches. Right now pain is zero  Spider veins on left lower extremity: it burns when she walks, discussed leg sleeve   Morbid obesity: BMI above 40 , she is still doing step challenge at work, weight is down 5 lbs since last visit. She was given [place] last year but out of stock, she really wants to lose weight, cooks at home, she gets at least 10000 steps per day. She has metabolic syndrome, HTN and DDD. She would benefit from weight loss medication and we will try sending Zepbound to her pharmacy    Low HDL: she has been more active, discussed increase in tree nuts ,fruits and vegetables . We will recheck labs during her CPE   HTN: bp is high now - states did not take medication before she left her house,  she is compliant with medication Triamterene hcz and denies side effects. She denies chest pain, palpitation or sob.   DDD lumbar spine/Chronic pain: she has been taking diclofenac prn, not daily, but it helps with pain and inflammation. She feels stiff and took muscle relaxer in the past but did not help, she tried gabapentin and Lyrica but both inefective. She stopped going to pain clinic, she finished PT, noticing burning on left lower leg while walking  that radiates to her back, she does not want to go back to PT. Advised a few days of nsaid's and resume exercises at home    Vitamin D deficiency: resume supplementation   Patient Active Problem List   Diagnosis Date Noted   Screening for colon cancer    Adenomatous polyp of transverse colon    Adenomatous polyp of ascending colon    Lumbar radiculopathy (L5) 10/08/2018   Chronic pain syndrome 10/08/2018   DDD (degenerative disc disease), lumbosacral 05/01/2017   Anxiety 04/03/2015   Benign essential HTN 04/03/2015   Allergic rhinitis 04/03/2015   Gastro-esophageal reflux disease without esophagitis 04/03/2015   Major depression, recurrent, chronic ([place]) 04/03/2015   Morbid obesity with BMI of 40.0-44.9, adult ([place]) Q000111Q   Dysmetabolic syndrome Q000111Q   Numerous moles 04/03/2015   Plantar fasciitis 04/03/2015   PTSD (post-traumatic stress disorder) 04/03/2015   Vitamin D deficiency Q000111Q   Umbilical hernia without obstruction and without gangrene 04/03/2015    Past Surgical History:  Procedure Laterality Date   CHOLECYSTECTOMY  2006   COLONOSCOPY WITH PROPOFOL N/A 06/05/2022   Procedure: COLONOSCOPY WITH PROPOFOL;  Surgeon: Lin Landsman, MD;  Location: [facility] ENDOSCOPY;  Service: Gastroenterology;  Laterality: N/A;   HERNIA REPAIR  10/2014    Family History  Problem Relation Age of Onset   Hypertension Mother  Hypertension Sister    Hypertension Brother     Social History   Tobacco Use   Smoking status: Never   Smokeless tobacco: Never  Substance Use Topics   Alcohol use: No    Alcohol/week: 0.0 standard drinks of alcohol     Current Outpatient Medications:    DULoxetine (CYMBALTA) 60 MG capsule, Take 1 capsule (60 mg total) by mouth daily. Take 60 mg dose when done, Disp: 30 capsule, Rfl: 0   fluticasone (FLONASE) 50 MCG/ACT nasal spray, Place 2 sprays into both nostrils daily., Disp: 16 g, Rfl: 0   triamterene-hydrochlorothiazide  (MAXZIDE-25) 37.5-25 MG tablet, Take 1 tablet by mouth daily., Disp: 90 tablet, Rfl: 1  Allergies  Allergen Reactions   Ace Inhibitors Other (See Comments)    Cough. Pt states specifically catopril    I personally reviewed active problem list, medication list, allergies, family history, social history, health maintenance with the patient/caregiver today.   ROS  Constitutional: Negative for fever or weight change.  Respiratory: Negative for cough and shortness of breath.   Cardiovascular: Negative for chest pain or palpitations.  Gastrointestinal: Negative for abdominal pain, no bowel changes.  Musculoskeletal: Negative for gait problem or joint swelling.  Skin: Negative for rash.  Neurological: Negative for dizziness or headache.  No other specific complaints in a complete review of systems (except as listed in HPI above).   Objective  Vitals:   12/20/22 0753  BP: (!) 140/88  Pulse: 76  Resp: 16  Temp: 98.1 F (36.7 C)  TempSrc: Oral  SpO2: 96%  Weight: 233 lb 3.2 oz (105.8 kg)  Height: 5\' 1"  (1.549 m)    Body mass index is 44.06 kg/m.  Physical Exam  Constitutional: Patient appears well-developed and well-nourished. Obese  No distress.  HEENT: head atraumatic, normocephalic, pupils equal and reactive to light, neck supple Cardiovascular: Normal rate, regular rhythm and normal heart sounds.  No murmur heard. No BLE edema. Pulmonary/Chest: Effort normal and breath sounds normal. No respiratory distress. Abdominal: Soft.  There is no tenderness. Psychiatric: Patient has a normal mood and affect. behavior is normal. Judgment and thought content normal.    PHQ2/9:    12/20/2022    7:48 AM 05/13/2022    1:32 PM 09/20/2021    9:23 AM 04/12/2021    3:48 PM 07/18/2020    1:32 PM  Depression screen PHQ 2/9  Decreased Interest 2 2 0 0 1  Down, Depressed, Hopeless 2 2 0 0 0  PHQ - 2 Score 4 4 0 0 1  Altered sleeping 2 3 0 2 3  Tired, decreased energy 2 3 0 3 3   Change in appetite 2 3 0 3 3  Feeling bad or failure about yourself  2 2 0 3 0  Trouble concentrating 0 0 0 0 0  Moving slowly or fidgety/restless 0 0 0 1 0  Suicidal thoughts 0 0 0 0 0  PHQ-9 Score 12 15 0 12 10  Difficult doing work/chores Somewhat difficult Not difficult at all  Not difficult at all Somewhat difficult    phq 9 is positive   Fall Risk:    12/20/2022    7:48 AM 05/13/2022    1:32 PM 09/20/2021    9:23 AM 04/12/2021    3:47 PM 07/18/2020    1:31 PM  Fall Risk   Falls in the past year? 0 1 1 1  0  Number falls in past yr: 0 0 0 0 0  Injury with  Fall? 0 0 0 0 0  Risk for fall due to : No Fall Risks History of fall(s) No Fall Risks    Follow up Falls prevention discussed;Education provided;Falls evaluation completed Falls prevention discussed;Education provided;Falls evaluation completed Falls prevention discussed        Functional Status Survey: Is the patient deaf or have difficulty hearing?: No Does the patient have difficulty seeing, even when wearing glasses/contacts?: No Does the patient have difficulty concentrating, remembering, or making decisions?: Yes Does the patient have difficulty walking or climbing stairs?: No Does the patient have difficulty dressing or bathing?: No Does the patient have difficulty doing errands alone such as visiting a doctor's office or shopping?: No    Assessment & Plan  1. Major depression, recurrent, chronic (HCC)  - DULoxetine (CYMBALTA) 60 MG capsule; Take 1 capsule (60 mg total) by mouth daily. Take 60 mg dose when done  Dispense: 30 capsule; Refill: 0  2. Benign essential HTN  - triamterene-hydrochlorothiazide (MAXZIDE-25) 37.5-25 MG tablet; Take 1 tablet by mouth daily.  Dispense: 90 tablet; Refill: 1  3. Morbid obesity ([place])  - tirzepatide (ZEPBOUND) 2.5 MG/0.5ML Pen; Inject 2.5 mg into the skin once a week.  Dispense: 2 mL; Refill: 0 - tirzepatide (ZEPBOUND) 5 MG/0.5ML Pen; Inject 5 mg into the skin once a  week.  Dispense: 2 mL; Refill: 0 - tirzepatide (ZEPBOUND) 7.5 MG/0.5ML Pen; Inject 7.5 mg into the skin once a week.  Dispense: 2 mL; Refill: 0  4. Vitamin D deficiency  Reminded her to take otc vitamin D   5. Dysmetabolic syndrome  Losing weight, walking more   6. Fibromyalgia  On Duloxetine   7. Insomnia due to mental disorder  - QUEtiapine (SEROQUEL) 25 MG tablet; Take 1 tablet (25 mg total) by mouth every evening.  Dispense: 90 tablet; Refill: 0  8. Perennial allergic rhinitis with seasonal variation  - fluticasone (FLONASE) 50 MCG/ACT nasal spray; Place 2 sprays into both nostrils daily.  Dispense: 48 g; Refill: 0

## 2022-12-20 ENCOUNTER — Other Ambulatory Visit: Payer: Self-pay | Admitting: Family Medicine

## 2022-12-20 ENCOUNTER — Ambulatory Visit (INDEPENDENT_AMBULATORY_CARE_PROVIDER_SITE_OTHER): Payer: BC Managed Care – PPO | Admitting: Family Medicine

## 2022-12-20 ENCOUNTER — Encounter: Payer: Self-pay | Admitting: Family Medicine

## 2022-12-20 DIAGNOSIS — M5137 Other intervertebral disc degeneration, lumbosacral region: Secondary | ICD-10-CM

## 2022-12-20 DIAGNOSIS — F339 Major depressive disorder, recurrent, unspecified: Secondary | ICD-10-CM | POA: Diagnosis not present

## 2022-12-20 DIAGNOSIS — I10 Essential (primary) hypertension: Secondary | ICD-10-CM | POA: Diagnosis not present

## 2022-12-20 DIAGNOSIS — J3089 Other allergic rhinitis: Secondary | ICD-10-CM

## 2022-12-20 DIAGNOSIS — J302 Other seasonal allergic rhinitis: Secondary | ICD-10-CM

## 2022-12-20 DIAGNOSIS — F5105 Insomnia due to other mental disorder: Secondary | ICD-10-CM

## 2022-12-20 DIAGNOSIS — E559 Vitamin D deficiency, unspecified: Secondary | ICD-10-CM | POA: Diagnosis not present

## 2022-12-20 DIAGNOSIS — M797 Fibromyalgia: Secondary | ICD-10-CM

## 2022-12-20 DIAGNOSIS — E8881 Metabolic syndrome: Secondary | ICD-10-CM

## 2022-12-20 MED ORDER — ZEPBOUND 5 MG/0.5ML ~~LOC~~ SOAJ: 5.0000 mg | SUBCUTANEOUS | 0 refills | Status: DC

## 2022-12-20 MED ORDER — FLUTICASONE PROPIONATE 50 MCG/ACT NA SUSP: 2.0000 | Freq: Every day | NASAL | 0 refills | Status: DC

## 2022-12-20 MED ORDER — ZEPBOUND 2.5 MG/0.5ML ~~LOC~~ SOAJ: 2.5000 mg | SUBCUTANEOUS | 0 refills | Status: DC

## 2022-12-20 MED ORDER — TRIAMTERENE-HCTZ 37.5-25 MG PO TABS: 1.0000 | ORAL_TABLET | Freq: Every day | ORAL | 1 refills | Status: DC

## 2022-12-20 MED ORDER — ZEPBOUND 7.5 MG/0.5ML ~~LOC~~ SOAJ: 7.5000 mg | SUBCUTANEOUS | 0 refills | Status: DC

## 2022-12-20 MED ORDER — CELECOXIB 100 MG PO CAPS: 100.0000 mg | ORAL_CAPSULE | Freq: Two times a day (BID) | ORAL | 0 refills | Status: DC

## 2022-12-20 MED ORDER — QUETIAPINE FUMARATE 25 MG PO TABS: 25.0000 mg | ORAL_TABLET | Freq: Every evening | ORAL | 0 refills | Status: DC

## 2022-12-20 MED ORDER — DULOXETINE HCL 60 MG PO CPEP: 60.0000 mg | ORAL_CAPSULE | Freq: Every day | ORAL | 0 refills | Status: DC

## 2022-12-21 ENCOUNTER — Other Ambulatory Visit: Payer: Self-pay | Admitting: Family Medicine

## 2022-12-21 DIAGNOSIS — F339 Major depressive disorder, recurrent, unspecified: Secondary | ICD-10-CM

## 2023-01-05 ENCOUNTER — Other Ambulatory Visit: Payer: Self-pay | Admitting: Family Medicine

## 2023-01-05 DIAGNOSIS — M5137 Other intervertebral disc degeneration, lumbosacral region: Secondary | ICD-10-CM

## 2023-01-06 ENCOUNTER — Other Ambulatory Visit: Payer: Self-pay

## 2023-01-17 ENCOUNTER — Other Ambulatory Visit: Payer: Self-pay | Admitting: Family Medicine

## 2023-01-18 ENCOUNTER — Other Ambulatory Visit: Payer: Self-pay | Admitting: Family Medicine

## 2023-01-18 DIAGNOSIS — F5105 Insomnia due to other mental disorder: Secondary | ICD-10-CM
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
## 2023-02-12 NOTE — Patient Instructions (Signed)
Preventive Care 40-50 Years Old, Female Preventive care refers to lifestyle choices and visits with your health care provider that can promote health and wellness. Preventive care visits are also called wellness exams. What can I expect for my preventive care visit? Counseling Your health care provider may ask you questions about your: Medical history, including: Past medical problems. Family medical history. Pregnancy history. Current health, including: Menstrual cycle. Method of birth control. Emotional well-being. Home life and relationship well-being. Sexual activity and sexual health. Lifestyle, including: Alcohol, nicotine or tobacco, and drug use. Access to firearms. Diet, exercise, and sleep habits. Work and work environment. Sunscreen use. Safety issues such as seatbelt and bike helmet use. Physical exam Your health care provider will check your: Height and weight. These may be used to calculate your BMI (body mass index). BMI is a measurement that tells if you are at a healthy weight. Waist circumference. This measures the distance around your waistline. This measurement also tells if you are at a healthy weight and may help predict your risk of certain diseases, such as type 2 diabetes and high blood pressure. Heart rate and blood pressure. Body temperature. Skin for abnormal spots. What immunizations do I need?  Vaccines are usually given at various ages, according to a schedule. Your health care provider will recommend vaccines for you based on your age, medical history, and lifestyle or other factors, such as travel or where you work. What tests do I need? Screening Your health care provider may recommend screening tests for certain conditions. This may include: Lipid and cholesterol levels. Diabetes screening. This is done by checking your blood sugar (glucose) after you have not eaten for a while (fasting). Pelvic exam and Pap test. Hepatitis B test. Hepatitis C  test. HIV (human immunodeficiency virus) test. STI (sexually transmitted infection) testing, if you are at risk. Lung cancer screening. Colorectal cancer screening. Mammogram. Talk with your health care provider about when you should start having regular mammograms. This may depend on whether you have a family history of breast cancer. BRCA-related cancer screening. This may be done if you have a family history of breast, ovarian, tubal, or peritoneal cancers. Bone density scan. This is done to screen for osteoporosis. Talk with your health care provider about your test results, treatment options, and if necessary, the need for more tests. Follow these instructions at home: Eating and drinking  Eat a diet that includes fresh fruits and vegetables, whole grains, lean protein, and low-fat dairy products. Take vitamin and mineral supplements as recommended by your health care provider. Do not drink alcohol if: Your health care provider tells you not to drink. You are pregnant, may be pregnant, or are planning to become pregnant. If you drink alcohol: Limit how much you have to 0-1 drink a day. Know how much alcohol is in your drink. In the [place] one drink equals one 12 oz bottle of beer (355 mL), one 5 oz glass of wine (148 mL), or one 1 oz glass of hard liquor (44 mL). Lifestyle Brush your teeth every morning and night with fluoride toothpaste. Floss one time each day. Exercise for at least 30 minutes 5 or more days each week. Do not use any products that contain nicotine or tobacco. These products include cigarettes, chewing tobacco, and vaping devices, such as e-cigarettes. If you need help quitting, ask your health care provider. Do not use drugs. If you are sexually active, practice safe sex. Use a condom or other form of protection to   prevent STIs. If you do not wish to become pregnant, use a form of birth control. If you plan to become pregnant, see your health care provider for a  prepregnancy visit. Take aspirin only as told by your health care provider. Make sure that you understand how much to take and what form to take. Work with your health care provider to find out whether it is safe and beneficial for you to take aspirin daily. Find healthy ways to manage stress, such as: Meditation, yoga, or listening to music. Journaling. Talking to a trusted person. Spending time with friends and family. Minimize exposure to UV radiation to reduce your risk of skin cancer. Safety Always wear your seat belt while driving or riding in a vehicle. Do not drive: If you have been drinking alcohol. Do not ride with someone who has been drinking. When you are tired or distracted. While texting. If you have been using any mind-altering substances or drugs. Wear a helmet and other protective equipment during sports activities. If you have firearms in your house, make sure you follow all gun safety procedures. Seek help if you have been physically or sexually abused. What's next? Visit your health care provider once a year for an annual wellness visit. Ask your health care provider how often you should have your eyes and teeth checked. Stay up to date on all vaccines. This information is not intended to replace advice given to you by your health care provider. Make sure you discuss any questions you have with your health care provider. Document Revised: 03/07/2021 Document Reviewed: 03/07/2021 Elsevier Patient Education  2023 [employer]  

## 2023-02-13 ENCOUNTER — Ambulatory Visit (INDEPENDENT_AMBULATORY_CARE_PROVIDER_SITE_OTHER): Payer: BC Managed Care – PPO | Admitting: Family Medicine

## 2023-02-13 ENCOUNTER — Encounter: Payer: Self-pay | Admitting: Family Medicine

## 2023-02-13 ENCOUNTER — Other Ambulatory Visit: Payer: Self-pay

## 2023-02-13 VITALS — BP 130/82 | HR 78 | Temp 97.6°F | Resp 14 | Ht 64.0 in | Wt 227.0 lb

## 2023-02-13 DIAGNOSIS — Z79899 Other long term (current) drug therapy: Secondary | ICD-10-CM

## 2023-02-13 DIAGNOSIS — F339 Major depressive disorder, recurrent, unspecified: Secondary | ICD-10-CM

## 2023-02-13 DIAGNOSIS — E786 Lipoprotein deficiency: Secondary | ICD-10-CM

## 2023-02-13 DIAGNOSIS — E559 Vitamin D deficiency, unspecified: Secondary | ICD-10-CM

## 2023-02-13 DIAGNOSIS — E8881 Metabolic syndrome: Secondary | ICD-10-CM | POA: Diagnosis not present

## 2023-02-13 DIAGNOSIS — I10 Essential (primary) hypertension: Secondary | ICD-10-CM

## 2023-02-13 DIAGNOSIS — F5105 Insomnia due to other mental disorder: Secondary | ICD-10-CM

## 2023-02-13 DIAGNOSIS — Z0000 Encounter for general adult medical examination without abnormal findings: Secondary | ICD-10-CM | POA: Diagnosis not present

## 2023-02-13 DIAGNOSIS — M5137 Other intervertebral disc degeneration, lumbosacral region: Secondary | ICD-10-CM

## 2023-02-13 LAB — CBC WITH DIFFERENTIAL/PLATELET
Eosinophils Absolute: 190 cells/uL (ref 15–500)
HCT: 43 % (ref 35.0–45.0)
MCH: 30 pg (ref 27.0–33.0)
Monocytes Relative: 6.1 %
Platelets: 374 10*3/uL (ref 140–400)

## 2023-02-14 LAB — COMPLETE METABOLIC PANEL WITH GFR
AG Ratio: 1.6 (calc) (ref 1.0–2.5)
ALT: 20 U/L (ref 6–29)
AST: 20 U/L (ref 10–35)
Albumin: 4.3 g/dL (ref 3.6–5.1)
Alkaline phosphatase (APISO): 73 U/L (ref 37–153)
BUN: 16 mg/dL (ref 7–25)
CO2: 27 mmol/L (ref 20–32)
Calcium: 9.3 mg/dL (ref 8.6–10.4)
Chloride: 103 mmol/L (ref 98–110)
Creat: 0.66 mg/dL (ref 0.50–1.03)
Globulin: 2.7 g/dL (calc) (ref 1.9–3.7)
Glucose, Bld: 73 mg/dL (ref 65–99)
Potassium: 4.3 mmol/L (ref 3.5–5.3)
Sodium: 139 mmol/L (ref 135–146)
Total Bilirubin: 0.5 mg/dL (ref 0.2–1.2)
Total Protein: 7 g/dL (ref 6.1–8.1)
eGFR: 107 mL/min/{1.73_m2} (ref >=60)

## 2023-02-14 LAB — CBC WITH DIFFERENTIAL/PLATELET
Absolute Monocytes: 445 cells/uL (ref 200–950)
Basophils Absolute: 80 cells/uL (ref 0–200)
Basophils Relative: 1.1 %
Eosinophils Relative: 2.6 %
Hemoglobin: 14.3 g/dL (ref 11.7–15.5)
Lymphs Abs: 3227 cells/uL (ref 850–3900)
MCHC: 33.3 g/dL (ref 32.0–36.0)
MCV: 90.3 fL (ref 80.0–100.0)
MPV: 10.3 fL (ref 7.5–12.5)
Neutro Abs: 3358 cells/uL (ref 1500–7800)
Neutrophils Relative %: 46 %
RBC: 4.76 10*6/uL (ref 3.80–5.10)
RDW: 13.7 % (ref 11.0–15.0)
Total Lymphocyte: 44.2 %
WBC: 7.3 10*3/uL (ref 3.8–10.8)

## 2023-02-14 LAB — HEMOGLOBIN A1C
Hgb A1c MFr Bld: 5.3 % of total Hgb (ref <5.7)
Mean Plasma Glucose: 105 mg/dL
eAG (mmol/L): 5.8 mmol/L

## 2023-02-14 LAB — B12 AND FOLATE PANEL
Folate: 17.6 ng/mL
Vitamin B-12: 489 pg/mL (ref 200–1100)

## 2023-02-14 LAB — LIPID PANEL
Cholesterol: 215 mg/dL — ABNORMAL HIGH (ref <200)
HDL: 58 mg/dL (ref >=50)
LDL Cholesterol (Calc): 136 mg/dL (calc) — ABNORMAL HIGH
Non-HDL Cholesterol (Calc): 157 mg/dL (calc) — ABNORMAL HIGH (ref <130)
Total CHOL/HDL Ratio: 3.7 (calc) (ref <5.0)
Triglycerides: 105 mg/dL (ref <150)

## 2023-02-14 LAB — VITAMIN D 25 HYDROXY (VIT D DEFICIENCY, FRACTURES): Vit D, 25-Hydroxy: 28 ng/mL — ABNORMAL LOW (ref 30–100)

## 2023-02-14 MED ORDER — CELECOXIB 100 MG PO CAPS
100.0000 mg | ORAL_CAPSULE | Freq: Two times a day (BID) | ORAL | 0 refills | Status: DC
Start: 2023-02-14 — End: 2023-08-08

## 2023-02-14 MED ORDER — DULOXETINE HCL 60 MG PO CPEP
60.0000 mg | ORAL_CAPSULE | Freq: Every day | ORAL | 0 refills | Status: DC
Start: 2023-02-14 — End: 2023-08-08

## 2023-02-14 MED ORDER — TRIAMTERENE-HCTZ 37.5-25 MG PO TABS: 1.0000 | ORAL_TABLET | Freq: Every day | ORAL | 0 refills | Status: DC

## 2023-02-14 MED ORDER — QUETIAPINE FUMARATE 25 MG PO TABS
25.0000 mg | ORAL_TABLET | Freq: Every evening | ORAL | 0 refills | Status: DC
Start: 2023-02-14 — End: 2023-08-08

## 2023-03-07 NOTE — Progress Notes (Deleted)
Name: Cheyenne Walker   MRN: [id number]    DOB: 1971/08/22   Date:03/07/2023       Progress Note  Subjective  Chief Complaint  Follow Up  HPI  MDD: she was feeling better during her last visit , she is living with her daughter and helps with grandchildren. She worries about her son that is in [place] and is coming home in May. She states she has difficulty sleeping, seems to fall asleep when is time to get up. She has been taking Duloxetine for the past 6 months but is still depressed, frustrated about her weight. She is willing to add Seroquel for mood and sleep. Discussed possible side effects.   FMS: she states for years she has aches and pains on her muscles. She is now on Duloxetine and it seems to help with her generalized body aches. Right now pain is zero  Spider veins on left lower extremity: it burns when she walks, discussed leg sleeve   Morbid obesity: BMI above 40 , she is still doing step challenge at work, weight is down 5 lbs since last visit. She was given [place] last year but out of stock, she really wants to lose weight, cooks at home, she gets at least 10000 steps per day. She has metabolic syndrome, HTN and DDD. She would benefit from weight loss medication and we will try sending Zepbound to her pharmacy    Low HDL: she has been more active, discussed increase in tree nuts ,fruits and vegetables . We will recheck labs during her CPE   HTN: bp is high now - states did not take medication before she left her house,  she is compliant with medication Triamterene hcz and denies side effects. She denies chest pain, palpitation or sob.   DDD lumbar spine/Chronic pain: she has been taking diclofenac prn, not daily, but it helps with pain and inflammation. She feels stiff and took muscle relaxer in the past but did not help, she tried gabapentin and Lyrica but both inefective. She stopped going to pain clinic, she finished PT, noticing burning on left lower leg while walking that  radiates to her back, she does not want to go back to PT. Advised a few days of nsaid's and resume exercises at home    Vitamin D deficiency: resume supplementation   Patient Active Problem List   Diagnosis Date Noted   Screening for colon cancer    Adenomatous polyp of transverse colon    Adenomatous polyp of ascending colon    Lumbar radiculopathy (L5) 10/08/2018   Chronic pain syndrome 10/08/2018   DDD (degenerative disc disease), lumbosacral 05/01/2017   Anxiety 04/03/2015   Benign essential HTN 04/03/2015   Allergic rhinitis 04/03/2015   Gastro-esophageal reflux disease without esophagitis 04/03/2015   Major depression, recurrent, chronic (HCC) 04/03/2015   Morbid obesity with BMI of 40.0-44.9, adult (HCC) 04/03/2015   Dysmetabolic syndrome 04/03/2015   Numerous moles 04/03/2015   Plantar fasciitis 04/03/2015   PTSD (post-traumatic stress disorder) 04/03/2015   Vitamin D deficiency 04/03/2015   Umbilical hernia without obstruction and without gangrene 04/03/2015    Past Surgical History:  Procedure Laterality Date   CHOLECYSTECTOMY  2006   COLONOSCOPY WITH PROPOFOL N/A 06/05/2022   Procedure: COLONOSCOPY WITH PROPOFOL;  Surgeon: Toney Reil, MD;  Location: [facility] ENDOSCOPY;  Service: Gastroenterology;  Laterality: N/A;   HERNIA REPAIR  10/2014    Family History  Problem Relation Age of Onset   Hypertension Mother  Hypertension Sister    Hypertension Brother     Social History   Tobacco Use   Smoking status: Never   Smokeless tobacco: Never  Substance Use Topics   Alcohol use: No    Alcohol/week: 0.0 standard drinks of alcohol     Current Outpatient Medications:    celecoxib (CELEBREX) 100 MG capsule, Take 1 capsule (100 mg total) by mouth 2 (two) times daily., Disp: 60 capsule, Rfl: 0   DULoxetine (CYMBALTA) 60 MG capsule, Take 1 capsule (60 mg total) by mouth daily. Take 60 mg dose when done, Disp: 30 capsule, Rfl: 0   fluticasone (FLONASE) 50  MCG/ACT nasal spray, Place 2 sprays into both nostrils daily., Disp: 48 g, Rfl: 0   QUEtiapine (SEROQUEL) 25 MG tablet, Take 1 tablet (25 mg total) by mouth every evening., Disp: 30 tablet, Rfl: 0   tirzepatide (ZEPBOUND) 2.5 MG/0.5ML Pen, Inject 2.5 mg into the skin once a week., Disp: 2 mL, Rfl: 0   tirzepatide (ZEPBOUND) 5 MG/0.5ML Pen, Inject 5 mg into the skin once a week., Disp: 2 mL, Rfl: 0   tirzepatide (ZEPBOUND) 7.5 MG/0.5ML Pen, Inject 7.5 mg into the skin once a week., Disp: 2 mL, Rfl: 0   triamterene-hydrochlorothiazide (MAXZIDE-25) 37.5-25 MG tablet, Take 1 tablet by mouth daily., Disp: 30 tablet, Rfl: 0  Allergies  Allergen Reactions   Ace Inhibitors Other (See Comments)    Cough. Pt states specifically catopril    I personally reviewed active problem list, medication list, allergies, family history, social history, health maintenance with the patient/caregiver today.   ROS  ***  Objective  There were no vitals filed for this visit.  There is no height or weight on file to calculate BMI.  Physical Exam ***   PHQ2/9:    02/13/2023    9:22 AM 12/20/2022    7:48 AM 05/13/2022    1:32 PM 09/20/2021    9:23 AM 04/12/2021    3:48 PM  Depression screen PHQ 2/9  Decreased Interest 0 2 2 0 0  Down, Depressed, Hopeless 0 2 2 0 0  PHQ - 2 Score 0 4 4 0 0  Altered sleeping 0 2 3 0 2  Tired, decreased energy 1 2 3  0 3  Change in appetite 0 2 3 0 3  Feeling bad or failure about yourself  0 2 2 0 3  Trouble concentrating 0 0 0 0 0  Moving slowly or fidgety/restless 0 0 0 0 1  Suicidal thoughts 0 0 0 0 0  PHQ-9 Score 1 12 15  0 12  Difficult doing work/chores Not difficult at all Somewhat difficult Not difficult at all  Not difficult at all    phq 9 is {gen pos [id number]}   Fall Risk:    02/13/2023    9:22 AM 12/20/2022    7:48 AM 05/13/2022    1:32 PM 09/20/2021    9:23 AM 04/12/2021    3:47 PM  Fall Risk   Falls in the past year? 0 0 1 1 1   Number falls in  past yr:  0 0 0 0  Injury with Fall?  0 0 0 0  Risk for fall due to : No Fall Risks No Fall Risks History of fall(s) No Fall Risks   Follow up Falls prevention discussed;Education provided;Falls evaluation completed Falls prevention discussed;Education provided;Falls evaluation completed Falls prevention discussed;Education provided;Falls evaluation completed Falls prevention discussed       Functional Status Survey:  Assessment & Plan  *** There are no diagnoses linked to this encounter.

## 2023-03-10 ENCOUNTER — Ambulatory Visit: Payer: BC Managed Care – PPO | Admitting: Family Medicine

## 2023-03-19 NOTE — Progress Notes (Deleted)
Name: Cheyenne Walker   MRN: [id number]    DOB: [birth year]   Date:03/19/2023       Progress Note  Subjective  Chief Complaint  Follow Up  HPI  MDD: she was feeling better during her last visit , she is living with her daughter and helps with grandchildren. She worries about her son that is in [place] and is coming home in May. She states she has difficulty sleeping, seems to fall asleep when is time to get up. She has been taking Duloxetine for the past 6 months but is still depressed, frustrated about her weight. She is willing to add Seroquel for mood and sleep. Discussed possible side effects.   FMS: she states for years she has aches and pains on her muscles. She is now on Duloxetine and it seems to help with her generalized body aches. Right now pain is zero  Spider veins on left lower extremity: it burns when she walks, discussed leg sleeve   Morbid obesity: BMI above 40 , she is still doing step challenge at work, weight is down 5 lbs since last visit. She was given [place] last year but out of stock, she really wants to lose weight, cooks at home, she gets at least 10000 steps per day. She has metabolic syndrome, HTN and DDD. She would benefit from weight loss medication and we will try sending Zepbound to her pharmacy    Low HDL: she has been more active, discussed increase in tree nuts ,fruits and vegetables . We will recheck labs during her CPE   HTN: bp is high now - states did not take medication before she left her house,  she is compliant with medication Triamterene hcz and denies side effects. She denies chest pain, palpitation or sob.   DDD lumbar spine/Chronic pain: she has been taking diclofenac prn, not daily, but it helps with pain and inflammation. She feels stiff and took muscle relaxer in the past but did not help, she tried gabapentin and Lyrica but both inefective. She stopped going to pain clinic, she finished PT, noticing burning on left lower leg while walking that  radiates to her back, she does not want to go back to PT. Advised a few days of nsaid's and resume exercises at home    Vitamin D deficiency: resume supplementation   Patient Active Problem List   Diagnosis Date Noted   Screening for colon cancer    Adenomatous polyp of transverse colon    Adenomatous polyp of ascending colon    Lumbar radiculopathy (L5) 10/08/2018   Chronic pain syndrome 10/08/2018   DDD (degenerative disc disease), lumbosacral 05/01/2017   Anxiety 04/03/2015   Benign essential HTN 04/03/2015   Allergic rhinitis 04/03/2015   Gastro-esophageal reflux disease without esophagitis 04/03/2015   Major depression, recurrent, chronic (HCC) 04/03/2015   Morbid obesity with BMI of 40.0-44.9, adult (HCC) 04/03/2015   Dysmetabolic syndrome 04/03/2015   Numerous moles 04/03/2015   Plantar fasciitis 04/03/2015   PTSD (post-traumatic stress disorder) 04/03/2015   Vitamin D deficiency 04/03/2015   Umbilical hernia without obstruction and without gangrene 04/03/2015    Past Surgical History:  Procedure Laterality Date   CHOLECYSTECTOMY  2006   COLONOSCOPY WITH PROPOFOL N/A 06/05/2022   Procedure: COLONOSCOPY WITH PROPOFOL;  Surgeon: Toney Reil, MD;  Location: [facility] END[facility]COPY;  Service: Gastroenterology;  Laterality: N/A;   HERNIA REPAIR  10/2014    Family History  Problem Relation Age of Onset   Hypertension Mother  Hypertension Sister    Hypertension Brother     Social History   Tobacco Use   Smoking status: Never   Smokeless tobacco: Never  Substance Use Topics   Alcohol use: No    Alcohol/week: 0.0 standard drinks of alcohol     Current Outpatient Medications:    celecoxib (CELEBREX) 100 MG capsule, Take 1 capsule (100 mg total) by mouth 2 (two) times daily., Disp: 60 capsule, Rfl: 0   DULoxetine (CYMBALTA) 60 MG capsule, Take 1 capsule (60 mg total) by mouth daily. Take 60 mg dose when done, Disp: 30 capsule, Rfl: 0   fluticasone (FLONASE) 50  MCG/ACT nasal spray, Place 2 sprays into both nostrils daily., Disp: 48 g, Rfl: 0   QUEtiapine (SEROQUEL) 25 MG tablet, Take 1 tablet (25 mg total) by mouth every evening., Disp: 30 tablet, Rfl: 0   tirzepatide (ZEPBOUND) 2.5 MG/0.5ML Pen, Inject 2.5 mg into the skin once a week., Disp: 2 mL, Rfl: 0   tirzepatide (ZEPBOUND) 5 MG/0.5ML Pen, Inject 5 mg into the skin once a week., Disp: 2 mL, Rfl: 0   tirzepatide (ZEPBOUND) 7.5 MG/0.5ML Pen, Inject 7.5 mg into the skin once a week., Disp: 2 mL, Rfl: 0   triamterene-hydrochlorothiazide (MAXZIDE-25) 37.5-25 MG tablet, Take 1 tablet by mouth daily., Disp: 30 tablet, Rfl: 0  Allergies  Allergen Reactions   Ace Inhibitors Other (See Comments)    Cough. Pt states specifically catopril    I personally reviewed active problem list, medication list, allergies, family history, social history, health maintenance with the patient/caregiver today.   ROS  ***  Objective  There were no vitals filed for this visit.  There is no height or weight on file to calculate BMI.  Physical Exam ***   PHQ2/9:    02/13/2023    9:22 AM 12/20/2022    7:48 AM 05/13/2022    1:32 PM 09/20/2021    9:23 AM 04/12/2021    3:48 PM  Depression screen PHQ 2/9  Decreased Interest 0 2 2 0 0  Down, Depressed, Hopeless 0 2 2 0 0  PHQ - 2 Score 0 4 4 0 0  Altered sleeping 0 2 3 0 2  Tired, decreased energy 1 2 3  0 3  Change in appetite 0 2 3 0 3  Feeling bad or failure about yourself  0 2 2 0 3  Trouble concentrating 0 0 0 0 0  Moving slowly or fidgety/restless 0 0 0 0 1  Suicidal thoughts 0 0 0 0 0  PHQ-9 Score 1 12 15  0 12  Difficult doing work/chores Not difficult at all Somewhat difficult Not difficult at all  Not difficult at all    phq 9 is {gen pos [id number]}   Fall Risk:    02/13/2023    9:22 AM 12/20/2022    7:48 AM 05/13/2022    1:32 PM 09/20/2021    9:23 AM 04/12/2021    3:47 PM  Fall Risk   Falls in the past year? 0 0 1 1 1   Number falls in  past yr:  0 0 0 0  Injury with Fall?  0 0 0 0  Risk for fall due to : No Fall Risks No Fall Risks History of fall(s) No Fall Risks   Follow up Falls prevention discussed;Education provided;Falls evaluation completed Falls prevention discussed;Education provided;Falls evaluation completed Falls prevention discussed;Education provided;Falls evaluation completed Falls prevention discussed       Functional Status Survey:  Assessment & Plan  *** There are no diagnoses linked to this encounter.

## 2023-03-20 ENCOUNTER — Ambulatory Visit: Payer: BC Managed Care – PPO | Admitting: Family Medicine

## 2023-03-28 ENCOUNTER — Ambulatory Visit
Admission: EM | Admit: 2023-03-28 | Discharge: 2023-03-28 | Disposition: A | Discharge status: Home / Self Care | Payer: BC Managed Care – PPO | Attending: Urgent Care | Admitting: Urgent Care

## 2023-03-28 ENCOUNTER — Encounter: Payer: Self-pay | Admitting: Emergency Medicine

## 2023-03-28 DIAGNOSIS — R6889 Other general symptoms and signs: Secondary | ICD-10-CM | POA: Diagnosis not present

## 2023-03-28 MED ORDER — BENZONATATE 100 MG PO CAPS
ORAL_CAPSULE | ORAL | 0 refills | Status: DC
Start: 2023-03-28 — End: 2023-08-08

## 2023-03-28 MED ORDER — PROMETHAZINE-DM 6.25-15 MG/5ML PO SYRP
5.0000 mL | ORAL_SOLUTION | Freq: Four times a day (QID) | ORAL | 0 refills | Status: DC | PRN
Start: 2023-03-28 — End: 2023-04-03

## 2023-03-28 NOTE — Discharge Instructions (Addendum)
I have prescribed cough medicine to relieve your symptoms.  The cough syrup is likely to make you sleepy.  Try to push hydration as much as possible.  Food as tolerated.  Use fever reducing medications such as Tylenol or ibuprofen.  Get plenty of rest.

## 2023-03-28 NOTE — ED Provider Notes (Signed)
Cheyenne Walker    CSN: [id number] Arrival date & time: 03/28/23  4098      History   Chief Complaint Chief Complaint  Patient presents with   Cough   Ear Fullness   Headache    HPI Cheyenne Walker is a 52 y.o. female.    Cough Associated symptoms: headaches   Ear Fullness Associated symptoms include headaches.  Headache Associated symptoms: cough     Presents to UC with symptoms starting 3 days ago.  She endorses fever, cough, headache, ear fullness, chest soreness, sensitivity to light and sound.  She reports mild loss of taste, lack of appetite, nausea, diarrhea.  She denies sore throat, denies abdominal pain.  No vomiting.  She has been using Tylenol, Coricidin, TheraFlu for her symptoms without much relief.  Reports taking BC powder yesterday for headache without improvement.  Present in clinic with elevated temperature 101.2 F.  Past Medical History:  Diagnosis Date   ADD (attention deficit disorder)    Hypertension    Vitamin D deficiency     Patient Active Problem List   Diagnosis Date Noted   Screening for colon cancer    Adenomatous polyp of transverse colon    Adenomatous polyp of ascending colon    Lumbar radiculopathy (L5) 10/08/2018   Chronic pain syndrome 10/08/2018   DDD (degenerative disc disease), lumbosacral 05/01/2017   Anxiety 04/03/2015   Benign essential HTN 04/03/2015   Allergic rhinitis 04/03/2015   Gastro-esophageal reflux disease without esophagitis 04/03/2015   Major depression, recurrent, chronic (HCC) 04/03/2015   Morbid obesity with BMI of 40.0-44.9, adult (HCC) 04/03/2015   Dysmetabolic syndrome 04/03/2015   Numerous moles 04/03/2015   Plantar fasciitis 04/03/2015   PTSD (post-traumatic stress disorder) 04/03/2015   Vitamin D deficiency 04/03/2015   Umbilical hernia without obstruction and without gangrene 04/03/2015    Past Surgical History:  Procedure Laterality Date   CHOLECYSTECTOMY  2006   COLONOSCOPY  WITH PROPOFOL N/A 06/05/2022   Procedure: COLONOSCOPY WITH PROPOFOL;  Surgeon: Toney Reil, MD;  Location: [facility] ENDOSCOPY;  Service: Gastroenterology;  Laterality: N/A;   HERNIA REPAIR  10/2014    OB History     Gravida  5   Para  4   Term  4   Preterm  0   AB  1   Living  4      SAB      IAB      Ectopic      Multiple      Live Births               Home Medications    Prior to Admission medications   Medication Sig Start Date End Date Taking? Authorizing Provider  celecoxib (CELEBREX) 100 MG capsule Take 1 capsule (100 mg total) by mouth 2 (two) times daily. 02/14/23  Yes [name], Danna Hefty, MD  DULoxetine (CYMBALTA) 60 MG capsule Take 1 capsule (60 mg total) by mouth daily. Take 60 mg dose when done 02/14/23  Yes [name], Danna Hefty, MD  fluticasone [facility]) 50 MCG/ACT nasal spray Place 2 sprays into both nostrils daily. 12/20/22   Alba Cory, MD  QUEtiapine (SEROQUEL) 25 MG tablet Take 1 tablet (25 mg total) by mouth every evening. 02/14/23   Carlynn Purl, Danna Hefty, MD  tirzepatide (ZEPBOUND) 2.5 MG/0.5ML Pen Inject 2.5 mg into the skin once a week. 12/20/22   Alba Cory, MD  tirzepatide (ZEPBOUND) 5 MG/0.5ML Pen Inject 5 mg into the skin once a week. 12/20/22  Alba Cory, MD  tirzepatide (ZEPBOUND) 7.5 MG/0.5ML Pen Inject 7.5 mg into the skin once a week. 12/20/22   Alba Cory, MD  triamterene-hydrochlorothiazide (MAXZIDE-25) 37.5-25 MG tablet Take 1 tablet by mouth daily. 02/14/23   Alba Cory, MD  omeprazole (PRILOSEC) 20 MG capsule Take 1 capsule (20 mg total) by mouth 2 (two) times daily before a meal. 07/25/20 08/30/20  Moshe Cipro, NP  pregabalin (LYRICA) 50 MG capsule Take 1-3 capsules (50-150 mg total) by mouth at bedtime as needed. 07/18/20 08/30/20  Alba Cory, MD    Family History Family History  Problem Relation Age of Onset   Hypertension Mother    Hypertension Sister    Hypertension Brother     Social History Social  History   Tobacco Use   Smoking status: Never   Smokeless tobacco: Never  Vaping Use   Vaping Use: Never used  Substance Use Topics   Alcohol use: No    Alcohol/week: 0.0 standard drinks of alcohol   Drug use: No     Allergies   Ace inhibitors   Review of Systems Review of Systems  Respiratory:  Positive for cough.   Neurological:  Positive for headaches.     Physical Exam Triage Vital Signs ED Triage Vitals  Enc Vitals Group     BP 03/28/23 0852 (!) 141/89     Pulse Rate 03/28/23 0852 76     Resp 03/28/23 0852 18     Temp 03/28/23 0852 (!) 101.2 F (38.4 C)     Temp Source 03/28/23 0852 Oral     SpO2 03/28/23 0852 97 %     Weight --      Height --      Head Circumference --      Peak Flow --      Pain Score 03/28/23 0900 5     Pain Loc --      Pain Edu? --      Excl. in GC? --    No data found.  Updated Vital Signs BP (!) 141/89 (BP Location: Left Arm)   Pulse 76   Temp (!) 101.2 F (38.4 C) (Oral)   Resp 18   LMP 10/29/2015   SpO2 97%   Visual Acuity Right Eye Distance:   Left Eye Distance:   Bilateral Distance:    Right Eye Near:   Left Eye Near:    Bilateral Near:     Physical Exam Vitals reviewed.  Constitutional:      Appearance: She is well-developed. She is ill-appearing.  HENT:     Right Ear: Tympanic membrane normal.     Left Ear: Tympanic membrane normal.     Mouth/Throat:     Pharynx: No posterior oropharyngeal erythema.     Tonsils: No tonsillar exudate.  Cardiovascular:     Rate and Rhythm: Normal rate and regular rhythm.  Pulmonary:     Effort: Pulmonary effort is normal.     Breath sounds: Normal breath sounds.  Musculoskeletal:     Cervical back: Normal range of motion and neck supple.  Lymphadenopathy:     Cervical: No cervical adenopathy.  Skin:    General: Skin is warm and dry.  Neurological:     Mental Status: She is alert and oriented to person, place, and time.  Psychiatric:        Mood and Affect: Mood  normal.        Behavior: Behavior normal.      UC Treatments / Results  Labs (all labs ordered are listed, but only abnormal results are displayed) Labs Reviewed - No data to display  EKG   Radiology No results found.  Procedures Procedures (including critical care time)  Medications Ordered in UC Medications - No data to display  Initial Impression / Assessment and Plan / UC Course  I have reviewed the triage vital signs and the nursing notes.  Pertinent labs & imaging results that were available during my care of the patient were reviewed by me and considered in my medical decision making (see chart for details).   HENSLEY KASCHAK is a 52 y.o. female presenting with flu like symptoms. Patient is afebrile without recent antipyretics, satting well on room air. Overall is ill appearing though non-toxic, well hydrated, without respiratory distress. Pulmonary exam is unremarkable.  Lungs CTAB without wheezing, rhonchi, rales. RRR without murmurs, rubs, gallops.  TMs are WNL bilaterally.  There is no pharyngeal erythema or peritonsillar exudate.  Reviewed relevant chart history.   Suspect viral etiology including flu or COVID.  Her symptoms are almost 5 days and COVID result would not be available for additional day, thus antiviral therapy is not available to her.  Recommending supportive care including pushing hydration, food as tolerated, antipyretics.  Suggest ibuprofen taken every 6-8 hours for pain and fever relief.  Counseled patient on potential for adverse effects with medications prescribed/recommended today, ER and return-to-clinic precautions discussed, patient verbalized understanding and agreement with care plan.  Final Clinical Impressions(s) / UC Diagnoses   Final diagnoses:  None   Discharge Instructions   None    ED Prescriptions   None    PDMP not reviewed this encounter.   Charma Igo, [place] 03/28/23 [phone]

## 2023-03-28 NOTE — ED Triage Notes (Addendum)
Chills starting Tuesday. Progressing into cough, headache, ear fullness, chest soreness, fever, sensitivities to light and sound, mild loss of taste, lack of appetite, nausea, diarrhea. Denies sore throat, abdominal pain, vomiting. Taking tylenol 500 mg, coricidin, and theraflu without much relief. Has not taken any medication this morning. Took a BC powder for the headache last night without improvement

## 2023-04-03 ENCOUNTER — Ambulatory Visit
Admission: EM | Admit: 2023-04-03 | Discharge: 2023-04-03 | Disposition: A | Discharge status: Home / Self Care | Payer: BC Managed Care – PPO | Attending: Urgent Care | Admitting: Urgent Care

## 2023-04-03 ENCOUNTER — Encounter: Payer: Self-pay | Admitting: Emergency Medicine

## 2023-04-03 DIAGNOSIS — B9689 Other specified bacterial agents as the cause of diseases classified elsewhere: Secondary | ICD-10-CM

## 2023-04-03 DIAGNOSIS — J019 Acute sinusitis, unspecified: Secondary | ICD-10-CM

## 2023-04-03 LAB — POCT RAPID STREP A (OFFICE): Rapid Strep A Screen: POSITIVE — AB

## 2023-04-03 MED ORDER — AMOXICILLIN-POT CLAVULANATE 875-125 MG PO TABS: 1.0000 | ORAL_TABLET | Freq: Two times a day (BID) | ORAL | 0 refills | Status: DC

## 2023-04-03 MED ORDER — HYDROCOD POLI-CHLORPHE POLI ER 10-8 MG/5ML PO SUER: 5.0000 mL | Freq: Two times a day (BID) | ORAL | 0 refills | Status: AC | PRN

## 2023-04-03 NOTE — Discharge Instructions (Signed)
Follow up here or with your primary care provider if your symptoms are worsening or not improving with treatment.     

## 2023-04-03 NOTE — ED Triage Notes (Signed)
Here for same complaint 1 week ago. Presents today with continued cough and worsening sore throat.

## 2023-04-03 NOTE — ED Provider Notes (Signed)
[name]    CSN: [id number] Arrival date & time: 04/03/23  0802      History   Chief Complaint Chief Complaint  Patient presents with   Cough   Sore Throat    HPI Cheyenne Walker is a 52 y.o. female.    Cough Sore Throat  Presents to urgent care for follow-up after visit 1 week ago when she was seen for 3 days of fever, cough, headache, ear fullness, chest soreness, sensitivity to light and sound.  At that time she reported loss of taste, lack of appetite, nausea and diarrhea.  She denied sore throat and abdominal pain.  She reports today some change in symptoms.  No fever.  Improved ear fullness.  She does report sore throat which is acutely worsening last night.  Continues to report headache.  Past Medical History:  Diagnosis Date   ADD (attention deficit disorder)    Hypertension    Vitamin D deficiency     Patient Active Problem List   Diagnosis Date Noted   Screening for colon cancer    Adenomatous polyp of transverse colon    Adenomatous polyp of ascending colon    Lumbar radiculopathy (L5) 10/08/2018   Chronic pain syndrome 10/08/2018   DDD (degenerative disc disease), lumbosacral 05/01/2017   Anxiety 04/03/2015   Benign essential HTN 04/03/2015   Allergic rhinitis 04/03/2015   Gastro-esophageal reflux disease without esophagitis 04/03/2015   Major depression, recurrent, chronic (HCC) 04/03/2015   Morbid obesity with BMI of 40.0-44.9, adult (HCC) 04/03/2015   Dysmetabolic syndrome 04/03/2015   Numerous moles 04/03/2015   Plantar fasciitis 04/03/2015   PTSD (post-traumatic stress disorder) 04/03/2015   Vitamin D deficiency 04/03/2015   Umbilical hernia without obstruction and without gangrene 04/03/2015    Past Surgical History:  Procedure Laterality Date   CHOLECYSTECTOMY  2006   COLONOSCOPY WITH PROPOFOL N/A 06/05/2022   Procedure: COLONOSCOPY WITH PROPOFOL;  Surgeon: Toney Reil, MD;  Location: [facility] ENDOSCOPY;  Service:  Gastroenterology;  Laterality: N/A;   HERNIA REPAIR  10/2014    OB History     Gravida  5   Para  4   Term  4   Preterm  0   AB  1   Living  4      SAB      IAB      Ectopic      Multiple      Live Births               Home Medications    Prior to Admission medications   Medication Sig Start Date End Date Taking? Authorizing Provider  benzonatate (TESSALON) 100 MG capsule Take 1-2 tablets 3 times a day as needed for cough 03/28/23   Aquanetta Schwarz, Jeannett Senior, FNP  celecoxib (CELEBREX) 100 MG capsule Take 1 capsule (100 mg total) by mouth 2 (two) times daily. 02/14/23   Alba Cory, MD  DULoxetine (CYMBALTA) 60 MG capsule Take 1 capsule (60 mg total) by mouth daily. Take 60 mg dose when done 02/14/23   Alba Cory, MD  fluticasone [facility]) 50 MCG/ACT nasal spray Place 2 sprays into both nostrils daily. 12/20/22   Alba Cory, MD  promethazine-dextromethorphan (PROMETHAZINE-DM) 6.25-15 MG/5ML syrup Take 5 mLs by mouth 4 (four) times daily as needed for cough. 03/28/23   Trevaris Pennella, Jeannett Senior, FNP  QUEtiapine (SEROQUEL) 25 MG tablet Take 1 tablet (25 mg total) by mouth every evening. 02/14/23   Alba Cory, MD  tirzepatide [facility]) 2.5  MG/0.5ML Pen Inject 2.5 mg into the skin once a week. 12/20/22   Alba Cory, MD  tirzepatide (ZEPBOUND) 5 MG/0.5ML Pen Inject 5 mg into the skin once a week. 12/20/22   Alba Cory, MD  tirzepatide (ZEPBOUND) 7.5 MG/0.5ML Pen Inject 7.5 mg into the skin once a week. 12/20/22   Alba Cory, MD  triamterene-hydrochlorothiazide (MAXZIDE-25) 37.5-25 MG tablet Take 1 tablet by mouth daily. 02/14/23   Alba Cory, MD  omeprazole (PRILOSEC) 20 MG capsule Take 1 capsule (20 mg total) by mouth 2 (two) times daily before a meal. 07/25/20 08/30/20  Moshe Cipro, NP  pregabalin (LYRICA) 50 MG capsule Take 1-3 capsules (50-150 mg total) by mouth at bedtime as needed. 07/18/20 08/30/20  Alba Cory, MD    Family  History Family History  Problem Relation Age of Onset   Hypertension Mother    Hypertension Sister    Hypertension Brother     Social History Social History   Tobacco Use   Smoking status: Never   Smokeless tobacco: Never  Vaping Use   Vaping status: Never Used  Substance Use Topics   Alcohol use: No    Alcohol/week: 0.0 standard drinks of alcohol   Drug use: No     Allergies   Ace inhibitors   Review of Systems Review of Systems  Respiratory:  Positive for cough.      Physical Exam Triage Vital Signs ED Triage Vitals  Encounter Vitals Group     BP 04/03/23 0809 (!) 159/99     Systolic BP Percentile --      Diastolic BP Percentile --      Pulse Rate 04/03/23 0809 69     Resp 04/03/23 0809 16     Temp 04/03/23 0809 99.5 F (37.5 C)     Temp Source 04/03/23 0809 Oral     SpO2 04/03/23 0809 94 %     Weight --      Height --      Head Circumference --      Peak Flow --      Pain Score 04/03/23 0810 0     Pain Loc --      Pain Education --      Exclude from Growth Chart --    No data found.  Updated Vital Signs BP (!) 159/99 (BP Location: Left Arm)   Pulse 69   Temp 99.5 F (37.5 C) (Oral)   Resp 16   LMP 10/29/2015   SpO2 94%   Visual Acuity Right Eye Distance:   Left Eye Distance:   Bilateral Distance:    Right Eye Near:   Left Eye Near:    Bilateral Near:     Physical Exam Vitals reviewed.  Constitutional:      Appearance: She is well-developed. She is ill-appearing.  HENT:     Nose: Congestion present.     Mouth/Throat:     Pharynx: Oropharyngeal exudate and posterior oropharyngeal erythema present.     Tonsils: 2+ on the right. 2+ on the left.  Cardiovascular:     Rate and Rhythm: Normal rate and regular rhythm.     Heart sounds: Normal heart sounds.  Pulmonary:     Effort: Pulmonary effort is normal.     Breath sounds: Normal breath sounds.  Skin:    General: Skin is warm and dry.  Neurological:     General: No focal  deficit present.     Mental Status: She is alert and oriented to person,  place, and time.  Psychiatric:        Mood and Affect: Mood normal.        Behavior: Behavior normal.      UC Treatments / Results  Labs (all labs ordered are listed, but only abnormal results are displayed) Labs Reviewed - No data to display  EKG   Radiology No results found.  Procedures Procedures (including critical care time)  Medications Ordered in UC Medications - No data to display  Initial Impression / Assessment and Plan / UC Course  I have reviewed the triage vital signs and the nursing notes.  Pertinent labs & imaging results that were available during my care of the patient were reviewed by me and considered in my medical decision making (see chart for details).   TRITIA ENDO is a 52 y.o. female presenting with sinusitis and sore throat x 10 days. Patient is afebrile with recent antipyretics, satting well on room air. Overall is ill appearing though non-toxic, well hydrated, without respiratory distress. Pulmonary exam is unremarkable.  Lungs CTAB without wheezing, rhonchi, rales. RRR without murmurs, rubs, gallops.  Very erythematous pharynx.  Some peritonsillar exudate is present.  Reviewed relevant chart history.   Given duration of symptoms, now suspecting secondary bacterial infection affecting her sinuses.  Suspect strep pharyngitis.  Rapid strep result is positive.  Treating with Augmentin x 7 days.  Recommending supportive care and use of OTC medication for symptom control.  Counseled patient on potential for adverse effects with medications prescribed/recommended today, ER and return-to-clinic precautions discussed, patient verbalized understanding and agreement with care plan.  Final Clinical Impressions(s) / UC Diagnoses   Final diagnoses:  Acute bacterial sinusitis     Discharge Instructions      Follow up here or with your primary care provider if your symptoms are  worsening or not improving with treatment.      ED Prescriptions   None    PDMP not reviewed this encounter.   Charma Igo, [place] 04/03/23 [phone]

## 2023-04-07 ENCOUNTER — Other Ambulatory Visit: Payer: Self-pay | Admitting: Family Medicine

## 2023-04-07 DIAGNOSIS — F5105 Insomnia due to other mental disorder: Secondary | ICD-10-CM

## 2023-04-07 DIAGNOSIS — F339 Major depressive disorder, recurrent, unspecified: Secondary | ICD-10-CM

## 2023-06-10 ENCOUNTER — Other Ambulatory Visit: Payer: Self-pay | Admitting: Family Medicine

## 2023-06-10 DIAGNOSIS — I10 Essential (primary) hypertension: Secondary | ICD-10-CM

## 2023-07-17 ENCOUNTER — Other Ambulatory Visit: Payer: Self-pay | Admitting: Family Medicine

## 2023-07-17 DIAGNOSIS — I10 Essential (primary) hypertension: Secondary | ICD-10-CM

## 2023-08-06 ENCOUNTER — Ambulatory Visit
Admission: EM | Admit: 2023-08-06 | Discharge: 2023-08-06 | Disposition: A | Discharge status: Home / Self Care | Payer: BC Managed Care – PPO | Attending: Emergency Medicine | Admitting: Emergency Medicine

## 2023-08-06 DIAGNOSIS — M545 Low back pain, unspecified: Secondary | ICD-10-CM

## 2023-08-06 MED ORDER — PREDNISONE 10 MG (21) PO TBPK: ORAL_TABLET | Freq: Every day | ORAL | 0 refills | Status: DC

## 2023-08-06 MED ORDER — DEXAMETHASONE SODIUM PHOSPHATE 10 MG/ML IJ SOLN
10.0000 mg | Freq: Once | INTRAMUSCULAR | Status: AC
  Administered 2023-08-06: 10 mg via INTRAMUSCULAR

## 2023-08-06 MED ORDER — CYCLOBENZAPRINE HCL 10 MG PO TABS
10.0000 mg | ORAL_TABLET | Freq: Every day | ORAL | 0 refills | Status: DC
Start: 2023-08-06 — End: 2023-12-11

## 2023-08-06 NOTE — ED Triage Notes (Signed)
Triaged by provider  

## 2023-08-06 NOTE — Discharge Instructions (Addendum)
Your pain is most likely caused by irritation to the muscles.  You have been given an injection of Decadron which is a steroid to help reduce inflammation ideally will see some improvement in your symptoms in 30 minutes to an hour  Starting tomorrow take prednisone every morning with food as directed to continue the above process, may use Tylenol or any topical medicine in addition to this  You may use muscle relaxant at bedtime as needed for additional comfort, be mindful of this can make you sleepy  You may use heating pad in 15 minute intervals as needed for additional comfort, or you may find comfort in using ice in 10-15 minutes over affected area  Begin massaging and  stretching affected area daily for 10 minutes as tolerated to further loosen muscles   When sitting and  lying down place pillow underneath and between knees for support  Can try sleeping without pillow on firm mattress   Practice good posture: head back, shoulders back, chest forward, pelvis back and weight distributed evenly on both legs  If pain persist after recommended treatment or reoccurs if may be beneficial to follow up with orthopedic specialist for evaluation, this doctor specializes in the bones and can manage your symptoms long-term with options such as but not limited to imaging, medications or physical therapy

## 2023-08-06 NOTE — ED Provider Notes (Signed)
Cheyenne Walker    CSN: [id number] Arrival date & time: 08/06/23  1257      History   Chief Complaint No chief complaint on file.   HPI Cheyenne Walker is a 52 y.o. female.   For evaluation of constant bilateral low back pain present for 7 days.  Symptoms exacerbated when twisting to the side and when lying directly onto the sides.  Pain does not radiate.  Has attempted use of Advil and additional over-the-counter medicines which did not provide relief.  Denies injury or trauma, numbness, tingling, urinary symptoms.  Past Medical History:  Diagnosis Date   ADD (attention deficit disorder)    Hypertension    Vitamin D deficiency     Patient Active Problem List   Diagnosis Date Noted   Screening for colon cancer    Adenomatous polyp of transverse colon    Adenomatous polyp of ascending colon    Lumbar radiculopathy (L5) 10/08/2018   Chronic pain syndrome 10/08/2018   DDD (degenerative disc disease), lumbosacral 05/01/2017   Anxiety 04/03/2015   Benign essential HTN 04/03/2015   Allergic rhinitis 04/03/2015   Gastro-esophageal reflux disease without esophagitis 04/03/2015   Major depression, recurrent, chronic (HCC) 04/03/2015   Morbid obesity with BMI of 40.0-44.9, adult (HCC) 04/03/2015   Dysmetabolic syndrome 04/03/2015   Numerous moles 04/03/2015   Plantar fasciitis 04/03/2015   PTSD (post-traumatic stress disorder) 04/03/2015   Vitamin D deficiency 04/03/2015   Umbilical hernia without obstruction and without gangrene 04/03/2015    Past Surgical History:  Procedure Laterality Date   CHOLECYSTECTOMY  2006   COLONOSCOPY WITH PROPOFOL N/A 06/05/2022   Procedure: COLONOSCOPY WITH PROPOFOL;  Surgeon: Toney Reil, MD;  Location: [facility] ENDOSCOPY;  Service: Gastroenterology;  Laterality: N/A;   HERNIA REPAIR  10/2014    OB History     Gravida  5   Para  4   Term  4   Preterm  0   AB  1   Living  4      SAB      IAB      Ectopic       Multiple      Live Births               Home Medications    Prior to Admission medications   Medication Sig Start Date End Date Taking? Authorizing Provider  cyclobenzaprine (FLEXERIL) 10 MG tablet Take 1 tablet (10 mg total) by mouth at bedtime. 08/06/23  Yes Lashica Hannay R, NP  predniSONE (STERAPRED UNI-PAK 21 TAB) 10 MG (21) TBPK tablet Take by mouth daily. Take 6 tabs by mouth daily  for 1 days, then 5 tabs for 1 days, then 4 tabs for 1 days, then 3 tabs for 1 days, 2 tabs for 1 days, then 1 tab by mouth daily for 1 days 08/06/23  Yes Tamora Huneke R, NP  amoxicillin-clavulanate (AUGMENTIN) 875-125 MG tablet Take 1 tablet by mouth every 12 (twelve) hours. 04/03/23   Immordino, Jeannett Senior, FNP  benzonatate (TESSALON) 100 MG capsule Take 1-2 tablets 3 times a day as needed for cough 03/28/23   Immordino, Jeannett Senior, FNP  celecoxib (CELEBREX) 100 MG capsule Take 1 capsule (100 mg total) by mouth 2 (two) times daily. 02/14/23   Alba Cory, MD  DULoxetine (CYMBALTA) 60 MG capsule Take 1 capsule (60 mg total) by mouth daily. Take 60 mg dose when done 02/14/23   Alba Cory, MD  fluticasone [facility]) 50 MCG/ACT nasal spray  Place 2 sprays into both nostrils daily. 12/20/22   Alba Cory, MD  QUEtiapine (SEROQUEL) 25 MG tablet Take 1 tablet (25 mg total) by mouth every evening. 02/14/23   Carlynn Purl, Danna Hefty, MD  tirzepatide (ZEPBOUND) 2.5 MG/0.5ML Pen Inject 2.5 mg into the skin once a week. 12/20/22   Alba Cory, MD  tirzepatide (ZEPBOUND) 5 MG/0.5ML Pen Inject 5 mg into the skin once a week. 12/20/22   Alba Cory, MD  tirzepatide (ZEPBOUND) 7.5 MG/0.5ML Pen Inject 7.5 mg into the skin once a week. 12/20/22   Alba Cory, MD  triamterene-hydrochlorothiazide (MAXZIDE-25) 37.5-25 MG tablet Take 1 tablet by mouth daily. 02/14/23   Alba Cory, MD  omeprazole (PRILOSEC) 20 MG capsule Take 1 capsule (20 mg total) by mouth 2 (two) times daily before a meal. 07/25/20 08/30/20  Moshe Cipro, FNP  pregabalin (LYRICA) 50 MG capsule Take 1-3 capsules (50-150 mg total) by mouth at bedtime as needed. 07/18/20 08/30/20  Alba Cory, MD    Family History Family History  Problem Relation Age of Onset   Hypertension Mother    Hypertension Sister    Hypertension Brother     Social History Social History   Tobacco Use   Smoking status: Never   Smokeless tobacco: Never  Vaping Use   Vaping status: Never Used  Substance Use Topics   Alcohol use: No    Alcohol/week: 0.0 standard drinks of alcohol   Drug use: No     Allergies   Ace inhibitors   Review of Systems Review of Systems   Physical Exam Triage Vital Signs ED Triage Vitals [08/06/23 1314]  Encounter Vitals Group     BP 129/85     Systolic BP Percentile      Diastolic BP Percentile      Pulse Rate 64     Resp 18     Temp 98.8 F (37.1 C)     Temp Source Oral     SpO2 94 %     Weight      Height      Head Circumference      Peak Flow      Pain Score      Pain Loc      Pain Education      Exclude from Growth Chart    No data found.  Updated Vital Signs BP 129/85 (BP Location: Left Arm)   Pulse 64   Temp 98.8 F (37.1 C) (Oral)   Resp 18   LMP 10/29/2015   SpO2 94%   Visual Acuity Right Eye Distance:   Left Eye Distance:   Bilateral Distance:    Right Eye Near:   Left Eye Near:    Bilateral Near:     Physical Exam Constitutional:      Appearance: Normal appearance.  Eyes:     Extraocular Movements: Extraocular movements intact.  Pulmonary:     Effort: Pulmonary effort is normal.  Musculoskeletal:     Comments: Tenderness throughout the lumbar region without spinal tenderness noted, able to complete range of motion but pain is elicited with movements, negative straight leg test bilaterally, strength 5 out of 5  Neurological:     Mental Status: She is alert and oriented to person, place, and time. Mental status is at baseline.      UC Treatments / Results   Labs (all labs ordered are listed, but only abnormal results are displayed) Labs Reviewed - No data to display  EKG   Radiology  No results found.  Procedures Procedures (including critical care time)  Medications Ordered in UC Medications  dexamethasone (DECADRON) injection 10 mg (10 mg Intramuscular Given by Other 08/06/23 1348)    Initial Impression / Assessment and Plan / UC Course  I have reviewed the triage vital signs and the nursing notes.  Pertinent labs & imaging results that were available during my care of the patient were reviewed by me and considered in my medical decision making (see chart for details).  Acute bilateral low back pain without sciatica  Etiology muscular his tenderness is located over the latissimus dorsi, discussed this with patient, Decadron IM given and prescribed prednisone and Flexeril for outpatient use recommended RICE, heat massage stretching and activity as tolerated, walking referral given orthopedist if symptoms persist worsen or recur, work note given Final Clinical Impressions(s) / UC Diagnoses   Final diagnoses:  Acute bilateral low back pain without sciatica     Discharge Instructions      Your pain is most likely caused by irritation to the muscles.  You have been given an injection of Decadron which is a steroid to help reduce inflammation ideally will see some improvement in your symptoms in 30 minutes to an hour  Starting tomorrow take prednisone every morning with food as directed to continue the above process, may use Tylenol or any topical medicine in addition to this  You may use muscle relaxant at bedtime as needed for additional comfort, be mindful of this can make you sleepy  You may use heating pad in 15 minute intervals as needed for additional comfort, or you may find comfort in using ice in 10-15 minutes over affected area  Begin massaging and  stretching affected area daily for 10 minutes as tolerated to  further loosen muscles   When sitting and  lying down place pillow underneath and between knees for support  Can try sleeping without pillow on firm mattress   Practice good posture: head back, shoulders back, chest forward, pelvis back and weight distributed evenly on both legs  If pain persist after recommended treatment or reoccurs if may be beneficial to follow up with orthopedic specialist for evaluation, this doctor specializes in the bones and can manage your symptoms long-term with options such as but not limited to imaging, medications or physical therapy      ED Prescriptions     Medication Sig Dispense Auth. Provider   predniSONE (STERAPRED UNI-PAK 21 TAB) 10 MG (21) TBPK tablet Take by mouth daily. Take 6 tabs by mouth daily  for 1 days, then 5 tabs for 1 days, then 4 tabs for 1 days, then 3 tabs for 1 days, 2 tabs for 1 days, then 1 tab by mouth daily for 1 days 21 tablet Traxton Kolenda R, NP   cyclobenzaprine (FLEXERIL) 10 MG tablet Take 1 tablet (10 mg total) by mouth at bedtime. 10 tablet Valinda Hoar, NP      PDMP not reviewed this encounter.   Valinda Hoar, NP 08/06/23 1357

## 2023-08-08 ENCOUNTER — Ambulatory Visit (INDEPENDENT_AMBULATORY_CARE_PROVIDER_SITE_OTHER): Payer: BC Managed Care – PPO | Admitting: Physician Assistant

## 2023-08-08 ENCOUNTER — Encounter: Payer: Self-pay | Admitting: Physician Assistant

## 2023-08-08 VITALS — BP 128/84 | HR 83 | Temp 97.9°F | Resp 16 | Ht 64.0 in | Wt 231.9 lb

## 2023-08-08 DIAGNOSIS — I10 Essential (primary) hypertension: Secondary | ICD-10-CM

## 2023-08-08 DIAGNOSIS — R1033 Periumbilical pain: Secondary | ICD-10-CM

## 2023-08-08 DIAGNOSIS — F419 Anxiety disorder, unspecified: Secondary | ICD-10-CM

## 2023-08-08 DIAGNOSIS — F339 Major depressive disorder, recurrent, unspecified: Secondary | ICD-10-CM | POA: Diagnosis not present

## 2023-08-08 DIAGNOSIS — Z1231 Encounter for screening mammogram for malignant neoplasm of breast: Secondary | ICD-10-CM

## 2023-08-08 DIAGNOSIS — F5105 Insomnia due to other mental disorder: Secondary | ICD-10-CM

## 2023-08-08 DIAGNOSIS — M51379 Other intervertebral disc degeneration, lumbosacral region without mention of lumbar back pain or lower extremity pain: Secondary | ICD-10-CM

## 2023-08-08 DIAGNOSIS — Z8719 Personal history of other diseases of the digestive system: Secondary | ICD-10-CM

## 2023-08-08 DIAGNOSIS — E559 Vitamin D deficiency, unspecified: Secondary | ICD-10-CM

## 2023-08-08 MED ORDER — QUETIAPINE FUMARATE 25 MG PO TABS: 25.0000 mg | ORAL_TABLET | Freq: Every evening | ORAL | 0 refills | Status: DC

## 2023-08-08 MED ORDER — TRIAMTERENE-HCTZ 37.5-25 MG PO TABS: 1.0000 | ORAL_TABLET | Freq: Every day | ORAL | 0 refills | Status: DC

## 2023-08-08 MED ORDER — CELECOXIB 100 MG PO CAPS: 100.0000 mg | ORAL_CAPSULE | Freq: Two times a day (BID) | ORAL | 0 refills | Status: DC

## 2023-08-08 NOTE — Progress Notes (Unsigned)
Established Patient Office Visit  Name: Cheyenne Walker   MRN: [id number]    DOB: 19-Oct-1970   Date:08/11/2023  Today's Provider: Jacquelin Hawking, MHS, PA-C Introduced myself to the patient as a PA-C and provided education on APPs in clinical practice.         Subjective  Chief Complaint  Chief Complaint  Patient presents with   Hypertension   Depression   Anxiety   hernia pain    Pt had a hernia repair a few years ago and once a week recently she has been experiencing some fire pain like if she had "spicy sauce" on her in that same location.    HPI   She reports intermittent pain near her hernia repair site for the past 4-5 months  Reports she had umbilical hernia repair over 10 years ago  She denies bulging or return of hernia to her knowledge She reports pain is random and happens 4-5 times per month  She is not sure if there are triggers- states it feels like a mild burning sensation when it occurs  Reports pain episodes last for most of the day when they hit  Pain level and character: 7/10 and burning in nature  She denies exacerbation with movement or exertion during episodes  She reports some intermittent constipation but denies nausea, vomiting, diarrhea, fevers, chills   HYPERTENSION / HYPERLIPIDEMIA Satisfied with current treatment? yes Duration of hypertension: years BP monitoring frequency: rarely BP range:  BP medication side effects: no Past BP meds: triamterene-hydrochlorothiazide 37.5-25 mg PO every day  Duration of hyperlipidemia: years Cholesterol medication side effects: NA Cholesterol supplements: none Past cholesterol medications: none Medication compliance: NA Aspirin: no Recent stressors: yes Recurrent headaches: no Visual changes: no Palpitations: no Dyspnea: no Chest pain: no Lower extremity edema: no Dizzy/lightheaded: no  Anxiety/Depression She reports her mood has been good  She is not taking Duloxetine anymore- states this  gave her nausea She is still taking Quetiapine 25 mg PO at bedtime to assist with sleep - reports she gets about 8 hours per night       08/08/2023    1:40 PM 02/13/2023    9:22 AM 12/20/2022    7:48 AM 05/13/2022    1:32 PM 09/20/2021    9:23 AM  Depression screen PHQ 2/9  Decreased Interest 0 0 2 2 0  Down, Depressed, Hopeless 0 0 2 2 0  PHQ - 2 Score 0 0 4 4 0  Altered sleeping 0 0 2 3 0  Tired, decreased energy 0 1 2 3  0  Change in appetite 0 0 2 3 0  Feeling bad or failure about yourself  0 0 2 2 0  Trouble concentrating 0 0 0 0 0  Moving slowly or fidgety/restless 0 0 0 0 0  Suicidal thoughts 0 0 0 0 0  PHQ-9 Score 0 1 12 15  0  Difficult doing work/chores Not difficult at all Not difficult at all Somewhat difficult Not difficult at all       08/08/2023    1:40 PM 02/13/2023    9:23 AM 05/13/2022    1:33 PM 07/18/2020    1:36 PM  GAD 7 : Generalized Anxiety Score  Nervous, Anxious, on Edge 0 0 1 0  Control/stop worrying 0 0 1 0  Worry too much - different things 0 0 1 0  Trouble relaxing 0 0 1 0  Restless 0 0 1 0  Easily annoyed or irritable 0 0 1 0  Afraid - awful might happen 0 0 3 0  Total GAD 7 Score 0 0 9 0  Anxiety Difficulty Not difficult at all  Not difficult at all     Patient Active Problem List   Diagnosis Date Noted   Insomnia due to mental disorder 08/11/2023   Periumbilical abdominal pain 08/11/2023   Screening for colon cancer    Adenomatous polyp of transverse colon    Adenomatous polyp of ascending colon    Lumbar radiculopathy (L5) 10/08/2018   Chronic pain syndrome 10/08/2018   DDD (degenerative disc disease), lumbosacral 05/01/2017   Anxiety 04/03/2015   Benign essential HTN 04/03/2015   Allergic rhinitis 04/03/2015   Gastro-esophageal reflux disease without esophagitis 04/03/2015   Major depression, recurrent, chronic (HCC) 04/03/2015   Morbid obesity with BMI of 40.0-44.9, adult (HCC) 04/03/2015   Dysmetabolic syndrome 04/03/2015    Numerous moles 04/03/2015   Plantar fasciitis 04/03/2015   PTSD (post-traumatic stress disorder) 04/03/2015   Vitamin D deficiency 04/03/2015   Umbilical hernia without obstruction and without gangrene 04/03/2015    Past Surgical History:  Procedure Laterality Date   CHOLECYSTECTOMY  2006   COLONOSCOPY WITH PROPOFOL N/A 06/05/2022   Procedure: COLONOSCOPY WITH PROPOFOL;  Surgeon: Toney Reil, MD;  Location: [facility] ENDOSCOPY;  Service: Gastroenterology;  Laterality: N/A;   HERNIA REPAIR  10/2014    Family History  Problem Relation Age of Onset   Hypertension Mother    Hypertension Sister    Hypertension Brother     Social History   Tobacco Use   Smoking status: Never   Smokeless tobacco: Never  Substance Use Topics   Alcohol use: No    Alcohol/week: 0.0 standard drinks of alcohol     Current Outpatient Medications:    cyclobenzaprine (FLEXERIL) 10 MG tablet, Take 1 tablet (10 mg total) by mouth at bedtime., Disp: 10 tablet, Rfl: 0   fluticasone (FLONASE) 50 MCG/ACT nasal spray, Place 2 sprays into both nostrils daily., Disp: 48 g, Rfl: 0   predniSONE (STERAPRED UNI-PAK 21 TAB) 10 MG (21) TBPK tablet, Take by mouth daily. Take 6 tabs by mouth daily  for 1 days, then 5 tabs for 1 days, then 4 tabs for 1 days, then 3 tabs for 1 days, 2 tabs for 1 days, then 1 tab by mouth daily for 1 days, Disp: 21 tablet, Rfl: 0   tirzepatide (ZEPBOUND) 2.5 MG/0.5ML Pen, Inject 2.5 mg into the skin once a week., Disp: 2 mL, Rfl: 0   tirzepatide (ZEPBOUND) 5 MG/0.5ML Pen, Inject 5 mg into the skin once a week., Disp: 2 mL, Rfl: 0   tirzepatide (ZEPBOUND) 7.5 MG/0.5ML Pen, Inject 7.5 mg into the skin once a week., Disp: 2 mL, Rfl: 0   Vitamin D, Ergocalciferol, (DRISDOL) 1.25 MG (50000 UNIT) CAPS capsule, Take 1 capsule (50,000 Units total) by mouth every 7 (seven) days., Disp: 12 capsule, Rfl: 0   celecoxib (CELEBREX) 100 MG capsule, Take 1 capsule (100 mg total) by mouth 2 (two) times  daily., Disp: 60 capsule, Rfl: 0   QUEtiapine (SEROQUEL) 25 MG tablet, Take 1 tablet (25 mg total) by mouth every evening., Disp: 90 tablet, Rfl: 0   triamterene-hydrochlorothiazide (MAXZIDE-25) 37.5-25 MG tablet, Take 1 tablet by mouth daily., Disp: 90 tablet, Rfl: 0  Allergies  Allergen Reactions   Ace Inhibitors Other (See Comments)    Cough. Pt states specifically catopril    I personally reviewed active  problem list, medication list, allergies, health maintenance, notes from last encounter, lab results with the patient/caregiver today.   Review of Systems  Gastrointestinal:  Positive for abdominal pain.      Objective  Vitals:   08/08/23 1340  BP: 128/84  Pulse: 83  Resp: 16  Temp: 97.9 F (36.6 C)  TempSrc: Oral  SpO2: 98%  Weight: 231 lb 14.4 oz (105.2 kg)  Height: 5\' 4"  (1.626 m)    Body mass index is 39.81 kg/m.  Physical Exam Vitals reviewed.  Constitutional:      General: She is awake.     Appearance: Normal appearance. She is well-developed and well-groomed.  HENT:     Head: Normocephalic and atraumatic.  Cardiovascular:     Rate and Rhythm: Normal rate and regular rhythm.  Pulmonary:     Effort: Pulmonary effort is normal.     Breath sounds: No wheezing, rhonchi or rales.  Abdominal:     General: Abdomen is protuberant. A surgical scar is present. Bowel sounds are normal.     Palpations: Abdomen is soft.     Tenderness: There is abdominal tenderness in the periumbilical area.     Hernia: No hernia is present.       Comments: Abdomen was evaluated in supine and upright positions with patient coughing to expose potential hernia  Unable to palpate defect nor did I notice bulging along ventral abdomen or area of concern   Musculoskeletal:     Cervical back: Normal range of motion and neck supple.  Neurological:     Mental Status: She is alert.  Psychiatric:        Mood and Affect: Mood normal.        Behavior: Behavior normal. Behavior is  cooperative.        Thought Content: Thought content normal.        Judgment: Judgment normal.      Recent Results (from the past 2160 hour(s))  Vitamin D (25 hydroxy)     Status: Abnormal   Collection Time: 08/08/23  2:11 PM  Result Value Ref Range   Vit D, 25-Hydroxy 29 (L) 30 - 100 ng/mL    Comment: Vitamin D Status         25-OH Vitamin D: . Deficiency:                    <20 ng/mL Insufficiency:             20 - 29 ng/mL Optimal:                 > or = 30 ng/mL . For 25-OH Vitamin D testing on patients on  D2-supplementation and patients for whom quantitation  of D2 and D3 fractions is required, the QuestAssureD(TM) 25-OH VIT D, (D2,D3), LC/MS/MS is recommended: order  code 69629 (patients >77yrs). . See Note 1 . Note 1 . For additional information, please refer to  http://education.[facility].com/faq/FAQ199  (This link is being provided for informational/ educational purposes only.)   COMPLETE METABOLIC PANEL WITH GFR     Status: None   Collection Time: 08/08/23  2:11 PM  Result Value Ref Range   Glucose, Bld 87 65 - 99 mg/dL    Comment: .            Fasting reference interval .    BUN 25 7 - 25 mg/dL   Creat 5.28 4.13 - 2.44 mg/dL   eGFR 010 > OR = 60 UV/OZD/6.64Q0  BUN/Creatinine Ratio SEE NOTE: 6 - 22 (calc)    Comment:    Not Reported: BUN and Creatinine are within    reference range. .    Sodium 141 135 - 146 mmol/L    Comment: Verified by repeat analysis. .    Potassium 4.1 3.5 - 5.3 mmol/L   Chloride 104 98 - 110 mmol/L    Comment: Verified by repeat analysis. .    CO2 25 20 - 32 mmol/L   Calcium 9.4 8.6 - 10.4 mg/dL   Total Protein 7.5 6.1 - 8.1 g/dL   Albumin 4.5 3.6 - 5.1 g/dL   Globulin 3.0 1.9 - 3.7 g/dL (calc)   AG Ratio 1.5 1.0 - 2.5 (calc)   Total Bilirubin 0.3 0.2 - 1.2 mg/dL   Alkaline phosphatase (APISO) 75 37 - 153 U/L   AST 17 10 - 35 U/L   ALT 19 6 - 29 U/L  CBC w/Diff/Platelet     Status: Abnormal   Collection  Time: 08/08/23  2:11 PM  Result Value Ref Range   WBC 9.8 3.8 - 10.8 Thousand/uL   RBC 4.89 3.80 - 5.10 Million/uL   Hemoglobin 14.3 11.7 - 15.5 g/dL   HCT 16.1 09.6 - 04.5 %   MCV 89.6 80.0 - 100.0 fL   MCH 29.2 27.0 - 33.0 pg   MCHC 32.6 32.0 - 36.0 g/dL    Comment: For adults, a slight decrease in the calculated MCHC value (in the range of 30 to 32 g/dL) is most likely not clinically significant; however, it should be interpreted with caution in correlation with other red cell parameters and the patient's clinical condition.    RDW 13.2 11.0 - 15.0 %   Platelets 406 (H) 140 - 400 Thousand/uL   MPV 10.5 7.5 - 12.5 fL   Neutro Abs 3,665 1,500 - 7,800 cells/uL   Absolute Lymphocytes 5,165 (H) 850 - 3,900 cells/uL   Absolute Monocytes 666 200 - 950 cells/uL   Eosinophils Absolute 216 15 - 500 cells/uL   Basophils Absolute 88 0 - 200 cells/uL   Neutrophils Relative % 37.4 %   Total Lymphocyte 52.7 %   Monocytes Relative 6.8 %   Eosinophils Relative 2.2 %   Basophils Relative 0.9 %  Lipid Profile     Status: Abnormal   Collection Time: 08/08/23  2:11 PM  Result Value Ref Range   Cholesterol 205 (H) <200 mg/dL   HDL 62 > OR = 50 mg/dL   Triglycerides 409 (H) <150 mg/dL   LDL Cholesterol (Calc) 116 (H) mg/dL (calc)    Comment: Reference range: <100 . Desirable range <100 mg/dL for primary prevention;   <70 mg/dL for patients with CHD or diabetic patients  with > or = 2 CHD risk factors. Marland Kitchen LDL-C is now calculated using the Martin-Hopkins  calculation, which is a validated novel method providing  better accuracy than the Friedewald equation in the  estimation of LDL-C.  Horald Pollen et al. Lenox Ahr. 8119;147(82): 2061-2068  (http://education.[facility].com/faq/FAQ164)    Total CHOL/HDL Ratio 3.3 <5.0 (calc)   Non-HDL Cholesterol (Calc) 143 (H) <130 mg/dL (calc)    Comment: For patients with diabetes plus 1 major ASCVD risk  factor, treating to a non-HDL-C goal of <100  mg/dL  (LDL-C of <95 mg/dL) is considered a therapeutic  option.      PHQ2/9:    08/08/2023    1:40 PM 02/13/2023    9:22 AM 12/20/2022    7:48 AM 05/13/2022  1:32 PM 09/20/2021    9:23 AM  Depression screen PHQ 2/9  Decreased Interest 0 0 2 2 0  Down, Depressed, Hopeless 0 0 2 2 0  PHQ - 2 Score 0 0 4 4 0  Altered sleeping 0 0 2 3 0  Tired, decreased energy 0 1 2 3  0  Change in appetite 0 0 2 3 0  Feeling bad or failure about yourself  0 0 2 2 0  Trouble concentrating 0 0 0 0 0  Moving slowly or fidgety/restless 0 0 0 0 0  Suicidal thoughts 0 0 0 0 0  PHQ-9 Score 0 1 12 15  0  Difficult doing work/chores Not difficult at all Not difficult at all Somewhat difficult Not difficult at all       Fall Risk:    08/08/2023    1:33 PM 02/13/2023    9:22 AM 12/20/2022    7:48 AM 05/13/2022    1:32 PM 09/20/2021    9:23 AM  Fall Risk   Falls in the past year? 0 0 0 1 1  Number falls in past yr: 0  0 0 0  Injury with Fall? 0  0 0 0  Risk for fall due to : No Fall Risks No Fall Risks No Fall Risks History of fall(s) No Fall Risks  Follow up Falls prevention discussed;Education provided;Falls evaluation completed Falls prevention discussed;Education provided;Falls evaluation completed Falls prevention discussed;Education provided;Falls evaluation completed Falls prevention discussed;Education provided;Falls evaluation completed Falls prevention discussed      Functional Status Survey: Is the patient deaf or have difficulty hearing?: No Does the patient have difficulty seeing, even when wearing glasses/contacts?: No Does the patient have difficulty concentrating, remembering, or making decisions?: No Does the patient have difficulty walking or climbing stairs?: No Does the patient have difficulty dressing or bathing?: No Does the patient have difficulty doing errands alone such as visiting a doctor's office or shopping?: No    Assessment & Plan  Problem List Items Addressed  This Visit       Cardiovascular and Mediastinum   Benign essential HTN - Primary    Chronic, historic condition Appears well-controlled on current regimen comprised of triamterene-hydrochlorothiazide 37.5-25 mg PO every day  Continue current regimen Follow-up in 6 months or sooner if concerns arise       Relevant Medications   triamterene-hydrochlorothiazide (MAXZIDE-25) 37.5-25 MG tablet   Other Relevant Orders   COMPLETE METABOLIC PANEL WITH GFR (Completed)   CBC w/Diff/Platelet (Completed)   Lipid Profile (Completed)     Musculoskeletal and Integument   DDD (degenerative disc disease), lumbosacral    Chronic, stable Refills provided today      Relevant Medications   celecoxib (CELEBREX) 100 MG capsule     Other   Anxiety    Chronic, historic condition Appears well-controlled at this time.  Reviewed PHQ-9 and GAD-7 scores with her today-both 0 She states that she is no longer taking duloxetine as this gave her nausea She is still taking quetiapine 25 mg p.o. nightly Given good control on screenings and per patient report recommend that she continues with current regimen Follow-up in 6 months or sooner if concerns arise      Major depression, recurrent, chronic (HCC)    Chronic, historic condition, appears to be in remission at this time She reports that she has no other taking an antidepressant but is only using Seroquel to assist with sleep PHQ-9 and GAD-7 were reviewed with her today-both were 0  Given symptom resolution and patient satisfaction recommend that she continues with current regimen Follow-up in 6 months or sooner if concerns arise      Vitamin D deficiency    Recheck labs Results to dictate further management      Relevant Medications   Vitamin D, Ergocalciferol, (DRISDOL) 1.25 MG (50000 UNIT) CAPS capsule   Other Relevant Orders   Vitamin D (25 hydroxy) (Completed)   Insomnia due to mental disorder    Chronic, historic condition She reports  satisfaction with current management comprised of Seroquel 25 mg p.o. nightly She reports that she is getting about 8 hours per night and feels overall well rested in the morning Continue current regimen Follow-up in about 6 months or sooner if concerns arise      Relevant Medications   QUEtiapine (SEROQUEL) 25 MG tablet   Periumbilical abdominal pain    Chronic per patient, ongoing Patient reports periumbilical pain for the last 4 to 5 months.  She states that the pain is episodic and occurs about 4-5 times per month but she is unsure of any triggers.  She reports burning sensation in the area and states when she has an episode usually last for most of the day She reports history of previous umbilical hernia that underwent repair over 10 years ago During physical exam I am unable to feel a defect in the umbilical region or periumbilical region while patient was standing and in supine position.  Protrusion or bulging was not appreciated even with coughing. Will order ultrasound of the abdomen for rule out as well as place referral to general surgery for consultation Results of imaging to dictate further management       Relevant Orders   Ambulatory referral to General Surgery   Other Visit Diagnoses     Encounter for screening mammogram for malignant neoplasm of breast       Relevant Orders   MM 3D SCREENING MAMMOGRAM BILATERAL BREAST   History of umbilical hernia       Relevant Orders   Ambulatory referral to General Surgery   US Abdomen Limited        Return in about 6 months (around 02/05/2024) for HTN, dislipidemia, Depression, anxiety.   I, Jyaire Koudelka E Donnelle Olmeda, PA-C, have reviewed all documentation for this visit. The documentation on 08/11/23 for the exam, diagnosis, procedures, and orders are all accurate and complete.   Jacquelin Hawking, MHS, PA-C [facility]

## 2023-08-09 LAB — LIPID PANEL
Cholesterol: 205 mg/dL — ABNORMAL HIGH (ref <200)
HDL: 62 mg/dL (ref >=50)
LDL Cholesterol (Calc): 116 mg/dL — ABNORMAL HIGH
Non-HDL Cholesterol (Calc): 143 mg/dL — ABNORMAL HIGH (ref <130)
Total CHOL/HDL Ratio: 3.3 (calc) (ref <5.0)
Triglycerides: 154 mg/dL — ABNORMAL HIGH (ref <150)

## 2023-08-09 LAB — COMPLETE METABOLIC PANEL WITH GFR
AG Ratio: 1.5 (calc) (ref 1.0–2.5)
ALT: 19 U/L (ref 6–29)
AST: 17 U/L (ref 10–35)
Albumin: 4.5 g/dL (ref 3.6–5.1)
Alkaline phosphatase (APISO): 75 U/L (ref 37–153)
BUN: 25 mg/dL (ref 7–25)
CO2: 25 mmol/L (ref 20–32)
Calcium: 9.4 mg/dL (ref 8.6–10.4)
Chloride: 104 mmol/L (ref 98–110)
Creat: 0.68 mg/dL (ref 0.50–1.03)
Globulin: 3 g/dL (ref 1.9–3.7)
Glucose, Bld: 87 mg/dL (ref 65–99)
Potassium: 4.1 mmol/L (ref 3.5–5.3)
Sodium: 141 mmol/L (ref 135–146)
Total Bilirubin: 0.3 mg/dL (ref 0.2–1.2)
Total Protein: 7.5 g/dL (ref 6.1–8.1)
eGFR: 106 mL/min/{1.73_m2} (ref >=60)

## 2023-08-09 LAB — CBC WITH DIFFERENTIAL/PLATELET
Absolute Lymphocytes: 5165 {cells}/uL — ABNORMAL HIGH (ref 850–3900)
Absolute Monocytes: 666 {cells}/uL (ref 200–950)
Basophils Absolute: 88 {cells}/uL (ref 0–200)
Basophils Relative: 0.9 %
Eosinophils Absolute: 216 {cells}/uL (ref 15–500)
Eosinophils Relative: 2.2 %
HCT: 43.8 % (ref 35.0–45.0)
Hemoglobin: 14.3 g/dL (ref 11.7–15.5)
MCH: 29.2 pg (ref 27.0–33.0)
MCHC: 32.6 g/dL (ref 32.0–36.0)
MCV: 89.6 fL (ref 80.0–100.0)
MPV: 10.5 fL (ref 7.5–12.5)
Monocytes Relative: 6.8 %
Neutro Abs: 3665 {cells}/uL (ref 1500–7800)
Neutrophils Relative %: 37.4 %
Platelets: 406 10*3/uL — ABNORMAL HIGH (ref 140–400)
RBC: 4.89 10*6/uL (ref 3.80–5.10)
RDW: 13.2 % (ref 11.0–15.0)
Total Lymphocyte: 52.7 %
WBC: 9.8 10*3/uL (ref 3.8–10.8)

## 2023-08-09 LAB — VITAMIN D 25 HYDROXY (VIT D DEFICIENCY, FRACTURES): Vit D, 25-Hydroxy: 29 ng/mL — ABNORMAL LOW (ref 30–100)

## 2023-08-11 DIAGNOSIS — R1033 Periumbilical pain: Secondary | ICD-10-CM | POA: Insufficient documentation

## 2023-08-11 DIAGNOSIS — F5105 Insomnia due to other mental disorder: Secondary | ICD-10-CM | POA: Insufficient documentation

## 2023-08-11 MED ORDER — VITAMIN D (ERGOCALCIFEROL) 1.25 MG (50000 UNIT) PO CAPS: 50000.0000 [IU] | ORAL_CAPSULE | ORAL | 0 refills | Status: DC

## 2023-08-11 NOTE — Assessment & Plan Note (Signed)
Chronic, historic condition Appears well-controlled at this time.  Reviewed PHQ-9 and GAD-7 scores with her today-both 0 She states that she is no longer taking duloxetine as this gave her nausea She is still taking quetiapine 25 mg p.o. nightly Given good control on screenings and per patient report recommend that she continues with current regimen Follow-up in 6 months or sooner if concerns arise

## 2023-08-11 NOTE — Assessment & Plan Note (Signed)
Chronic per patient, ongoing Patient reports periumbilical pain for the last 4 to 5 months.  She states that the pain is episodic and occurs about 4-5 times per month but she is unsure of any triggers.  She reports burning sensation in the area and states when she has an episode usually last for most of the day She reports history of previous umbilical hernia that underwent repair over 10 years ago During physical exam I am unable to feel a defect in the umbilical region or periumbilical region while patient was standing and in supine position.  Protrusion or bulging was not appreciated even with coughing. Will order ultrasound of the abdomen for rule out as well as place referral to general surgery for consultation Results of imaging to dictate further management

## 2023-08-11 NOTE — Assessment & Plan Note (Signed)
Recheck labs Results to dictate further management  

## 2023-08-11 NOTE — Assessment & Plan Note (Signed)
Chronic, stable Refills provided today

## 2023-08-11 NOTE — Assessment & Plan Note (Signed)
Chronic, historic condition, appears to be in remission at this time She reports that she has no other taking an antidepressant but is only using Seroquel to assist with sleep PHQ-9 and GAD-7 were reviewed with her today-both were 0 Given symptom resolution and patient satisfaction recommend that she continues with current regimen Follow-up in 6 months or sooner if concerns arise

## 2023-08-11 NOTE — Assessment & Plan Note (Signed)
Chronic, historic condition Appears well-controlled on current regimen comprised of triamterene-hydrochlorothiazide 37.5-25 mg PO every day  Continue current regimen Follow-up in 6 months or sooner if concerns arise

## 2023-08-11 NOTE — Progress Notes (Signed)
Your labs are back Your vitamin D is still low.  I have sent in a prescription for vitamin D supplement for you to take once per week for the next 12 weeks to help improve this.  We should recheck in about 3 months to make sure that your levels are improving Your electrolytes, liver and kidney function are in normal ranges Your cholesterol is still high but appears to have improved since it was checked about 5 months ago.  I would recommend starting a cholesterol medication at this time to assist with managing it.  Please let us know if you would like to start this and I will send a prescription in. CBC is overall normal, no signs of anemia

## 2023-08-11 NOTE — Assessment & Plan Note (Signed)
Chronic, historic condition She reports satisfaction with current management comprised of Seroquel 25 mg p.o. nightly She reports that she is getting about 8 hours per night and feels overall well rested in the morning Continue current regimen Follow-up in about 6 months or sooner if concerns arise

## 2023-08-14 ENCOUNTER — Other Ambulatory Visit: Payer: Self-pay | Admitting: Family Medicine

## 2023-08-14 DIAGNOSIS — F339 Major depressive disorder, recurrent, unspecified: Secondary | ICD-10-CM

## 2023-08-18 ENCOUNTER — Ambulatory Visit
Admission: RE | Admit: 2023-08-18 | Discharge: 2023-08-18 | Disposition: A | Discharge status: Home / Self Care | Payer: BC Managed Care – PPO | Source: Ambulatory Visit | Attending: Physician Assistant | Admitting: Physician Assistant

## 2023-08-18 DIAGNOSIS — Z8719 Personal history of other diseases of the digestive system: Secondary | ICD-10-CM

## 2023-08-19 ENCOUNTER — Ambulatory Visit (INDEPENDENT_AMBULATORY_CARE_PROVIDER_SITE_OTHER): Payer: BC Managed Care – PPO | Admitting: General Surgery

## 2023-08-19 ENCOUNTER — Encounter: Payer: Self-pay | Admitting: General Surgery

## 2023-08-19 VITALS — BP 126/73 | HR 69 | Temp 98.0°F | Ht 64.0 in | Wt 231.0 lb

## 2023-08-19 DIAGNOSIS — K429 Umbilical hernia without obstruction or gangrene: Secondary | ICD-10-CM | POA: Diagnosis not present

## 2023-08-19 NOTE — Progress Notes (Signed)
Patient ID: Cheyenne Walker, female   DOB: 1971/06/14, 52 y.o.   MRN: [id number] CC: Peri-Umbilical Pain History of Present Illness Cheyenne Walker is a 52 y.o. female with medical history significant for obesity who presents in evaluation for buccal pain.  The patient reports that over the last year she has had intermittent sharp pain right at her umbilicus.  She says this happens approximately 2-3 times a month.  She reports that it is worse after she eats spicy food and feels like a hot pain.  She denies noticing any bulges or skin changes at the area of her previous umbilical hernia repair.  She denies any skin changes or drainage from her umbilicus..  Past Medical History Past Medical History:  Diagnosis Date   ADD (attention deficit disorder)    Hypertension    Vitamin D deficiency      Past Surgical History:  Procedure Laterality Date   CHOLECYSTECTOMY  2006   COLONOSCOPY WITH PROPOFOL N/A 06/05/2022   Procedure: COLONOSCOPY WITH PROPOFOL;  Surgeon: Toney Reil, MD;  Location: [facility] ENDOSCOPY;  Service: Gastroenterology;  Laterality: N/A;   HERNIA REPAIR  10/2014    Allergies  Allergen Reactions   Ace Inhibitors Other (See Comments)    Cough. Pt states specifically catopril    Current Outpatient Medications  Medication Sig Dispense Refill   celecoxib (CELEBREX) 100 MG capsule Take 1 capsule (100 mg total) by mouth 2 (two) times daily. 60 capsule 0   cyclobenzaprine (FLEXERIL) 10 MG tablet Take 1 tablet (10 mg total) by mouth at bedtime. 10 tablet 0   fluticasone (FLONASE) 50 MCG/ACT nasal spray Place 2 sprays into both nostrils daily. 48 g 0   predniSONE (STERAPRED UNI-PAK 21 TAB) 10 MG (21) TBPK tablet Take by mouth daily. Take 6 tabs by mouth daily  for 1 days, then 5 tabs for 1 days, then 4 tabs for 1 days, then 3 tabs for 1 days, 2 tabs for 1 days, then 1 tab by mouth daily for 1 days 21 tablet 0   QUEtiapine (SEROQUEL) 25 MG tablet Take 1 tablet (25 mg total) by mouth  every evening. 90 tablet 0   tirzepatide (ZEPBOUND) 2.5 MG/0.5ML Pen Inject 2.5 mg into the skin once a week. 2 mL 0   tirzepatide (ZEPBOUND) 5 MG/0.5ML Pen Inject 5 mg into the skin once a week. 2 mL 0   tirzepatide (ZEPBOUND) 7.5 MG/0.5ML Pen Inject 7.5 mg into the skin once a week. 2 mL 0   triamterene-hydrochlorothiazide (MAXZIDE-25) 37.5-25 MG tablet Take 1 tablet by mouth daily. 90 tablet 0   Vitamin D, Ergocalciferol, (DRISDOL) 1.25 MG (50000 UNIT) CAPS capsule Take 1 capsule (50,000 Units total) by mouth every 7 (seven) days. 12 capsule 0   No current facility-administered medications for this visit.    Family History Family History  Problem Relation Age of Onset   Hypertension Mother    Hypertension Sister    Hypertension Brother       Social History Social History   Tobacco Use   Smoking status: Never    Passive exposure: Never   Smokeless tobacco: Never  Vaping Use   Vaping status: Never Used  Substance Use Topics   Alcohol use: No    Alcohol/week: 0.0 standard drinks of alcohol   Drug use: No      ROS Full ROS of systems performed and is otherwise negative there than what is stated in the HPI  Physical Exam Blood pressure 126/73,  pulse 69, temperature 98 F (36.7 C), height 5\' 4"  (1.626 m), weight 231 lb (104.8 kg), last menstrual period 10/29/2015, SpO2 97%.  No acute distress, normal work of breathing on room air, regular rate and rhythm, moving all extremities spontaneously, mood and affect appropriate Abdomen is soft, obese.  In palpation of her umbilicus I can feel the mesh.  There does feel to be a very small hernia defect at the center of the mesh.  This does bulge slightly when she has contraction of her abdominal wall musculature.  The defect is reducible Data Reviewed I have reviewed her ultrasound.  The ultrasound does not read but it does look like there may be a small hernia defect with fat in the hernia.  I will follow-up on final read  I have  personally reviewed the patient's imaging and medical records.    Assessment    [name] is a 52 year old who has had abdominal pain intermittently in the last year after having an hernia repair with mesh.  Plan    I discussed with the patient that she may have a small hernia recurrence.  This is what it feels like on exam although her body habitus limits her exam.  She does have an ultrasound that I think shows recurrence of the hernia but the final read has not been done.  Regardless, her BMI is 40 so I am concerned that any elective repair would result in recurrence.  I discussed with her that I would like her to lose some weight before we entertain any elective repair options fascially given that she only has pain 2-3 times a month.  We will plan to see her again in 3 months and if she is losing weight at a time then we can talk about scheduling repair.    Kandis Cocking 08/19/2023, 11:30 AM

## 2023-08-19 NOTE — Patient Instructions (Signed)
We will have you follow up here in 3 months.   Please call and ask to speak with a nurse if you develop questions or concerns.  Hernia, Adult     A hernia happens when an organ or tissue inside your body pushes out through a weak spot in the muscles of your belly (abdomen). This makes a bulge. The bulge may be: In a scar from a surgery that was done in your belly (incisional hernia). Near your belly button (umbilical hernia). In your groin (inguinal hernia). Your groin is the area where your leg meets your lower belly. If you are a female, this type could also be in your scrotum. In your upper thigh (femoral hernia). Inside your belly (hiatal hernia). This happens when your stomach slides above the muscle between your belly and your chest (diaphragm). What are the causes? This condition may be caused by: Lifting heavy things. Coughing over a long period of time. Having trouble pooping (constipation). Trouble pooping can lead to straining. A cut from surgery in your belly. A physical problem that is present at birth. Being very overweight. Smoking. Too much fluid in your belly. A testicle that has not moved down into the scrotum, in males. What are the signs or symptoms? The main symptom is a bulge in the area of the hernia, but a bulge may not always be seen. It may grow bigger or be easier to see when you cough or strain (such as when lifting something heavy). A hernia that can be pushed back into the belly rarely causes pain. A hernia that cannot be pushed back into the belly may lose its blood supply. This may cause: Pain. Fever. A feeling like you may vomit, and vomiting. Swelling. Trouble pooping. How is this treated? A hernia that is small and painless may not need to be treated. A hernia that is large or painful may be treated with surgery. Surgery to treat a hernia involves pushing the bulge back into place and repairing the weak area of the muscle or belly. Follow these  instructions at home: Activity Avoid straining the muscles near your hernia. This can happen when you: Lift something heavy. Poop (have a bowel movement). Do not lift anything that is heavier than 10 lb (4.5 kg), or the limit that you are told. When you lift something heavy, use your leg muscles. Do not use your back muscles to lift. Prevent trouble pooping If told by your doctor, take steps to prevent trouble pooping. You may need to: Drink enough fluid to keep your pee (urine) pale yellow. Take medicines. You will be told what medicines to take. Eat foods that are high in fiber. These include beans, whole grains, and fresh fruits and vegetables. Limit foods that are high in fat and sugar. These include fried or sweet foods. General instructions When you cough, try to cough gently. You may try to push your hernia back in by gently pressing on it when you are lying down. Do not try to force the bulge back in if it will not go in easily. If you are overweight, work with your doctor to lose weight safely. Do not smoke or use any products that contain nicotine or tobacco. If you need help quitting, ask your doctor. If you will be having surgery, watch your hernia for changes in shape, size, or color. Tell your doctor if you see any changes. Take over-the-counter and prescription medicines only as told by your doctor. Keep all follow-up visits. Contact  a doctor if: You get new pain, swelling, or redness near your hernia. You poop fewer times in a week than normal. You have trouble pooping. You have poop that is more dry than normal. You have poop that is harder or larger than normal. Get help right away if: You have a fever or chills. You have belly pain that gets worse. You feel like you may vomit, or you vomit. Your hernia cannot be pushed in by gently pressing on it when you are lying down. Your hernia: Changes in shape or size. Changes color. Feels hard, or it hurts when you touch  it. These symptoms may be an emergency. Get help right away. Call your local emergency services (911 in the [place] Do not wait to see if the symptoms will go away. Do not drive yourself to the hospital. Summary A hernia happens when an organ or tissue inside your body pushes out through a weak spot in the belly muscles. This creates a bulge. If your hernia is small and it does not hurt, you may not need treatment. If your hernia is large or it hurts, you may need surgery. If you will be having surgery, watch your hernia for changes in shape, size, or color. Tell your doctor about any changes. This information is not intended to replace advice given to you by your health care provider. Make sure you discuss any questions you have with your health care provider. Document Revised: 04/17/2020 Document Reviewed: 04/17/2020 Elsevier Patient Education  2024 [employer].

## 2023-08-20 NOTE — Progress Notes (Signed)
There is potential umbilical abdominal defect that may correspond with previous hernia repair.  She has had appointment with general surgery for evaluation.  Will defer to their recommendations at this time

## 2023-09-06 ENCOUNTER — Other Ambulatory Visit: Payer: Self-pay | Admitting: Physician Assistant

## 2023-09-06 DIAGNOSIS — M51379 Other intervertebral disc degeneration, lumbosacral region without mention of lumbar back pain or lower extremity pain: Secondary | ICD-10-CM

## 2023-09-08 NOTE — Telephone Encounter (Signed)
Requested medication (s) are due for refill today: yes  Requested medication (s) are on the active medication list: yes  Last refill:  08/08/23 #60 0 refills  Future visit scheduled: yes in 2 months.   Notes to clinic:  last ordered by E. Mecum. PA no refills remain. Do you want to / refill Rx?     Requested Prescriptions  Pending Prescriptions Disp Refills   celecoxib (CELEBREX) 100 MG capsule [Pharmacy Med Name: CELECOXIB 100 MG CAPSULE] 60 capsule 0    Sig: TAKE 1 CAPSULE BY MOUTH TWICE A DAY     Analgesics:  COX2 Inhibitors Failed - 09/08/2023  8:52 AM      Failed - Manual Review: Labs are only required if the patient has taken medication for more than 8 weeks.      Passed - HGB in normal range and within 360 days    Hemoglobin  Date Value Ref Range Status  08/08/2023 14.3 11.7 - 15.5 g/dL Final  69/62/9528 41.3 11.1 - 15.9 g/dL Final         Passed - Cr in normal range and within 360 days    Creat  Date Value Ref Range Status  08/08/2023 0.68 0.50 - 1.03 mg/dL Final         Passed - HCT in normal range and within 360 days    HCT  Date Value Ref Range Status  08/08/2023 43.8 35.0 - 45.0 % Final   Hematocrit  Date Value Ref Range Status  07/17/2020 42.1 34.0 - 46.6 % Final         Passed - AST in normal range and within 360 days    AST  Date Value Ref Range Status  08/08/2023 17 10 - 35 U/L Final   SGOT(AST)  Date Value Ref Range Status  10/28/2014 27 15 - 37 Unit/L Final         Passed - ALT in normal range and within 360 days    ALT  Date Value Ref Range Status  08/08/2023 19 6 - 29 U/L Final   SGPT (ALT)  Date Value Ref Range Status  10/28/2014 27 14 - 63 U/L Final         Passed - eGFR is 30 or above and within 360 days    GFR, Est African American  Date Value Ref Range Status  12/16/2018 87 > OR = 60 mL/min/1.74m2 Final   GFR calc Af Amer  Date Value Ref Range Status  07/18/2020 125  Final   GFR, Est Non African American  Date Value  Ref Range Status  12/16/2018 75 > OR = 60 mL/min/1.66m2 Final   GFR, Estimated  Date Value Ref Range Status  08/31/2022 >60 >60 mL/min Final    Comment:    (NOTE) Calculated using the CKD-EPI Creatinine Equation (2021)    eGFR  Date Value Ref Range Status  08/08/2023 106 > OR = 60 mL/min/1.58m2 Final         Passed - Patient is not pregnant      Passed - Valid encounter within last 12 months    Recent Outpatient Visits           1 month ago Benign essential HTN   [facility] Mecum, Oswaldo Conroy, PA-C   6 months ago Well adult exam   [facility] Alba Cory, MD   8 months ago Morbid obesity [facility])   [facility] Alba Cory, MD  1 year ago Benign essential HTN   [facility] Alba Cory, MD   1 year ago Morbid obesity [facility])   [facility] Alba Cory, MD       Future Appointments             In 2 months Carlynn Purl, Danna Hefty, MD [facility], [facility]

## 2023-11-04 ENCOUNTER — Other Ambulatory Visit: Payer: Self-pay | Admitting: Physician Assistant

## 2023-11-04 DIAGNOSIS — I10 Essential (primary) hypertension: Secondary | ICD-10-CM

## 2023-11-04 NOTE — Telephone Encounter (Signed)
Requested Prescriptions  Pending Prescriptions Disp Refills   triamterene-hydrochlorothiazide (MAXZIDE-25) 37.5-25 MG tablet [Pharmacy Med Name: TRIAMTERENE-HCTZ 37.5-25 MG TB] 90 tablet 0    Sig: TAKE 1 TABLET BY MOUTH EVERY DAY     Cardiovascular: Diuretic Combos Passed - 11/04/2023  4:04 PM      Passed - K in normal range and within 180 days    Potassium  Date Value Ref Range Status  08/08/2023 4.1 3.5 - 5.3 mmol/L Final  10/28/2014 3.2 (L) 3.5 - 5.1 mmol/L Final         Passed - Na in normal range and within 180 days    Sodium  Date Value Ref Range Status  08/08/2023 141 135 - 146 mmol/L Final    Comment:    Verified by repeat analysis. .   07/18/2020 139 137 - 147 Final  10/28/2014 141 136 - 145 mmol/L Final         Passed - Cr in normal range and within 180 days    Creat  Date Value Ref Range Status  08/08/2023 0.68 0.50 - 1.03 mg/dL Final         Passed - Last BP in normal range    BP Readings from Last 1 Encounters:  08/19/23 126/73         Passed - Valid encounter within last 6 months    Recent Outpatient Visits           2 months ago Benign essential HTN   [facility] Mecum, Oswaldo Conroy, PA-C   8 months ago Well adult exam   [facility] Alba Cory, MD   10 months ago Morbid obesity [facility])   [place] Alba Cory, MD   1 year ago Benign essential HTN   [facility] Alba Cory, MD   2 years ago Morbid obesity [facility])   [facility] Alba Cory, MD       Future Appointments             In 1 week Alba Cory, MD [facility], [facility]

## 2023-11-12 ENCOUNTER — Other Ambulatory Visit: Payer: Self-pay | Admitting: Physician Assistant

## 2023-11-12 ENCOUNTER — Ambulatory Visit: Payer: BC Managed Care – PPO | Admitting: Family Medicine

## 2023-11-12 DIAGNOSIS — F5105 Insomnia due to other mental disorder: Secondary | ICD-10-CM

## 2023-11-20 ENCOUNTER — Ambulatory Visit: Payer: BC Managed Care – PPO | Admitting: General Surgery

## 2023-12-04 ENCOUNTER — Encounter: Payer: Self-pay | Admitting: General Surgery

## 2023-12-04 ENCOUNTER — Ambulatory Visit (INDEPENDENT_AMBULATORY_CARE_PROVIDER_SITE_OTHER): Payer: BC Managed Care – PPO | Admitting: General Surgery

## 2023-12-04 VITALS — BP 124/79 | HR 64 | Temp 98.2°F | Ht 64.0 in | Wt 235.0 lb

## 2023-12-04 DIAGNOSIS — K429 Umbilical hernia without obstruction or gangrene: Secondary | ICD-10-CM

## 2023-12-04 NOTE — Progress Notes (Signed)
 Outpatient Surgical Follow Up  12/04/2023  Cheyenne Walker is an 53 y.o. female.   Chief Complaint  Patient presents with   Follow-up    HPI: The patient returns today in consultation for umbilical hernia.  She reports that she has had very minimal pain at her umbilicus.  When she saw me previously she has had a happen 2-3 times a month and now she says it may happen every other month.  She describes the pain as sharp.  She does notice a bulge when she has the pain.  She has had no signs of obstruction including no nausea or vomiting and no obstipation.  She did have an ultrasound that showed a possible 0.9 cm defect.  She has been trying to lose weight but has been struggling with this.  She says that she tried to use a GLP-1 agonist but it was difficult to get supply of.  She reports that she has difficulty with overeating.  Past Medical History:  Diagnosis Date   ADD (attention deficit disorder)    Hypertension    Vitamin D deficiency     Past Surgical History:  Procedure Laterality Date   CHOLECYSTECTOMY  2006   COLONOSCOPY WITH PROPOFOL N/A 06/05/2022   Procedure: COLONOSCOPY WITH PROPOFOL;  Surgeon: Toney Reil, MD;  Location: [facility] ENDOSCOPY;  Service: Gastroenterology;  Laterality: N/A;   HERNIA REPAIR  10/2014    Family History  Problem Relation Age of Onset   Hypertension Mother    Hypertension Sister    Hypertension Brother     Social History:  reports that she has never smoked. She has never been exposed to tobacco smoke. She has never used smokeless tobacco. She reports that she does not drink alcohol and does not use drugs.  Allergies:  Allergies  Allergen Reactions   Ace Inhibitors Other (See Comments)    Cough. Pt states specifically catopril    Medications reviewed.    ROS Full ROS performed and is otherwise negative other than what is stated in HPI   BP 124/79   Pulse 64   Temp 98.2 F (36.8 C)   Ht 5\' 4"  (1.626 m)   Wt 235 lb (106.6 kg)    LMP 10/29/2015   SpO2 96%   BMI 40.34 kg/m   Physical Exam No acute distress, alert and oriented x 3 COVID work of breathing on room air, abdomen is soft, obese, right periumbilical incision is well-healed.  There feels to be a possibly very small defect right in the center of her umbilicus that is less then the width of my small finger.  On sit up I do not feel any bulge at this area.   I reviewed her ultrasound and there does appear to be an area of defect that was measured approximately 0.9 cm.  Assessment/Plan:  The patient is a 53 year old with his umbilical hernia, recurrent.  She is having less pain from this.  She also is obese with a BMI of over 40.  She is going to see her primary care physician next week and will discuss with them about weight loss options whether it be programmatic or pharmacologic.  Given that she is having very minimal symptoms from her umbilicus and her obesity I think it is prudent to not pursue repair at this time.  We will plan to see her again in 6 months to see if she has lost weight and to see if there are more symptoms from her hernia.  Total  time spent reviewing imaging, past notes, performing updated history and physical and discussing treatment options with patient is 35 minutes.   Baker Pierini, M.D. [facility]

## 2023-12-04 NOTE — Patient Instructions (Signed)
 Follow up here in 6 months. We will call you about this appointment.   Umbilical Hernia, Adult  A hernia is a lump of tissue that pushes through an opening in the muscles. An umbilical hernia happens in the belly, near the belly button. The hernia may contain tissues from the small or large intestine. It may also have fatty tissue that covers the intestines. Umbilical hernias in adults may get worse over time. They need to be treated with surgery. There are several types of umbilical hernias. They include: Indirect hernia. This occurs just above or below the belly button. It's the most common type of umbilical hernia in adults. Direct hernia. This type occurs in an opening that's formed by the belly button. Reducible hernia. This hernia comes and goes. You may see it only when you strain, cough, or lift something heavy. This type of hernia can be pushed back into the belly (reduced). Incarcerated hernia. This traps the hernia in the wall of the belly. This type of hernia can't be pushed back into the belly. It can cause a strangulated hernia. Strangulated hernia. This hernia cuts off blood flow to the tissues inside the hernia. The tissues can die if this happens. This type of hernia must be treated right away. What are the causes? An umbilical hernia happens when tissue inside the belly pushes through an opening in the muscles of the belly. What increases the risk? You're more likely to get this hernia if: You strain while lifting or pushing heavy objects. You've had several pregnancies. You have a condition that puts pressure on your belly, and you've had it for a long time. These include: Obesity. A buildup of fluid inside your belly. Vomiting or coughing all the time. Trouble pooping (constipation). You've had surgery that weakened the muscles in the belly. What are the signs or symptoms? The main symptom of this condition is a bulge at the belly button or near it. The bulge does not  cause pain. Other symptoms depend on the type of hernia you have. A reducible hernia may be seen only when you strain, cough, or lift something heavy. Other symptoms may include: Dull pain. A feeling of pressure. An incarcerated hernia may cause very bad pain. Also, you may: Vomit or feel like you may vomit. Not be able to pass gas. A strangulated hernia may cause: Pain that gets worse and worse. Vomiting, or feeling like you may vomit. Pain when you press on the hernia. Change of color on the skin over the hernia. The skin may become red or purple. Trouble pooping. Blood in the poop. How is this diagnosed? This condition may be diagnosed based on: Your symptoms and medical history. A physical exam. You may be asked to cough or strain while standing. These actions will put pressure inside your belly. The pressure can force the hernia through the opening in your muscles. Your health care provider may try to push the hernia back into your belly (reduce). How is this treated? Surgery is the only treatment for an umbilical hernia. Surgery for a strangulated hernia must be done right away. If you have a small hernia that's not incarcerated, you may need to lose weight before the surgery is done. Follow these instructions at home: Managing constipation You may need to take these actions to prevent trouble pooping. This will help to prevent straining. Drink enough fluid to keep your pee (urine) pale yellow. Take over-the-counter or prescription medicines. Eat foods that are high in fiber, such  as beans, whole grains, and fresh fruits and vegetables. Limit foods that are high in fat and sugars, such as fried or sweet foods. General instructions Do not try to push the hernia back in. Lose weight, if told by your provider. Watch your hernia for any changes in color or size. Tell your provider if any changes occur. You may need to avoid activities that put pressure on your hernia. You may  have to avoid lifting. Ask your provider how much you can safely lift. Take over-the-counter and prescription medicines only as told by your provider. Contact a health care provider if: Your hernia gets larger or feels hard. Your hernia becomes painful. You get a fever or chills. Get help right away if: You get very bad pain near the area of the hernia, and the pain comes on suddenly. You have pain and you vomit or feel like you may vomit. The skin over your hernia changes color. These symptoms may be an emergency. Get help right away. Call 911. Do not wait to see if the symptoms go away. Do not drive yourself to the hospital. This information is not intended to replace advice given to you by your health care provider. Make sure you discuss any questions you have with your health care provider. Document Revised: 12/31/2022 Document Reviewed: 12/31/2022 Elsevier Patient Education  2024 [employer].

## 2023-12-11 ENCOUNTER — Other Ambulatory Visit: Payer: Self-pay

## 2023-12-11 ENCOUNTER — Ambulatory Visit (INDEPENDENT_AMBULATORY_CARE_PROVIDER_SITE_OTHER): Payer: BC Managed Care – PPO | Admitting: Family Medicine

## 2023-12-11 ENCOUNTER — Encounter: Payer: Self-pay | Admitting: Family Medicine

## 2023-12-11 VITALS — BP 124/78 | HR 86 | Resp 16 | Ht 64.0 in | Wt 236.1 lb

## 2023-12-11 DIAGNOSIS — M5137 Other intervertebral disc degeneration, lumbosacral region with discogenic back pain only: Secondary | ICD-10-CM

## 2023-12-11 DIAGNOSIS — E559 Vitamin D deficiency, unspecified: Secondary | ICD-10-CM

## 2023-12-11 DIAGNOSIS — I10 Essential (primary) hypertension: Secondary | ICD-10-CM

## 2023-12-11 DIAGNOSIS — F325 Major depressive disorder, single episode, in full remission: Secondary | ICD-10-CM | POA: Diagnosis not present

## 2023-12-11 DIAGNOSIS — E8881 Metabolic syndrome: Secondary | ICD-10-CM

## 2023-12-11 DIAGNOSIS — K429 Umbilical hernia without obstruction or gangrene: Secondary | ICD-10-CM

## 2023-12-11 DIAGNOSIS — D75839 Thrombocytosis, unspecified: Secondary | ICD-10-CM

## 2023-12-11 DIAGNOSIS — F5105 Insomnia due to other mental disorder: Secondary | ICD-10-CM

## 2023-12-11 DIAGNOSIS — E786 Lipoprotein deficiency: Secondary | ICD-10-CM

## 2023-12-11 MED ORDER — CELECOXIB 100 MG PO CAPS
100.0000 mg | ORAL_CAPSULE | Freq: Two times a day (BID) | ORAL | 0 refills | Status: DC
  Filled 2023-12-11: qty 60, 30d supply, fill #0

## 2023-12-11 MED ORDER — QUETIAPINE FUMARATE 25 MG PO TABS
25.0000 mg | ORAL_TABLET | Freq: Every evening | ORAL | 0 refills | Status: DC
  Filled 2023-12-11: qty 60, 60d supply, fill #0

## 2023-12-11 MED ORDER — TRIAMTERENE-HCTZ 37.5-25 MG PO TABS
1.0000 | ORAL_TABLET | Freq: Every day | ORAL | 0 refills | Status: DC
  Filled 2023-12-11: qty 60, 60d supply, fill #0

## 2023-12-11 MED ORDER — TIRZEPATIDE-WEIGHT MANAGEMENT 2.5 MG/0.5ML ~~LOC~~ SOLN: 2.5000 mg | SUBCUTANEOUS | 0 refills | Status: DC

## 2023-12-11 MED ORDER — VITAMIN D (ERGOCALCIFEROL) 1.25 MG (50000 UNIT) PO CAPS
50000.0000 [IU] | ORAL_CAPSULE | ORAL | 0 refills | Status: DC
  Filled 2023-12-11: qty 8, 56d supply, fill #0

## 2023-12-11 MED ORDER — METFORMIN HCL ER 500 MG PO TB24: 500.0000 mg | ORAL_TABLET | Freq: Every day | ORAL | 0 refills | Status: DC

## 2023-12-11 NOTE — Progress Notes (Signed)
 Name: Cheyenne Walker   MRN: [id number]    DOB: 03/01/1971   Date:12/11/2023       Progress Note  Subjective  Chief Complaint  Chief Complaint  Patient presents with   Medical Management of Chronic Issues   HPI   MDD: she is living with her daughter and helps with grandchildren. She worries about her son that is in [place] . She states she feels well, in remission, but has insomnia and still takes seroquel that helps her sleep   FMS: she states for years she has aches and pains on her muscles. She stopped duloxetine, states since more active with grandchildren pain has decreased    Morbid obesity: BMI above 40 , she is always walking and gets at least 10 thousand steps per day most of the days around 20 thousands steps. She needs to lose weight to have umbilical hernia repair. She is active but not following a balanced diet and needs to cut down on carbohydrates. She would like to try GLP-1 agonist again. We will try sending to pharmacy today    Low HDL: she has been more active, discussed increase in tree nuts ,fruits and vegetables .   HTN: taking medication and bp is at goal, no side effects. No chest pain, palpitation or sob   DDD lumbar spine/Chronic pain: she is currently doing well, taking celebrex prn when pain radiates down right leg.    Vitamin D deficiency: continue vitamin d rx  Umbilical hernia: had it repaired in 2016 but over the past 6 months started to have periumbilical pain, went to [facility] had repeat scans , seen by surgeon but must lose weight to have the surgery done again    Patient Active Problem List   Diagnosis Date Noted   Insomnia due to mental disorder 08/11/2023   Periumbilical abdominal pain 08/11/2023   Screening for colon cancer    Adenomatous polyp of transverse colon    Adenomatous polyp of ascending colon    Lumbar radiculopathy (L5) 10/08/2018   Chronic pain syndrome 10/08/2018   DDD (degenerative disc disease), lumbosacral 05/01/2017   Anxiety  04/03/2015   Benign essential HTN 04/03/2015   Allergic rhinitis 04/03/2015   Gastro-esophageal reflux disease without esophagitis 04/03/2015   Major depression, recurrent, chronic (HCC) 04/03/2015   Morbid obesity with BMI of 40.0-44.9, adult (HCC) 04/03/2015   Dysmetabolic syndrome 04/03/2015   Numerous moles 04/03/2015   Plantar fasciitis 04/03/2015   PTSD (post-traumatic stress disorder) 04/03/2015   Vitamin D deficiency 04/03/2015   Umbilical hernia without obstruction and without gangrene 04/03/2015    Past Surgical History:  Procedure Laterality Date   CHOLECYSTECTOMY  2006   COLONOSCOPY WITH PROPOFOL N/A 06/05/2022   Procedure: COLONOSCOPY WITH PROPOFOL;  Surgeon: Toney Reil, MD;  Location: [facility] ENDOSCOPY;  Service: Gastroenterology;  Laterality: N/A;   HERNIA REPAIR  10/2014    Family History  Problem Relation Age of Onset   Hypertension Mother    Hypertension Sister    Hypertension Brother     Social History   Tobacco Use   Smoking status: Never    Passive exposure: Never   Smokeless tobacco: Never  Substance Use Topics   Alcohol use: No    Alcohol/week: 0.0 standard drinks of alcohol     Current Outpatient Medications:    celecoxib (CELEBREX) 100 MG capsule, TAKE 1 CAPSULE BY MOUTH TWICE A DAY, Disp: 60 capsule, Rfl: 0   cyclobenzaprine (FLEXERIL) 10 MG tablet, Take 1 tablet (10  mg total) by mouth at bedtime., Disp: 10 tablet, Rfl: 0   fluticasone (FLONASE) 50 MCG/ACT nasal spray, Place 2 sprays into both nostrils daily., Disp: 48 g, Rfl: 0   predniSONE (STERAPRED UNI-PAK 21 TAB) 10 MG (21) TBPK tablet, Take by mouth daily. Take 6 tabs by mouth daily  for 1 days, then 5 tabs for 1 days, then 4 tabs for 1 days, then 3 tabs for 1 days, 2 tabs for 1 days, then 1 tab by mouth daily for 1 days, Disp: 21 tablet, Rfl: 0   QUEtiapine (SEROQUEL) 25 MG tablet, Take 1 tablet (25 mg total) by mouth every evening., Disp: 90 tablet, Rfl: 0    triamterene-hydrochlorothiazide (MAXZIDE-25) 37.5-25 MG tablet, TAKE 1 TABLET BY MOUTH EVERY DAY, Disp: 90 tablet, Rfl: 0   Vitamin D, Ergocalciferol, (DRISDOL) 1.25 MG (50000 UNIT) CAPS capsule, Take 1 capsule (50,000 Units total) by mouth every 7 (seven) days., Disp: 12 capsule, Rfl: 0  Allergies  Allergen Reactions   Ace Inhibitors Other (See Comments)    Cough. Pt states specifically catopril    I personally reviewed active problem list, medication list, allergies, family history with the patient/caregiver today.   ROS  Ten systems reviewed and is negative except as mentioned in HPI    Objective  Vitals:   12/11/23 1050  BP: 124/78  Pulse: 86  Resp: 16  SpO2: 97%  Weight: 236 lb 1.6 oz (107.1 kg)  Height: 5\' 4"  (1.626 m)    Body mass index is 40.53 kg/m.  Physical Exam  Constitutional: Patient appears well-developed and well-nourished. Obese  No distress.  HEENT: head atraumatic, normocephalic, pupils equal and reactive to light, neck supple Cardiovascular: Normal rate, regular rhythm and normal heart sounds.  No murmur heard. No BLE edema. Pulmonary/Chest: Effort normal and breath sounds normal. No respiratory distress. Abdominal: Soft.  There is tenderness below umbilicus , no redness or increase in warmth, no masses felt Psychiatric: Patient has a normal mood and affect. behavior is normal. Judgment and thought content normal.   Diabetic Foot Exam:     PHQ2/9:    12/11/2023   10:51 AM 08/08/2023    1:40 PM 02/13/2023    9:22 AM 12/20/2022    7:48 AM 05/13/2022    1:32 PM  Depression screen PHQ 2/9  Decreased Interest 0 0 0 2 2  Down, Depressed, Hopeless 0 0 0 2 2  PHQ - 2 Score 0 0 0 4 4  Altered sleeping 0 0 0 2 3  Tired, decreased energy 0 0 1 2 3   Change in appetite 0 0 0 2 3  Feeling bad or failure about yourself  0 0 0 2 2  Trouble concentrating 0 0 0 0 0  Moving slowly or fidgety/restless 0 0 0 0 0  Suicidal thoughts 0 0 0 0 0  PHQ-9 Score 0 0 1  12 15   Difficult doing work/chores Not difficult at all Not difficult at all Not difficult at all Somewhat difficult Not difficult at all    phq 9 is negative  Fall Risk:    08/08/2023    1:33 PM 02/13/2023    9:22 AM 12/20/2022    7:48 AM 05/13/2022    1:32 PM 09/20/2021    9:23 AM  Fall Risk   Falls in the past year? 0 0 0 1 1  Number falls in past yr: 0  0 0 0  Injury with Fall? 0  0 0 0  Risk  for fall due to : No Fall Risks No Fall Risks No Fall Risks History of fall(s) No Fall Risks  Follow up Falls prevention discussed;Education provided;Falls evaluation completed Falls prevention discussed;Education provided;Falls evaluation completed Falls prevention discussed;Education provided;Falls evaluation completed Falls prevention discussed;Education provided;Falls evaluation completed Falls prevention discussed     Assessment & Plan  1. Major depression, recurrent, in remission  (HCC) (Primary)  Busy with grandkids - "I don't have time to be depressed"   2. Morbid obesity (HCC)  - tirzepatide (ZEPBOUND) 2.5 MG/0.5ML injection vial; Inject 2.5 mg into the skin once a week.  Dispense: 2 mL; Refill: 0  3. Umbilical hernia without obstruction and without gangrene  Surgeon will wait to repair once she loses weight   4. Benign essential HTN  - triamterene-hydrochlorothiazide (MAXZIDE-25) 37.5-25 MG tablet; Take 1 tablet by mouth daily.  Dispense: 90 tablet; Refill: 0  5. Low HDL (under 40)  Discussed tree nuts and fish   6. Dysmetabolic syndrome  Discussed modification diet , we will add metformin   7. Insomnia due to mental disorder  - QUEtiapine (SEROQUEL) 25 MG tablet; Take 1 tablet (25 mg total) by mouth every evening.  Dispense: 90 tablet; Refill: 0  8. Vitamin D deficiency  - Vitamin D, Ergocalciferol, (DRISDOL) 1.25 MG (50000 UNIT) CAPS capsule; Take 1 capsule (50,000 Units total) by mouth every 7 (seven) days.  Dispense: 12 capsule; Refill: 0  9. Degeneration of  intervertebral disc of lumbosacral region with discogenic back pain  - celecoxib (CELEBREX) 100 MG capsule; Take 1 capsule (100 mg total) by mouth 2 (two) times daily.  Dispense: 60 capsule; Refill: 0  10. Thrombocytosis  We will recheck it next visit

## 2023-12-30 ENCOUNTER — Encounter: Payer: Self-pay | Admitting: Emergency Medicine

## 2023-12-30 ENCOUNTER — Emergency Department
Admission: EM | Admit: 2023-12-30 | Discharge: 2023-12-30 | Disposition: A | Discharge status: Home / Self Care | Attending: Emergency Medicine | Admitting: Emergency Medicine

## 2023-12-30 ENCOUNTER — Other Ambulatory Visit: Payer: Self-pay

## 2023-12-30 DIAGNOSIS — R002 Palpitations: Secondary | ICD-10-CM | POA: Diagnosis not present

## 2023-12-30 DIAGNOSIS — R55 Syncope and collapse: Secondary | ICD-10-CM | POA: Diagnosis not present

## 2023-12-30 DIAGNOSIS — I10 Essential (primary) hypertension: Secondary | ICD-10-CM | POA: Diagnosis not present

## 2023-12-30 LAB — URINALYSIS, ROUTINE W REFLEX MICROSCOPIC
Bilirubin Urine: NEGATIVE
Glucose, UA: NEGATIVE mg/dL
Hgb urine dipstick: NEGATIVE
Ketones, ur: NEGATIVE mg/dL
Leukocytes,Ua: NEGATIVE
Nitrite: NEGATIVE
Protein, ur: NEGATIVE mg/dL
Specific Gravity, Urine: 1.006 (ref 1.005–1.030)
pH: 6 (ref 5.0–8.0)

## 2023-12-30 LAB — BASIC METABOLIC PANEL WITH GFR
Anion gap: 9 (ref 5–15)
BUN: 23 mg/dL — ABNORMAL HIGH (ref 6–20)
CO2: 26 mmol/L (ref 22–32)
Calcium: 9.1 mg/dL (ref 8.9–10.3)
Chloride: 104 mmol/L (ref 98–111)
Creatinine, Ser: 0.6 mg/dL (ref 0.44–1.00)
GFR, Estimated: >60 mL/min (ref >60)
Glucose, Bld: 71 mg/dL (ref 70–99)
Potassium: 3.3 mmol/L — ABNORMAL LOW (ref 3.5–5.1)
Sodium: 139 mmol/L (ref 135–145)

## 2023-12-30 LAB — CBC
HCT: 41.9 % (ref 36.0–46.0)
Hemoglobin: 14 g/dL (ref 12.0–15.0)
MCH: 29.1 pg (ref 26.0–34.0)
MCHC: 33.4 g/dL (ref 30.0–36.0)
MCV: 87.1 fL (ref 80.0–100.0)
Platelets: 335 10*3/uL (ref 150–400)
RBC: 4.81 MIL/uL (ref 3.87–5.11)
RDW: 14.2 % (ref 11.5–15.5)
WBC: 7.5 10*3/uL (ref 4.0–10.5)
nRBC: 0 % (ref 0.0–0.2)

## 2023-12-30 LAB — TROPONIN I (HIGH SENSITIVITY): Troponin I (High Sensitivity): 4 ng/L (ref <18)

## 2023-12-30 LAB — CBG MONITORING, ED: Glucose-Capillary: 96 mg/dL (ref 70–99)

## 2023-12-30 LAB — MAGNESIUM: Magnesium: 2.4 mg/dL (ref 1.7–2.4)

## 2023-12-30 MED ORDER — POTASSIUM CHLORIDE CRYS ER 20 MEQ PO TBCR
40.0000 meq | EXTENDED_RELEASE_TABLET | Freq: Once | ORAL | Status: AC
  Administered 2023-12-30: 40 meq via ORAL
  Filled 2023-12-30: qty 2

## 2023-12-30 MED ORDER — LACTATED RINGERS IV BOLUS
1000.0000 mL | Freq: Once | INTRAVENOUS | Status: AC
  Administered 2023-12-30: 1000 mL via INTRAVENOUS

## 2023-12-30 NOTE — ED Notes (Signed)
 First nurse note: Pt here via [facility] from work. Pt felt fine this morning, pt states diarrhea and pt c/o of dizziness.   CBG 230 128/75 99% HR: 64

## 2023-12-30 NOTE — ED Notes (Signed)
 EDP at bedside. Pt reports feeling SOB today and then dizzy and then lost consc for about 5 min. Denies known injuries. Son at bedside.

## 2023-12-30 NOTE — ED Triage Notes (Signed)
 Patient to ED via POV for syncope. PT reports getting weak and dizziness. Denies hitting anything while passing out. Denies pain. Also had diarrhea x3 days- going around the family.

## 2023-12-30 NOTE — ED Provider Notes (Signed)
 [facility] Provider Note    Event Date/Time   First MD Initiated Contact with Patient 12/30/23 1650     (approximate)   History   Chief Complaint: Loss of Consciousness   HPI  Cheyenne Walker is a 53 y.o. female with past history of hypertension, obesity who comes ED complaining of syncope.  Patient reports an episode of feeling lightheaded today while she was on the phone with her son, and then passed out.  Episode lasted for a few minutes and then she felt back to normal.  Denies any preceding pain or pain afterward.  Denies any injury.  Patient does report that over the last several days, she and other household family members have all been stricken with a GI viral illness involving vomiting, diarrhea, decreased oral intake.  She denies fever.  Symptoms are improving at this point  Patient does report that every few months she has an episode of feeling like her heart has multiple rapid beats, and she develops shortness of breath.  This usually last for only a few seconds and then goes back to normal.  Denies racing heart or palpitations with today's episode        Physical Exam   Triage Vital Signs: ED Triage Vitals  Encounter Vitals Group     BP 12/30/23 1504 (!) 147/114     Systolic BP Percentile --      Diastolic BP Percentile --      Pulse Rate 12/30/23 1504 65     Resp 12/30/23 1504 17     Temp 12/30/23 1504 98 F (36.7 C)     Temp Source 12/30/23 1504 Oral     SpO2 12/30/23 1504 100 %     Weight 12/30/23 1502 225 lb (102.1 kg)     Height 12/30/23 1502 5\' 4"  (1.626 m)     Head Circumference --      Peak Flow --      Pain Score 12/30/23 1501 0     Pain Loc --      Pain Education --      Exclude from Growth Chart --     Most recent vital signs: Vitals:   12/30/23 1800 12/30/23 1850  BP: (!) 144/76   Pulse: (!) 56   Resp: 17   Temp:  98 F (36.7 C)  SpO2: 100%     General: Awake, no distress.  CV:  Good peripheral  perfusion.  Regular rate rhythm Resp:  Normal effort.  Clear to auscultation bilaterally Abd:  No distention.  Soft nontender Other:  Somewhat dry oral mucosa.  Cranial nerves III through XII intact.   ED Results / Procedures / Treatments   Labs (all labs ordered are listed, but only abnormal results are displayed) Labs Reviewed  BASIC METABOLIC PANEL WITH GFR - Abnormal; Notable for the following components:      Result Value   Potassium 3.3 (*)    BUN 23 (*)    All other components within normal limits  URINALYSIS, ROUTINE W REFLEX MICROSCOPIC - Abnormal; Notable for the following components:   Color, Urine STRAW (*)    APPearance CLEAR (*)    All other components within normal limits  CBC  MAGNESIUM  CBG MONITORING, ED  TROPONIN I (HIGH SENSITIVITY)     EKG Interpreted by me Sinus rhythm rate of 66.  Right axis, normal intervals.  Normal QRS ST segments and T waves, no evidence of underlying dysrhythmia   RADIOLOGY  PROCEDURES:  Procedures   MEDICATIONS ORDERED IN ED: Medications  lactated ringers bolus 1,000 mL (0 mLs Intravenous Stopped 12/30/23 1821)  potassium chloride SA (KLOR-CON M) CR tablet 40 mEq (40 mEq Oral Given 12/30/23 1731)     IMPRESSION / MDM / ASSESSMENT AND PLAN / ED COURSE  I reviewed the triage vital signs and the nursing notes.  DDx: Electrolyte derangement, AKI, dehydration, anemia,  Patient's presentation is most consistent with acute presentation with potential threat to life or bodily function.  Patient presents with an episode of syncope at home without worrisome prodromal symptoms.  No pain.  Currently asymptomatic.  History consistent with dehydration.  Exam reassuring.  Will give IV fluids, check labs to evaluate electrolytes, signs of anemia or AKI, check high-sensitivity troponin once as screening for arrhythmia.   ----------------------------------------- 7:03 PM on  12/30/2023 ----------------------------------------- Workup normal.  Orthostatics normal.  Tolerating oral intake.  No further symptoms.  Stable for discharge, cardiology referral requested        FINAL CLINICAL IMPRESSION(S) / ED DIAGNOSES   Final diagnoses:  Syncope, unspecified syncope type  Palpitations     Rx / DC Orders   ED Discharge Orders          Ordered    Ambulatory referral to Cardiology       Comments: If you have not heard from the Cardiology office within the next 72 hours please call [phone].   12/30/23 1903             Note:  This document was prepared using Dragon voice recognition software and may include unintentional dictation errors.   Sharman Cheek, MD 12/30/23 [phone]

## 2024-01-05 ENCOUNTER — Other Ambulatory Visit: Payer: Self-pay | Admitting: Family Medicine

## 2024-01-05 NOTE — Telephone Encounter (Unsigned)
 Copied from CRM [id number]. Topic: Clinical - Medication Refill >> Jan 05, 2024  9:05 AM Annice Kim wrote: Most Recent Primary Care Visit:  Provider: Arleen Lacer  Department: [facility]  Visit Type: OFFICE VISIT  Date: 12/11/2023  Medication: tirzepatide (ZEPBOUND)   Has the patient contacted their pharmacy? Yes (Agent: If no, request that the patient contact the pharmacy for the refill. If patient does not wish to contact the pharmacy document the reason why and proceed with request.) (Agent: If yes, when and what did the pharmacy advise?)  Is this the correct pharmacy for this prescription? Yes If no, delete pharmacy and type the correct one.  This is the patient's preferred pharmacy:   [id number] [address]  [place] [address] [phone]  Has the prescription been filled recently? Yes  Is the patient out of the medication? Yes  Has the patient been seen for an appointment in the last year OR does the patient have an upcoming appointment? Yes  Can we respond through MyChart? Yes  Agent: Please be advised that Rx refills may take up to 3 business days. We ask that you follow-up with your pharmacy.  Patient took the last injection of 2.5 patient wants to increase dosage

## 2024-01-06 ENCOUNTER — Other Ambulatory Visit: Payer: Self-pay | Admitting: Internal Medicine

## 2024-01-06 MED ORDER — TIRZEPATIDE-WEIGHT MANAGEMENT 2.5 MG/0.5ML ~~LOC~~ SOLN: 2.5000 mg | SUBCUTANEOUS | 0 refills | Status: DC

## 2024-01-06 NOTE — Telephone Encounter (Signed)
 Requested medication (s) are due for refill today:yes  Requested medication (s) are on the active medication list:yes  Last refill:  12/11/23  Future visit scheduled: yes  Notes to clinic:   med not assigned to a protocol   Requested Prescriptions  Pending Prescriptions Disp Refills   tirzepatide (ZEPBOUND) 2.5 MG/0.5ML injection vial 2 mL 0    Sig: Inject 2.5 mg into the skin once a week.     Off-Protocol Failed - 01/06/2024 11:21 AM      Failed - Medication not assigned to a protocol, review manually.      Failed - Valid encounter within last 12 months    Recent Outpatient Visits           3 weeks ago Major depression in remission [facility])   [facility] Arleen Lacer, MD       Future Appointments             In 2 months Ava Lei, [name], MD [facility], [facility]   In 2 months Agbor-Etang, Polly Brink, MD [facility]

## 2024-01-07 NOTE — Telephone Encounter (Signed)
  Pharmacy comment: Alternative Requested:VIALS NOT COVERED.

## 2024-01-08 ENCOUNTER — Inpatient Hospital Stay
Admission: RE | Admit: 2024-01-08 | Discharge: 2024-01-08 | Disposition: A | Discharge status: Home / Self Care | Payer: Self-pay | Source: Ambulatory Visit | Attending: Family Medicine | Admitting: Family Medicine

## 2024-01-08 ENCOUNTER — Telehealth: Payer: Self-pay | Admitting: Family Medicine

## 2024-01-08 ENCOUNTER — Other Ambulatory Visit: Payer: Self-pay | Admitting: *Deleted

## 2024-01-08 DIAGNOSIS — Z1231 Encounter for screening mammogram for malignant neoplasm of breast: Secondary | ICD-10-CM

## 2024-01-08 NOTE — Telephone Encounter (Signed)
 Copied from CRM [id number]. Topic: Clinical - Prescription Issue >> Jan 08, 2024  9:48 AM Carlatta H wrote: Reason for CRM: Patients tirzepatide (ZEPBOUND) 2.5 MG/0.5ML injection vial [[id number]] prescription needs to be called in a dosage higher//The prescription was called in lower// Please use pharmacy : [id number] [place], [place] - [address] [[id number]]//Please advise patient via my chart

## 2024-01-09 NOTE — Telephone Encounter (Signed)
 Pt aware you on vacation, pt would still prefer to do the next increase dose. Please advice

## 2024-01-09 NOTE — Telephone Encounter (Signed)
 Patient calling in to follow up on request for increase in dosage of zepbound . Patient requesting 5mg  not the 2.5mg  to be sent to pharmacy.   Patient due for next injection Sunday.   Patient has called in regards to this 3 times now.   Patient requesting call back, [phone]

## 2024-01-11 ENCOUNTER — Other Ambulatory Visit: Payer: Self-pay | Admitting: Family Medicine

## 2024-01-11 MED ORDER — ZEPBOUND 2.5 MG/0.5ML ~~LOC~~ SOAJ: 2.5000 mg | SUBCUTANEOUS | 0 refills | Status: DC

## 2024-01-12 ENCOUNTER — Other Ambulatory Visit: Payer: Self-pay | Admitting: Family Medicine

## 2024-01-12 ENCOUNTER — Ambulatory Visit: Payer: Self-pay

## 2024-01-12 MED ORDER — ZEPBOUND 5 MG/0.5ML ~~LOC~~ SOAJ: 5.0000 mg | SUBCUTANEOUS | 0 refills | Status: DC

## 2024-01-12 NOTE — Telephone Encounter (Signed)
 Patient is requesting a higher dosage of tirzepatide  (ZEPBOUND ) 2.5 MG/0.5ML Pen. She is requesting 5MG  and to pick it up at her preferred pharmacy:        Callback #: [phone]        Preferred Pharmacy:    [id number] [place], [place] - [address]    [address] [place] [address]    Phone: [phone] Fax: [phone]    Hours: Not open 24 hours    Copied from CRM #[id number]. Topic: Clinical - Medication Question >> Jan 12, 2024  4:33 PM Alpha Arts wrote: Reason for CRM: Patient is requesting a higher dosage of tirzepatide  (ZEPBOUND ) 2.5 MG/0.5ML Pen. She is requesting 5MG  and to pick it up at her preferred pharmacy:  Callback #: [phone]  Preferred [facility]acy: [id number] [place], [place] - [address] [place] [address] Phone: [phone] Fax: [phone] Hours: Not open 24 hours Reason for Disposition  [1] Follow-up call from patient regarding patient's clinical status AND [2] information NON-URGENT  Answer Assessment - Initial Assessment Questions 1. REASON FOR CALL or QUESTION: "What is your reason for calling today?" or "How can I best help you?" or "What question do you have that I can help answer?"     Please see nurse triage note 2. CALLER: Document the source of call. (e.g., laboratory, patient).     Patient  Protocols used: PCP Call - No Triage-A-AH

## 2024-01-12 NOTE — Telephone Encounter (Signed)
 Patient requesting 5mg  not the 2.5mg  to be sent to pharmacy.

## 2024-01-13 MED ORDER — ZEPBOUND 5 MG/0.5ML ~~LOC~~ SOAJ: 5.0000 mg | SUBCUTANEOUS | 0 refills | Status: DC

## 2024-01-13 NOTE — Addendum Note (Signed)
 Addended by: Tsosie Gail on: 01/13/2024 08:10 AM   Modules accepted: Orders

## 2024-01-13 NOTE — Addendum Note (Signed)
 Addended by: Tsosie Gail on: 01/13/2024 08:11 AM   Modules accepted: Orders

## 2024-01-16 ENCOUNTER — Ambulatory Visit
Admission: RE | Admit: 2024-01-16 | Discharge: 2024-01-16 | Disposition: A | Discharge status: Home / Self Care | Source: Ambulatory Visit | Attending: Physician Assistant | Admitting: Physician Assistant

## 2024-01-16 DIAGNOSIS — Z1231 Encounter for screening mammogram for malignant neoplasm of breast: Secondary | ICD-10-CM | POA: Diagnosis not present

## 2024-01-28 DIAGNOSIS — M25561 Pain in right knee: Secondary | ICD-10-CM | POA: Diagnosis not present

## 2024-02-04 ENCOUNTER — Other Ambulatory Visit: Payer: Self-pay | Admitting: Family Medicine

## 2024-02-04 DIAGNOSIS — I10 Essential (primary) hypertension: Secondary | ICD-10-CM

## 2024-02-13 DIAGNOSIS — M1711 Unilateral primary osteoarthritis, right knee: Secondary | ICD-10-CM | POA: Diagnosis not present

## 2024-02-13 DIAGNOSIS — M25461 Effusion, right knee: Secondary | ICD-10-CM | POA: Diagnosis not present

## 2024-02-18 NOTE — Telephone Encounter (Signed)
 Copied from CRM [id number]. Topic: Clinical - Medication Question >> Feb 18, 2024  9:01 AM Baldomero Bone wrote: Reason for CRM: Patient took last shot of tirzepatide  (ZEPBOUND ) 5 MG/0.5ML Pen on Sunday. Patient is ready for the next dosage. Callback number is [phone]

## 2024-02-19 ENCOUNTER — Other Ambulatory Visit: Payer: Self-pay | Admitting: Family Medicine

## 2024-02-19 NOTE — Telephone Encounter (Signed)
 Copied from CRM [id number]. Topic: Clinical - Medication Refill >> Feb 19, 2024  3:41 PM Felizardo Hotter wrote: Medication: tirzepatide  (ZEPBOUND ) 5 MG/0.5ML Pen  Has the patient contacted their pharmacy? Yes (Agent: If no, request that the patient contact the pharmacy for the refill. If patient does not wish to contact the pharmacy document the reason why and proceed with request.) (Agent: If yes, when and what did the pharmacy advise?)  This is the patient's preferred pharmacy:  [id number] [place], [place] - [address] [place] [address] Phone: [phone] Fax: [phone]  Is this the correct pharmacy for this prescription? Yes If no, delete pharmacy and type the correct one.   Has the prescription been filled recently? Yes  Is the patient out of the medication? Yes  Has the patient been seen for an appointment in the last year OR does the patient have an upcoming appointment? Yes  Can we respond through MyChart? Yes  Agent: Please be advised that Rx refills may take up to 3 business days. We ask that you follow-up with your pharmacy.

## 2024-02-21 MED ORDER — ZEPBOUND 5 MG/0.5ML ~~LOC~~ SOAJ: 5.0000 mg | SUBCUTANEOUS | 0 refills | Status: DC

## 2024-02-21 NOTE — Telephone Encounter (Signed)
 Requested Prescriptions  Pending Prescriptions Disp Refills   tirzepatide  (ZEPBOUND ) 5 MG/0.5ML Pen 6 mL 0    Sig: Inject 5 mg into the skin once a week.     Off-Protocol Failed - 02/21/2024  2:12 PM      Failed - Medication not assigned to a protocol, review manually.      Failed - Valid encounter within last 12 months    Recent Outpatient Visits           2 months ago Major depression in remission [facility])   [facility] Arleen Lacer, MD       Future Appointments             In 3 weeks Ava Lei, [name], MD [facility], [facility]   In 1 month Constancia Delton, MD [facility]

## 2024-02-25 ENCOUNTER — Other Ambulatory Visit: Payer: Self-pay

## 2024-02-25 MED ORDER — ZEPBOUND 5 MG/0.5ML ~~LOC~~ SOAJ: 5.0000 mg | SUBCUTANEOUS | 0 refills | Status: DC

## 2024-03-09 ENCOUNTER — Other Ambulatory Visit: Payer: Self-pay | Admitting: Family Medicine

## 2024-03-09 DIAGNOSIS — E8881 Metabolic syndrome: Secondary | ICD-10-CM

## 2024-03-16 ENCOUNTER — Encounter: Admitting: Family Medicine

## 2024-03-31 ENCOUNTER — Other Ambulatory Visit: Payer: Self-pay | Admitting: Family Medicine

## 2024-03-31 DIAGNOSIS — F5105 Insomnia due to other mental disorder: Secondary | ICD-10-CM

## 2024-03-31 DIAGNOSIS — I10 Essential (primary) hypertension: Secondary | ICD-10-CM

## 2024-03-31 NOTE — Telephone Encounter (Signed)
 Copied from CRM [id number]. Topic: Clinical - Medication Refill >> Mar 31, 2024  9:30 AM Elle L wrote: Medication: tirzepatide  (ZEPBOUND ) 5 MG/0.5ML Pen (The patient is requesting a higher dosage) AND QUEtiapine  (SEROQUEL ) 25 MG tablet AND triamterene -hydrochlorothiazide  (MAXZIDE -25) 37.5-25 MG tablet  Has the patient contacted their pharmacy? Yes  This is the patient's preferred pharmacy:  [id number] [place], [place] - [address] [place] [address] Phone: [phone] Fax: [phone]  Is this the correct pharmacy for this prescription? Yes  Has the prescription been filled recently? Yes  Is the patient out of the medication? No, 2 left.   Has the patient been seen for an appointment in the last year OR does the patient have an upcoming appointment? Yes  Can we respond through MyChart? Yes  Agent: Please be advised that Rx refills may take up to 3 business days. We ask that you follow-up with your pharmacy.

## 2024-04-01 ENCOUNTER — Ambulatory Visit: Attending: Cardiology | Admitting: Cardiology

## 2024-04-01 ENCOUNTER — Other Ambulatory Visit: Payer: Self-pay | Admitting: Family Medicine

## 2024-04-01 DIAGNOSIS — I10 Essential (primary) hypertension: Secondary | ICD-10-CM

## 2024-04-01 DIAGNOSIS — F5105 Insomnia due to other mental disorder: Secondary | ICD-10-CM

## 2024-04-01 NOTE — Telephone Encounter (Signed)
 Copied from CRM [id number]. Topic: Clinical - Medication Refill >> Mar 31, 2024  9:30 AM Elle L wrote: Medication: tirzepatide  (ZEPBOUND ) 5 MG/0.5ML Pen (The patient is requesting a higher dosage) AND QUEtiapine  (SEROQUEL ) 25 MG tablet AND triamterene -hydrochlorothiazide  (MAXZIDE -25) 37.5-25 MG tablet  Has the patient contacted their pharmacy? Yes  This is the patient's preferred pharmacy:  [id number] [place], [place] - [address] [place] [address] Phone: [phone] Fax: [phone]  Is this the correct pharmacy for this prescription? Yes  Has the prescription been filled recently? Yes  Is the patient out of the medication? No, 2 left.   Has the patient been seen for an appointment in the last year OR does the patient have an upcoming appointment? Yes  Can we respond through MyChart? Yes  Agent: Please be advised that Rx refills may take up to 3 business days. We ask that you follow-up with your pharmacy.

## 2024-04-02 MED ORDER — TRIAMTERENE-HCTZ 37.5-25 MG PO TABS
1.0000 | ORAL_TABLET | Freq: Every day | ORAL | 0 refills | Status: DC
Start: 2024-04-02 — End: 2024-04-05

## 2024-04-02 NOTE — Telephone Encounter (Signed)
 Requested medication (s) are due for refill today:   Yes for all 3   Maxzide  already sent in today 04/02/2024 #90, 0 refills This is a duplicate request.  Requested medication (s) are on the active medication list:   Yes for all 3  Future visit scheduled:   No    Seen 3 mo. ago   Last ordered: Zepbound  02/25/2024 2 ml, 0 refills - Pt is requesting an increased dose;   Seroquel  12/11/2023 #90, 0 refills - Non delegated refill;   Maxzide  already sent in today 04/02/2024 #90, 0 refills - This is a duplicate request.  Returned because pt requesting a increased dose of the Zepbound  and the Seroquel  is non delegated.     Requested Prescriptions  Pending Prescriptions Disp Refills   triamterene -hydrochlorothiazide  (MAXZIDE -25) 37.5-25 MG tablet 90 tablet 0    Sig: Take 1 tablet by mouth daily.     Cardiovascular: Diuretic Combos Failed - 04/02/2024 10:30 AM      Failed - K in normal range and within 180 days    Potassium  Date Value Ref Range Status  12/30/2023 3.3 (L) 3.5 - 5.1 mmol/L Final  10/28/2014 3.2 (L) 3.5 - 5.1 mmol/L Final         Failed - Last BP in normal range    BP Readings from Last 1 Encounters:  12/30/23 (!) 159/88         Passed - Na in normal range and within 180 days    Sodium  Date Value Ref Range Status  12/30/2023 139 135 - 145 mmol/L Final  07/18/2020 139 137 - 147 Final  10/28/2014 141 136 - 145 mmol/L Final         Passed - Cr in normal range and within 180 days    Creat  Date Value Ref Range Status  08/08/2023 0.68 0.50 - 1.03 mg/dL Final   Creatinine, Ser  Date Value Ref Range Status  12/30/2023 0.60 0.44 - 1.00 mg/dL Final         Passed - Valid encounter within last 6 months    Recent Outpatient Visits           3 months ago Major depression in remission [facility])   [facility] [place] Walker, [name], MD               QUEtiapine  (SEROQUEL ) 25 MG tablet 90 tablet 0    Sig: Take 1 tablet (25 mg total) by mouth every  evening.     Not Delegated - Psychiatry:  Antipsychotics - Second Generation (Atypical) - quetiapine  Failed - 04/02/2024 10:30 AM      Failed - This refill cannot be delegated      Failed - TSH in normal range and within 360 days    TSH  Date Value Ref Range Status  07/17/2020 0.914 0.450 - 4.500 uIU/mL Final         Failed - Last BP in normal range    BP Readings from Last 1 Encounters:  12/30/23 (!) 159/88         Failed - Lipid Panel in normal range within the last 12 months    Cholesterol, Total  Date Value Ref Range Status  07/17/2020 152 100 - 199 mg/dL Final   Cholesterol  Date Value Ref Range Status  08/08/2023 205 (H) <200 mg/dL Final   LDL Cholesterol (Calc)  Date Value Ref Range Status  08/08/2023 116 (H) mg/dL (calc) Final    Comment:  Reference range: <100 . Desirable range <100 mg/dL for primary prevention;   <70 mg/dL for patients with CHD or diabetic patients  with > or = 2 CHD risk factors. [name] is now calculated using the Martin-Hopkins  calculation, which is a validated novel method providing  better accuracy than the Friedewald equation in the  estimation of LDL-C.  Cheyenne Walker et al. [name]. 7986;689(80): 2061-2068  (http://education.[facility].com/faq/FAQ164)    HDL  Date Value Ref Range Status  08/08/2023 62 > OR = 50 mg/dL Final  89/74/7978 46 >60 mg/dL Final   Triglycerides  Date Value Ref Range Status  08/08/2023 154 (H) <150 mg/dL Final         Failed - CMP within normal limits and completed in the last 12 months    Albumin  Date Value Ref Range Status  08/31/2022 4.2 3.5 - 5.0 g/dL Final  89/74/7978 4.4 3.8 - 4.8 g/dL Final  97/94/7983 3.8 3.4 - 5.0 g/dL Final   Alkaline Phosphatase  Date Value Ref Range Status  08/31/2022 65 38 - 126 U/L Final  10/28/2014 62 46 - 116 Unit/L Final   Alkaline phosphatase (APISO)  Date Value Ref Range Status  08/08/2023 75 37 - 153 U/L Final   ALT  Date Value Ref Range Status   08/08/2023 19 6 - 29 U/L Final   SGPT (ALT)  Date Value Ref Range Status  10/28/2014 27 14 - 63 U/L Final   AST  Date Value Ref Range Status  08/08/2023 17 10 - 35 U/L Final   SGOT(AST)  Date Value Ref Range Status  10/28/2014 27 15 - 37 Unit/L Final   BUN  Date Value Ref Range Status  12/30/2023 23 (H) 6 - 20 mg/dL Final  89/74/7978 12 6 - 24 mg/dL Final  97/94/7983 15 7 - 18 mg/dL Final   Calcium  Date Value Ref Range Status  12/30/2023 9.1 8.9 - 10.3 mg/dL Final   Calcium, Total  Date Value Ref Range Status  10/28/2014 8.8 8.5 - 10.1 mg/dL Final   CO2  Date Value Ref Range Status  12/30/2023 26 22 - 32 mmol/L Final   Co2  Date Value Ref Range Status  10/28/2014 28 21 - 32 mmol/L Final   Creat  Date Value Ref Range Status  08/08/2023 0.68 0.50 - 1.03 mg/dL Final   Creatinine, Ser  Date Value Ref Range Status  12/30/2023 0.60 0.44 - 1.00 mg/dL Final   Glucose  Date Value Ref Range Status  07/18/2020 81  Final  10/28/2014 83 65 - 99 mg/dL Final   Glucose, Bld  Date Value Ref Range Status  12/30/2023 71 70 - 99 mg/dL Final    Comment:    Glucose reference range applies only to samples taken after fasting for at least 8 hours.   Glucose-Capillary  Date Value Ref Range Status  12/30/2023 96 70 - 99 mg/dL Final    Comment:    Glucose reference range applies only to samples taken after fasting for at least 8 hours.   Potassium  Date Value Ref Range Status  12/30/2023 3.3 (L) 3.5 - 5.1 mmol/L Final  10/28/2014 3.2 (L) 3.5 - 5.1 mmol/L Final   Sodium  Date Value Ref Range Status  12/30/2023 139 135 - 145 mmol/L Final  07/18/2020 139 137 - 147 Final  10/28/2014 141 136 - 145 mmol/L Final   Total Bilirubin  Date Value Ref Range Status  08/08/2023 0.3 0.2 - 1.2 mg/dL Final  Bilirubin,Total  Date Value Ref Range Status  10/28/2014 0.4 0.2 - 1.0 mg/dL Final   Bilirubin Total  Date Value Ref Range Status  07/17/2020 0.2 0.0 - 1.2 mg/dL Final    Protein, ur  Date Value Ref Range Status  12/30/2023 NEGATIVE NEGATIVE mg/dL Final   Total Protein  Date Value Ref Range Status  08/08/2023 7.5 6.1 - 8.1 g/dL Final  89/74/7978 6.7 6.0 - 8.5 g/dL Final  97/94/7983 7.4 6.4 - 8.2 g/dL Final   GFR, Est African American  Date Value Ref Range Status  12/16/2018 87 > OR = 60 mL/min/1.19m2 Final   GFR calc Af Amer  Date Value Ref Range Status  07/18/2020 125  Final   eGFR  Date Value Ref Range Status  08/08/2023 106 > OR = 60 mL/min/1.64m2 Final   GFR, Est Non African American  Date Value Ref Range Status  12/16/2018 75 > OR = 60 mL/min/1.67m2 Final   GFR, Estimated  Date Value Ref Range Status  12/30/2023 >60 >60 mL/min Final    Comment:    (NOTE) Calculated using the CKD-EPI Creatinine Equation (2021)          Passed - Completed PHQ-2 or PHQ-9 in the last 360 days      Passed - Last Heart Rate in normal range    Pulse Readings from Last 1 Encounters:  12/30/23 (!) 57         Passed - Valid encounter within last 6 months    Recent Outpatient Visits           3 months ago Major depression in remission [facility])   [place] [facility] [place], Dorette, MD              Passed - CBC within normal limits and completed in the last 12 months    WBC  Date Value Ref Range Status  12/30/2023 7.5 4.0 - 10.5 K/uL Final   RBC  Date Value Ref Range Status  12/30/2023 4.81 3.87 - 5.11 MIL/uL Final   Hemoglobin  Date Value Ref Range Status  12/30/2023 14.0 12.0 - 15.0 g/dL Final  89/74/7978 86.1 11.1 - 15.9 g/dL Final   HCT  Date Value Ref Range Status  12/30/2023 41.9 36.0 - 46.0 % Final   Hematocrit  Date Value Ref Range Status  07/17/2020 42.1 34.0 - 46.6 % Final   MCHC  Date Value Ref Range Status  12/30/2023 33.4 30.0 - 36.0 g/dL Final   [facility]  Date Value Ref Range Status  12/30/2023 29.1 26.0 - 34.0 pg Final   MCV  Date Value Ref Range Status  12/30/2023 87.1 80.0 - 100.0 fL Final   07/17/2020 91 79 - 97 fL Final  10/28/2014 90 80 - 100 fL Final   No results found for: PLTCOUNTKUC, LABPLAT, POCPLA RDW  Date Value Ref Range Status  12/30/2023 14.2 11.5 - 15.5 % Final  07/17/2020 13.4 11.7 - 15.4 % Final  10/28/2014 14.6 (H) 11.5 - 14.5 % Final

## 2024-04-02 NOTE — Telephone Encounter (Signed)
 Requested medications are due for refill today.  Yes - both  Requested medications are on the active medications list.  Yes - both  Last refill. Zepbound  02/25/2024 2mL 0 rf, Seroquel  12/11/2023 #90 0 rf  Future visit scheduled.   no  Notes to clinic.  Zepbound  is not assigned to a protocol. Seroquel  is not delegated.    Requested Prescriptions  Pending Prescriptions Disp Refills   tirzepatide  (ZEPBOUND ) 5 MG/0.5ML Pen 2 mL 0    Sig: Inject 5 mg into the skin once a week.     Off-Protocol Failed - 04/02/2024 10:26 AM      Failed - Medication not assigned to a protocol, review manually.      Passed - Valid encounter within last 12 months    Recent Outpatient Visits           3 months ago Major depression in remission [facility])   [place] [facility] [place], [name], MD               QUEtiapine  (SEROQUEL ) 25 MG tablet 90 tablet 0    Sig: Take 1 tablet (25 mg total) by mouth every evening.     Not Delegated - Psychiatry:  Antipsychotics - Second Generation (Atypical) - quetiapine  Failed - 04/02/2024 10:26 AM      Failed - This refill cannot be delegated      Failed - TSH in normal range and within 360 days    TSH  Date Value Ref Range Status  07/17/2020 0.914 0.450 - 4.500 uIU/mL Final         Failed - Last BP in normal range    BP Readings from Last 1 Encounters:  12/30/23 (!) 159/88         Failed - Lipid Panel in normal range within the last 12 months    Cholesterol, Total  Date Value Ref Range Status  07/17/2020 152 100 - 199 mg/dL Final   Cholesterol  Date Value Ref Range Status  08/08/2023 205 (H) <200 mg/dL Final   LDL Cholesterol (Calc)  Date Value Ref Range Status  08/08/2023 116 (H) mg/dL (calc) Final    Comment:    Reference range: <100 . Desirable range <100 mg/dL for primary prevention;   <70 mg/dL for patients with CHD or diabetic patients  with > or = 2 CHD risk factors. [name] is now calculated using the Martin-Hopkins   calculation, which is a validated novel method providing  better accuracy than the Friedewald equation in the  estimation of LDL-C.  Gladis APPLETHWAITE et al. [name]. 7986;689(80): 2061-2068  (http://education.[facility].com/faq/FAQ164)    HDL  Date Value Ref Range Status  08/08/2023 62 > OR = 50 mg/dL Final  89/74/7978 46 >60 mg/dL Final   Triglycerides  Date Value Ref Range Status  08/08/2023 154 (H) <150 mg/dL Final         Failed - CMP within normal limits and completed in the last 12 months    Albumin  Date Value Ref Range Status  08/31/2022 4.2 3.5 - 5.0 g/dL Final  89/74/7978 4.4 3.8 - 4.8 g/dL Final  97/94/7983 3.8 3.4 - 5.0 g/dL Final   Alkaline Phosphatase  Date Value Ref Range Status  08/31/2022 65 38 - 126 U/L Final  10/28/2014 62 46 - 116 Unit/L Final   Alkaline phosphatase (APISO)  Date Value Ref Range Status  08/08/2023 75 37 - 153 U/L Final   ALT  Date Value Ref Range Status  08/08/2023 19  6 - 29 U/L Final   SGPT (ALT)  Date Value Ref Range Status  10/28/2014 27 14 - 63 U/L Final   AST  Date Value Ref Range Status  08/08/2023 17 10 - 35 U/L Final   SGOT(AST)  Date Value Ref Range Status  10/28/2014 27 15 - 37 Unit/L Final   BUN  Date Value Ref Range Status  12/30/2023 23 (H) 6 - 20 mg/dL Final  89/74/7978 12 6 - 24 mg/dL Final  97/94/7983 15 7 - 18 mg/dL Final   Calcium  Date Value Ref Range Status  12/30/2023 9.1 8.9 - 10.3 mg/dL Final   Calcium, Total  Date Value Ref Range Status  10/28/2014 8.8 8.5 - 10.1 mg/dL Final   CO2  Date Value Ref Range Status  12/30/2023 26 22 - 32 mmol/L Final   Co2  Date Value Ref Range Status  10/28/2014 28 21 - 32 mmol/L Final   Creat  Date Value Ref Range Status  08/08/2023 0.68 0.50 - 1.03 mg/dL Final   Creatinine, Ser  Date Value Ref Range Status  12/30/2023 0.60 0.44 - 1.00 mg/dL Final   Glucose  Date Value Ref Range Status  07/18/2020 81  Final  10/28/2014 83 65 - 99 mg/dL Final    Glucose, Bld  Date Value Ref Range Status  12/30/2023 71 70 - 99 mg/dL Final    Comment:    Glucose reference range applies only to samples taken after fasting for at least 8 hours.   Glucose-Capillary  Date Value Ref Range Status  12/30/2023 96 70 - 99 mg/dL Final    Comment:    Glucose reference range applies only to samples taken after fasting for at least 8 hours.   Potassium  Date Value Ref Range Status  12/30/2023 3.3 (L) 3.5 - 5.1 mmol/L Final  10/28/2014 3.2 (L) 3.5 - 5.1 mmol/L Final   Sodium  Date Value Ref Range Status  12/30/2023 139 135 - 145 mmol/L Final  07/18/2020 139 137 - 147 Final  10/28/2014 141 136 - 145 mmol/L Final   Total Bilirubin  Date Value Ref Range Status  08/08/2023 0.3 0.2 - 1.2 mg/dL Final   Bilirubin,Total  Date Value Ref Range Status  10/28/2014 0.4 0.2 - 1.0 mg/dL Final   Bilirubin Total  Date Value Ref Range Status  07/17/2020 0.2 0.0 - 1.2 mg/dL Final   Protein, ur  Date Value Ref Range Status  12/30/2023 NEGATIVE NEGATIVE mg/dL Final   Total Protein  Date Value Ref Range Status  08/08/2023 7.5 6.1 - 8.1 g/dL Final  89/74/7978 6.7 6.0 - 8.5 g/dL Final  97/94/7983 7.4 6.4 - 8.2 g/dL Final   GFR, Est African American  Date Value Ref Range Status  12/16/2018 87 > OR = 60 mL/min/1.2m2 Final   GFR calc Af Amer  Date Value Ref Range Status  07/18/2020 125  Final   eGFR  Date Value Ref Range Status  08/08/2023 106 > OR = 60 mL/min/1.43m2 Final   GFR, Est Non African American  Date Value Ref Range Status  12/16/2018 75 > OR = 60 mL/min/1.63m2 Final   GFR, Estimated  Date Value Ref Range Status  12/30/2023 >60 >60 mL/min Final    Comment:    (NOTE) Calculated using the CKD-EPI Creatinine Equation (2021)          Passed - Completed PHQ-2 or PHQ-9 in the last 360 days      Passed - Last Heart Rate in normal  range    Pulse Readings from Last 1 Encounters:  12/30/23 (!) 57         Passed - Valid encounter  within last 6 months    Recent Outpatient Visits           3 months ago Major depression in remission [facility])   [place] [facility] [place], Dorette, MD              Passed - CBC within normal limits and completed in the last 12 months    WBC  Date Value Ref Range Status  12/30/2023 7.5 4.0 - 10.5 K/uL Final   RBC  Date Value Ref Range Status  12/30/2023 4.81 3.87 - 5.11 MIL/uL Final   Hemoglobin  Date Value Ref Range Status  12/30/2023 14.0 12.0 - 15.0 g/dL Final  89/74/7978 86.1 11.1 - 15.9 g/dL Final   HCT  Date Value Ref Range Status  12/30/2023 41.9 36.0 - 46.0 % Final   Hematocrit  Date Value Ref Range Status  07/17/2020 42.1 34.0 - 46.6 % Final   MCHC  Date Value Ref Range Status  12/30/2023 33.4 30.0 - 36.0 g/dL Final   [facility]  Date Value Ref Range Status  12/30/2023 29.1 26.0 - 34.0 pg Final   MCV  Date Value Ref Range Status  12/30/2023 87.1 80.0 - 100.0 fL Final  07/17/2020 91 79 - 97 fL Final  10/28/2014 90 80 - 100 fL Final   No results found for: PLTCOUNTKUC, LABPLAT, POCPLA RDW  Date Value Ref Range Status  12/30/2023 14.2 11.5 - 15.5 % Final  07/17/2020 13.4 11.7 - 15.4 % Final  10/28/2014 14.6 (H) 11.5 - 14.5 % Final         Signed Prescriptions Disp Refills   triamterene -hydrochlorothiazide  (MAXZIDE -25) 37.5-25 MG tablet 90 tablet 0    Sig: Take 1 tablet by mouth daily.     Cardiovascular: Diuretic Combos Failed - 04/02/2024 10:26 AM      Failed - K in normal range and within 180 days    Potassium  Date Value Ref Range Status  12/30/2023 3.3 (L) 3.5 - 5.1 mmol/L Final  10/28/2014 3.2 (L) 3.5 - 5.1 mmol/L Final         Failed - Last BP in normal range    BP Readings from Last 1 Encounters:  12/30/23 (!) 159/88         Passed - Na in normal range and within 180 days    Sodium  Date Value Ref Range Status  12/30/2023 139 135 - 145 mmol/L Final  07/18/2020 139 137 - 147 Final  10/28/2014 141 136 - 145  mmol/L Final         Passed - Cr in normal range and within 180 days    Creat  Date Value Ref Range Status  08/08/2023 0.68 0.50 - 1.03 mg/dL Final   Creatinine, Ser  Date Value Ref Range Status  12/30/2023 0.60 0.44 - 1.00 mg/dL Final         Passed - Valid encounter within last 6 months    Recent Outpatient Visits           3 months ago Major depression in remission [facility])   [facility] [name], [name], MD

## 2024-04-02 NOTE — Telephone Encounter (Signed)
 Requested Prescriptions  Pending Prescriptions Disp Refills   tirzepatide  (ZEPBOUND ) 5 MG/0.5ML Pen 2 mL 0    Sig: Inject 5 mg into the skin once a week.     Off-Protocol Failed - 04/02/2024 10:26 AM      Failed - Medication not assigned to a protocol, review manually.      Passed - Valid encounter within last 12 months    Recent Outpatient Visits           3 months ago Major depression in remission [facility])   [place] [facility] [place], [name], MD               triamterene -hydrochlorothiazide  (MAXZIDE -25) 37.5-25 MG tablet 90 tablet 0    Sig: Take 1 tablet by mouth daily.     Cardiovascular: Diuretic Combos Failed - 04/02/2024 10:26 AM      Failed - K in normal range and within 180 days    Potassium  Date Value Ref Range Status  12/30/2023 3.3 (L) 3.5 - 5.1 mmol/L Final  10/28/2014 3.2 (L) 3.5 - 5.1 mmol/L Final         Failed - Last BP in normal range    BP Readings from Last 1 Encounters:  12/30/23 (!) 159/88         Passed - Na in normal range and within 180 days    Sodium  Date Value Ref Range Status  12/30/2023 139 135 - 145 mmol/L Final  07/18/2020 139 137 - 147 Final  10/28/2014 141 136 - 145 mmol/L Final         Passed - Cr in normal range and within 180 days    Creat  Date Value Ref Range Status  08/08/2023 0.68 0.50 - 1.03 mg/dL Final   Creatinine, Ser  Date Value Ref Range Status  12/30/2023 0.60 0.44 - 1.00 mg/dL Final         Passed - Valid encounter within last 6 months    Recent Outpatient Visits           3 months ago Major depression in remission [facility])   [place] [facility] [name], [name], MD               QUEtiapine  (SEROQUEL ) 25 MG tablet 90 tablet 0    Sig: Take 1 tablet (25 mg total) by mouth every evening.     Not Delegated - Psychiatry:  Antipsychotics - Second Generation (Atypical) - quetiapine  Failed - 04/02/2024 10:26 AM      Failed - This refill cannot be delegated      Failed -  TSH in normal range and within 360 days    TSH  Date Value Ref Range Status  07/17/2020 0.914 0.450 - 4.500 uIU/mL Final         Failed - Last BP in normal range    BP Readings from Last 1 Encounters:  12/30/23 (!) 159/88         Failed - Lipid Panel in normal range within the last 12 months    Cholesterol, Total  Date Value Ref Range Status  07/17/2020 152 100 - 199 mg/dL Final   Cholesterol  Date Value Ref Range Status  08/08/2023 205 (H) <200 mg/dL Final   LDL Cholesterol (Calc)  Date Value Ref Range Status  08/08/2023 116 (H) mg/dL (calc) Final    Comment:    Reference range: <100 . Desirable range <100 mg/dL for primary prevention;   <70 mg/dL for patients  with CHD or diabetic patients  with > or = 2 CHD risk factors. [name] is now calculated using the Martin-Hopkins  calculation, which is a validated novel method providing  better accuracy than the Friedewald equation in the  estimation of LDL-C.  Gladis APPLETHWAITE et al. [name]. 7986;689(80): 2061-2068  (http://education.[facility].com/faq/FAQ164)    HDL  Date Value Ref Range Status  08/08/2023 62 > OR = 50 mg/dL Final  89/74/7978 46 >60 mg/dL Final   Triglycerides  Date Value Ref Range Status  08/08/2023 154 (H) <150 mg/dL Final         Failed - CMP within normal limits and completed in the last 12 months    Albumin  Date Value Ref Range Status  08/31/2022 4.2 3.5 - 5.0 g/dL Final  89/74/7978 4.4 3.8 - 4.8 g/dL Final  97/94/7983 3.8 3.4 - 5.0 g/dL Final   Alkaline Phosphatase  Date Value Ref Range Status  08/31/2022 65 38 - 126 U/L Final  10/28/2014 62 46 - 116 Unit/L Final   Alkaline phosphatase (APISO)  Date Value Ref Range Status  08/08/2023 75 37 - 153 U/L Final   ALT  Date Value Ref Range Status  08/08/2023 19 6 - 29 U/L Final   SGPT (ALT)  Date Value Ref Range Status  10/28/2014 27 14 - 63 U/L Final   AST  Date Value Ref Range Status  08/08/2023 17 10 - 35 U/L Final   SGOT(AST)   Date Value Ref Range Status  10/28/2014 27 15 - 37 Unit/L Final   BUN  Date Value Ref Range Status  12/30/2023 23 (H) 6 - 20 mg/dL Final  89/74/7978 12 6 - 24 mg/dL Final  97/94/7983 15 7 - 18 mg/dL Final   Calcium  Date Value Ref Range Status  12/30/2023 9.1 8.9 - 10.3 mg/dL Final   Calcium, Total  Date Value Ref Range Status  10/28/2014 8.8 8.5 - 10.1 mg/dL Final   CO2  Date Value Ref Range Status  12/30/2023 26 22 - 32 mmol/L Final   Co2  Date Value Ref Range Status  10/28/2014 28 21 - 32 mmol/L Final   Creat  Date Value Ref Range Status  08/08/2023 0.68 0.50 - 1.03 mg/dL Final   Creatinine, Ser  Date Value Ref Range Status  12/30/2023 0.60 0.44 - 1.00 mg/dL Final   Glucose  Date Value Ref Range Status  07/18/2020 81  Final  10/28/2014 83 65 - 99 mg/dL Final   Glucose, Bld  Date Value Ref Range Status  12/30/2023 71 70 - 99 mg/dL Final    Comment:    Glucose reference range applies only to samples taken after fasting for at least 8 hours.   Glucose-Capillary  Date Value Ref Range Status  12/30/2023 96 70 - 99 mg/dL Final    Comment:    Glucose reference range applies only to samples taken after fasting for at least 8 hours.   Potassium  Date Value Ref Range Status  12/30/2023 3.3 (L) 3.5 - 5.1 mmol/L Final  10/28/2014 3.2 (L) 3.5 - 5.1 mmol/L Final   Sodium  Date Value Ref Range Status  12/30/2023 139 135 - 145 mmol/L Final  07/18/2020 139 137 - 147 Final  10/28/2014 141 136 - 145 mmol/L Final   Total Bilirubin  Date Value Ref Range Status  08/08/2023 0.3 0.2 - 1.2 mg/dL Final   Bilirubin,Total  Date Value Ref Range Status  10/28/2014 0.4 0.2 - 1.0 mg/dL Final  Bilirubin Total  Date Value Ref Range Status  07/17/2020 0.2 0.0 - 1.2 mg/dL Final   Protein, ur  Date Value Ref Range Status  12/30/2023 NEGATIVE NEGATIVE mg/dL Final   Total Protein  Date Value Ref Range Status  08/08/2023 7.5 6.1 - 8.1 g/dL Final  89/74/7978 6.7 6.0 - 8.5  g/dL Final  97/94/7983 7.4 6.4 - 8.2 g/dL Final   GFR, Est African American  Date Value Ref Range Status  12/16/2018 87 > OR = 60 mL/min/1.62m2 Final   GFR calc Af Amer  Date Value Ref Range Status  07/18/2020 125  Final   eGFR  Date Value Ref Range Status  08/08/2023 106 > OR = 60 mL/min/1.19m2 Final   GFR, Est Non African American  Date Value Ref Range Status  12/16/2018 75 > OR = 60 mL/min/1.10m2 Final   GFR, Estimated  Date Value Ref Range Status  12/30/2023 >60 >60 mL/min Final    Comment:    (NOTE) Calculated using the CKD-EPI Creatinine Equation (2021)          Passed - Completed PHQ-2 or PHQ-9 in the last 360 days      Passed - Last Heart Rate in normal range    Pulse Readings from Last 1 Encounters:  12/30/23 (!) 57         Passed - Valid encounter within last 6 months    Recent Outpatient Visits           3 months ago Major depression in remission [facility])   [facility] [place], Dorette, MD              Passed - CBC within normal limits and completed in the last 12 months    WBC  Date Value Ref Range Status  12/30/2023 7.5 4.0 - 10.5 K/uL Final   RBC  Date Value Ref Range Status  12/30/2023 4.81 3.87 - 5.11 MIL/uL Final   Hemoglobin  Date Value Ref Range Status  12/30/2023 14.0 12.0 - 15.0 g/dL Final  89/74/7978 86.1 11.1 - 15.9 g/dL Final   HCT  Date Value Ref Range Status  12/30/2023 41.9 36.0 - 46.0 % Final   Hematocrit  Date Value Ref Range Status  07/17/2020 42.1 34.0 - 46.6 % Final   MCHC  Date Value Ref Range Status  12/30/2023 33.4 30.0 - 36.0 g/dL Final   [facility]  Date Value Ref Range Status  12/30/2023 29.1 26.0 - 34.0 pg Final   MCV  Date Value Ref Range Status  12/30/2023 87.1 80.0 - 100.0 fL Final  07/17/2020 91 79 - 97 fL Final  10/28/2014 90 80 - 100 fL Final   No results found for: PLTCOUNTKUC, LABPLAT, POCPLA RDW  Date Value Ref Range Status  12/30/2023 14.2 11.5 - 15.5 % Final   07/17/2020 13.4 11.7 - 15.4 % Final  10/28/2014 14.6 (H) 11.5 - 14.5 % Final

## 2024-04-05 ENCOUNTER — Encounter: Payer: Self-pay | Admitting: Family Medicine

## 2024-04-05 ENCOUNTER — Ambulatory Visit (INDEPENDENT_AMBULATORY_CARE_PROVIDER_SITE_OTHER): Admitting: Family Medicine

## 2024-04-05 DIAGNOSIS — F339 Major depressive disorder, recurrent, unspecified: Secondary | ICD-10-CM | POA: Diagnosis not present

## 2024-04-05 DIAGNOSIS — E8881 Metabolic syndrome: Secondary | ICD-10-CM | POA: Diagnosis not present

## 2024-04-05 DIAGNOSIS — F419 Anxiety disorder, unspecified: Secondary | ICD-10-CM | POA: Diagnosis not present

## 2024-04-05 DIAGNOSIS — M1731 Unilateral post-traumatic osteoarthritis, right knee: Secondary | ICD-10-CM

## 2024-04-05 DIAGNOSIS — F5105 Insomnia due to other mental disorder: Secondary | ICD-10-CM

## 2024-04-05 DIAGNOSIS — I10 Essential (primary) hypertension: Secondary | ICD-10-CM

## 2024-04-05 DIAGNOSIS — E559 Vitamin D deficiency, unspecified: Secondary | ICD-10-CM

## 2024-04-05 MED ORDER — TRIAMTERENE-HCTZ 37.5-25 MG PO TABS: 1.0000 | ORAL_TABLET | Freq: Every day | ORAL | 0 refills | Status: DC

## 2024-04-05 MED ORDER — HYALURONIC ACID 100 MG PO CAPS: 850.0000 mg | ORAL_CAPSULE | Freq: Every day | ORAL | Status: AC

## 2024-04-05 MED ORDER — ZEPBOUND 5 MG/0.5ML ~~LOC~~ SOAJ: 5.0000 mg | SUBCUTANEOUS | 0 refills | Status: DC

## 2024-04-05 MED ORDER — QUETIAPINE FUMARATE 25 MG PO TABS: 25.0000 mg | ORAL_TABLET | Freq: Every evening | ORAL | 0 refills | Status: DC

## 2024-04-05 NOTE — Progress Notes (Signed)
 Name: Cheyenne Walker   MRN: [id number]    DOB: June 09, 1971   Date:04/05/2024       Progress Note  Subjective  Chief Complaint  Chief Complaint  Patient presents with   Knee Pain    pt believes its related to previous falls hurting from R knee, seen UC at [facility] previously and had x-rays, was given cortisone shots no help   Discussed the use of AI scribe software for clinical note transcription with the patient, who gave verbal consent to proceed.  History of Present Illness Cheyenne Walker is a 53 year old female with primary osteoarthritis of the right knee who presents for follow-up and medication refill.  She visited urgent care on May 7th for acute right knee pain, where an x-ray was performed, after that she was referred to Ortho and  received a corticosteroid injection from Dr. Sharrie  at the end of may. She states it  did not alleviate her symptoms. She continues to experience persistent pain, difficulty bearing weight, and significant pain when bending the knee. Pain is exacerbated when her grandchildren step on her knee.   She uses hydrocodone and diclofenac  for pain management, but the pain recurs. She finds relief using a hyaluronic acid supplement as needed for pain.   She has morbid obesity with a BMI of 38.79, currently weighing 226 pounds. She was previously on Zepbound  for weight management, losing weight from 236 pounds in March to 226 pounds currently. She maintains a balanced diet, avoids sodas, and reports a reduced appetite even after discontinuing Zepbound  two weeks ago, she would like to resume medication  She has hypertension and metabolic syndrome with prediabetes. Her blood pressure is slightly elevated at 134/82, and she is on triamterene  HCTZ for hypertension with no side effects.   She also has a history of insomnia related to anxiety and major depression recurrent mild , for which she takes quetiapine  for sleep, she does not want to take SSRI's . She experienced  some anxiety related to her knee issues, explained that Duloxetine  can improve pain but she would like to hold off on adding medications at this time    Patient Active Problem List   Diagnosis Date Noted   Insomnia due to mental disorder 08/11/2023   Periumbilical abdominal pain 08/11/2023   Screening for colon cancer    Adenomatous polyp of transverse colon    Adenomatous polyp of ascending colon    Lumbar radiculopathy (L5) 10/08/2018   Chronic pain syndrome 10/08/2018   DDD (degenerative disc disease), lumbosacral 05/01/2017   Anxiety 04/03/2015   Benign essential HTN 04/03/2015   Allergic rhinitis 04/03/2015   Gastro-esophageal reflux disease without esophagitis 04/03/2015   Major depression, recurrent, chronic (HCC) 04/03/2015   Morbid obesity with BMI of 40.0-44.9, adult (HCC) 04/03/2015   Dysmetabolic syndrome 04/03/2015   Numerous moles 04/03/2015   Plantar fasciitis 04/03/2015   PTSD (post-traumatic stress disorder) 04/03/2015   Vitamin D  deficiency 04/03/2015   Umbilical hernia without obstruction and without gangrene 04/03/2015    Past Surgical History:  Procedure Laterality Date   CHOLECYSTECTOMY  2006   COLONOSCOPY WITH PROPOFOL  N/A 06/05/2022   Procedure: COLONOSCOPY WITH PROPOFOL ;  Surgeon: Unk Corinn Skiff, MD;  Location: [facility] ENDOSCOPY;  Service: Gastroenterology;  Laterality: N/A;   HERNIA REPAIR  10/2014    Family History  Problem Relation Age of Onset   Hypertension Mother    Hypertension Sister    Hypertension Brother     Social History  Tobacco Use   Smoking status: Never    Passive exposure: Never   Smokeless tobacco: Never  Substance Use Topics   Alcohol use: No    Alcohol/week: 0.0 standard drinks of alcohol     Current Outpatient Medications:    fexofenadine (ALLEGRA) 180 MG tablet, Take 180 mg by mouth daily., Disp: , Rfl:    triamterene -hydrochlorothiazide  (MAXZIDE -25) 37.5-25 MG tablet, Take 1 tablet by mouth daily., Disp: 90  tablet, Rfl: 0   celecoxib  (CELEBREX ) 100 MG capsule, Take 1 capsule (100 mg total) by mouth 2 (two) times daily. (Patient not taking: Reported on 04/05/2024), Disp: 60 capsule, Rfl: 0   metFORMIN  (GLUCOPHAGE -XR) 500 MG 24 hr tablet, TAKE 1 TABLET BY MOUTH EVERY DAY WITH BREAKFAST (Patient not taking: Reported on 04/05/2024), Disp: 30 tablet, Rfl: 0   QUEtiapine  (SEROQUEL ) 25 MG tablet, Take 1 tablet (25 mg total) by mouth every evening. (Patient not taking: Reported on 04/05/2024), Disp: 90 tablet, Rfl: 0   tirzepatide  (ZEPBOUND ) 5 MG/0.5ML Pen, Inject 5 mg into the skin once a week. (Patient not taking: Reported on 04/05/2024), Disp: 2 mL, Rfl: 0   Vitamin D , Ergocalciferol , (DRISDOL ) 1.25 MG (50000 UNIT) CAPS capsule, Take 1 capsule (50,000 Units total) by mouth every 7 (seven) days. (Patient not taking: Reported on 04/05/2024), Disp: 12 capsule, Rfl: 0  Allergies  Allergen Reactions   Ace Inhibitors Other (See Comments)    Cough. Pt states specifically catopril    I personally reviewed active problem list, medication list, allergies with the patient/caregiver today.   ROS  Ten systems reviewed and is negative except as mentioned in HPI    Objective Physical Exam  CONSTITUTIONAL: Patient appears well-developed and well-nourished. No distress. HEENT: Head atraumatic, normocephalic, neck supple. CARDIOVASCULAR: Normal rate, regular rhythm and normal heart sounds. No murmur heard. No BLE edema. PULMONARY: Effort normal and breath sounds normal. Lungs clear to auscultation. No respiratory distress. ABDOMINAL: There is no tenderness or distention. MUSCULOSKELETAL: Normal gait. Crepitus, popping, and effusion in right knee. Without gross motor or sensory deficit. PSYCHIATRIC: Patient has a normal mood and affect. Behavior is normal. Judgment and thought content normal.  Vitals:   04/05/24 1125  BP: 134/82  Pulse: 85  Resp: 16  SpO2: 95%  Weight: 226 lb (102.5 kg)  Height: 5' 4 (1.626  m)    Body mass index is 38.79 kg/m.   PHQ2/9:    04/05/2024   11:21 AM 12/11/2023   10:51 AM 08/08/2023    1:40 PM 02/13/2023    9:22 AM 12/20/2022    7:48 AM  Depression screen PHQ 2/9  Decreased Interest 1 0 0 0 2  Down, Depressed, Hopeless 1 0 0 0 2  PHQ - 2 Score 2 0 0 0 4  Altered sleeping 1 0 0 0 2  Tired, decreased energy 1 0 0 1 2  Change in appetite 0 0 0 0 2  Feeling bad or failure about yourself  0 0 0 0 2  Trouble concentrating 0 0 0 0 0  Moving slowly or fidgety/restless 0 0 0 0 0  Suicidal thoughts 0 0 0 0 0  PHQ-9 Score 4 0 0 1 12  Difficult doing work/chores Somewhat difficult Not difficult at all Not difficult at all Not difficult at all Somewhat difficult    phq 9 is positive  Fall Risk:    04/05/2024   11:21 AM 08/08/2023    1:33 PM 02/13/2023    9:22 AM 12/20/2022  7:48 AM 05/13/2022    1:32 PM  Fall Risk   Falls in the past year? 1 0 0 0 1  Number falls in past yr: 1 0  0 0  Injury with Fall? 0 0  0 0  Risk for fall due to : Impaired balance/gait No Fall Risks No Fall Risks No Fall Risks History of fall(s)  Follow up Falls evaluation completed Falls prevention discussed;Education provided;Falls evaluation completed Falls prevention discussed;Education provided;Falls evaluation completed Falls prevention discussed;Education provided;Falls evaluation completed Falls prevention discussed;Education provided;Falls evaluation completed      Data saved with a previous flowsheet row definition      Assessment & Plan Primary osteoarthritis of right knee with effusion and pain Chronic osteoarthritis with effusion and acute pain exacerbation. Previous corticosteroid injection ineffective. Possible meniscal tear or loose cartilage indicated by crepitus and popping. - Refer to orthopedic specialist Dr. Kopinski for evaluation and potential arthroscopic surgery. - Advise over-the-counter hyaluronic acid supplement for pain management. - Recommend ice,  Voltaren  gel, and Tylenol  500 mg up to four times daily for pain.  Morbid obesity, BMI 38.79 with co-morbidities such as OA/prediabetes and HTN Morbid obesity with recent weight loss. Previously effective Zepbound  discontinued, resulting in weight gain. Maintains balanced diet and reduced appetite. - Prescribe Zepbound  5 mg for one month, increase to 7.5 mg if tolerated. - Encourage current dietary habits, avoiding sodas. - Schedule follow-up every three months to monitor weight and Zepbound  coverage.  Metabolic syndrome with prediabetes Metabolic syndrome with prediabetes linked to obesity and hypertension. Prefers lifestyle modifications and Zepbound  for management. - Continue lifestyle modifications with balanced diet and weight management.  Essential hypertension Hypertension managed with triamterene  HCTZ. Blood pressure slightly elevated, no medication side effects. - Continue triamterene  HCTZ 37.5/25 mg. - Encourage weight loss for blood pressure improvement.  Insomnia due to anxiety Insomnia related to anxiety, history of major depression. Increased anxiety from knee pain, quetiapine  effective for sleep. - Prescribe quetiapine  for sleep, 90-day supply. - Consider duloxetine  if depression and pain management needed.  Major depressive disorder, single episode, unspecified Major depressive disorder, not on antidepressants. Anxiety due to knee issues, otherwise well-managed. - Monitor mental health, consider duloxetine  if depressive symptoms or pain management needs arise.

## 2024-05-03 ENCOUNTER — Telehealth: Payer: Self-pay

## 2024-05-03 ENCOUNTER — Other Ambulatory Visit: Payer: Self-pay | Admitting: Family Medicine

## 2024-05-03 MED ORDER — ZEPBOUND 7.5 MG/0.5ML ~~LOC~~ SOAJ: 7.5000 mg | SUBCUTANEOUS | 0 refills | Status: DC

## 2024-05-03 NOTE — Telephone Encounter (Signed)
 Copied from CRM #[id number]. Topic: Clinical - Medication Question >> May 03, 2024  9:01 AM Cheyenne Walker wrote: Reason for CRM: The patient would like to be contacted by a member of clinical staff to discuss and confirm the increase in their prescription for tirzepatide  (ZEPBOUND ) 5 MG/0.5ML Pen [[id number]] to 7 MG  Please contact the patient further when possible

## 2024-05-11 DIAGNOSIS — G8929 Other chronic pain: Secondary | ICD-10-CM | POA: Diagnosis not present

## 2024-05-11 DIAGNOSIS — M25561 Pain in right knee: Secondary | ICD-10-CM | POA: Diagnosis not present

## 2024-05-11 DIAGNOSIS — M1711 Unilateral primary osteoarthritis, right knee: Secondary | ICD-10-CM | POA: Diagnosis not present

## 2024-05-31 ENCOUNTER — Other Ambulatory Visit: Payer: Self-pay | Admitting: Family Medicine

## 2024-05-31 NOTE — Telephone Encounter (Unsigned)
 Copied from CRM [id number]. Topic: Clinical - Medication Refill >> May 31, 2024  9:21 AM Kevelyn M wrote: Medication: tirzepatide  (ZEPBOUND ) 7.5 MG/0.5ML Pen  Has the patient contacted their pharmacy? Yes (Agent: If no, request that the patient contact the pharmacy for the refill. If patient does not wish to contact the pharmacy document the reason why and proceed with request.) (Agent: If yes, when and what did the pharmacy advise?)  This is the patient's preferred pharmacy:  [id number] - [place], [place] - [address] [place] [address] Phone: [phone] Fax: [phone]  Is this the correct pharmacy for this prescription? Yes If no, delete pharmacy and type the correct one.   Has the prescription been filled recently? No  Is the patient out of the medication? Yes  Has the patient been seen for an appointment in the last year OR does the patient have an upcoming appointment? Yes  Can we respond through MyChart? Yes  Agent: Please be advised that Rx refills may take up to 3 business days. We ask that you follow-up with your pharmacy.

## 2024-06-01 ENCOUNTER — Ambulatory Visit: Admitting: General Surgery

## 2024-06-01 MED ORDER — ZEPBOUND 7.5 MG/0.5ML ~~LOC~~ SOAJ: 7.5000 mg | SUBCUTANEOUS | 0 refills | Status: DC

## 2024-06-01 NOTE — Telephone Encounter (Signed)
 Requested medication (s) are due for refill today: Yes  Requested medication (s) are on the active medication list: Yes  Last refill:  05/03/24  Future visit scheduled: Yes  Notes to clinic:  Manual review.    Requested Prescriptions  Pending Prescriptions Disp Refills   tirzepatide  (ZEPBOUND ) 7.5 MG/0.5ML Pen 2 mL 0    Sig: Inject 7.5 mg into the skin once a week.     Off-Protocol Failed - 06/01/2024 12:37 PM      Failed - Medication not assigned to a protocol, review manually.      Passed - Valid encounter within last 12 months    Recent Outpatient Visits           1 month ago Morbid obesity [facility])   [place] [facility] Olive, Dorette, MD   5 months ago Major depression in remission [facility])   [facility] [name], [name], MD       Future Appointments             In 1 month [place], [name], MD [facility], [place]

## 2024-06-02 ENCOUNTER — Other Ambulatory Visit: Payer: Self-pay | Admitting: Family Medicine

## 2024-06-02 DIAGNOSIS — F5105 Insomnia due to other mental disorder: Secondary | ICD-10-CM

## 2024-06-10 ENCOUNTER — Ambulatory Visit: Admitting: General Surgery

## 2024-06-23 ENCOUNTER — Telehealth: Payer: Self-pay | Admitting: Pharmacy Technician

## 2024-06-23 ENCOUNTER — Other Ambulatory Visit (HOSPITAL_COMMUNITY): Payer: Self-pay

## 2024-06-23 NOTE — Telephone Encounter (Signed)
 Pharmacy Patient Advocate Encounter   Received notification from CoverMyMeds that prior authorization for Zepbound  7.5MG /0.5ML pen-injectors is due for renewal.   Insurance verification completed.   The patient is insured through [facility].  Action: PA required; PA started via CoverMyMeds. KEY [id number] . Please see clinical question(s) below that I am not finding the answer to in their chart and advise.  Patient would need to have an office visit to discuss all the plan requirements for her to continue coverage and to obtain an updated weight and BMI. Please advise.

## 2024-06-23 NOTE — Telephone Encounter (Signed)
Patient not seen in our office

## 2024-07-07 ENCOUNTER — Other Ambulatory Visit (HOSPITAL_COMMUNITY): Payer: Self-pay

## 2024-07-07 ENCOUNTER — Encounter: Payer: Self-pay | Admitting: Family Medicine

## 2024-07-07 ENCOUNTER — Telehealth: Payer: Self-pay | Admitting: Pharmacy Technician

## 2024-07-07 ENCOUNTER — Ambulatory Visit: Admitting: Family Medicine

## 2024-07-07 DIAGNOSIS — I10 Essential (primary) hypertension: Secondary | ICD-10-CM | POA: Diagnosis not present

## 2024-07-07 DIAGNOSIS — E559 Vitamin D deficiency, unspecified: Secondary | ICD-10-CM

## 2024-07-07 DIAGNOSIS — Z23 Encounter for immunization: Secondary | ICD-10-CM

## 2024-07-07 DIAGNOSIS — E538 Deficiency of other specified B group vitamins: Secondary | ICD-10-CM

## 2024-07-07 DIAGNOSIS — E8881 Metabolic syndrome: Secondary | ICD-10-CM | POA: Diagnosis not present

## 2024-07-07 DIAGNOSIS — M1731 Unilateral post-traumatic osteoarthritis, right knee: Secondary | ICD-10-CM | POA: Diagnosis not present

## 2024-07-07 DIAGNOSIS — E785 Hyperlipidemia, unspecified: Secondary | ICD-10-CM

## 2024-07-07 DIAGNOSIS — F5105 Insomnia due to other mental disorder: Secondary | ICD-10-CM

## 2024-07-07 DIAGNOSIS — F325 Major depressive disorder, single episode, in full remission: Secondary | ICD-10-CM

## 2024-07-07 MED ORDER — ZEPBOUND 10 MG/0.5ML ~~LOC~~ SOAJ: 10.0000 mg | SUBCUTANEOUS | 0 refills | Status: DC

## 2024-07-07 MED ORDER — QUETIAPINE FUMARATE 25 MG PO TABS: 25.0000 mg | ORAL_TABLET | Freq: Every evening | ORAL | 0 refills | Status: DC

## 2024-07-07 MED ORDER — TRIAMTERENE-HCTZ 37.5-25 MG PO TABS: 1.0000 | ORAL_TABLET | Freq: Every day | ORAL | 0 refills | Status: DC

## 2024-07-07 NOTE — Telephone Encounter (Signed)
 Pharmacy Patient Advocate Encounter   Received notification from CoverMyMeds that prior authorization for Zepbound  10MG /0.5ML pen-injectors is required/requested.   Insurance verification completed.   The patient is insured through [facility]   Per test claim: PA required; PA submitted to above mentioned insurance via Latent Key/confirmation #/EOC [facility] Status is pending

## 2024-07-07 NOTE — Progress Notes (Addendum)
 Name: Cheyenne Walker   MRN: [id number]    DOB: 1971-05-25   Date:07/07/2024       Progress Note  Subjective  Chief Complaint  Chief Complaint  Patient presents with   Medical Management of Chronic Issues   Discussed the use of AI scribe software for clinical note transcription with the patient, who gave verbal consent to proceed.  History of Present Illness Cheyenne Walker is a 53 year old female with obesity, hypertension, and metabolic syndrome who presents for a follow-up visit for weight management and medication regimen.  She has been taking Zepbound   for weight loss, resulting in a decrease from 236 lbs in March to 213 lbs currently, with her BMI improving from 38.79 to 36.56. She craves more fruits and vegetables and has stopped drinking sodas, stating they now taste 'horrible'. She tolerates the medication well and wants to increase the dose of Zepbound   to 10 mg for the next three months.  She has a history of hypertension and is currently taking Maxzide .  She has post-traumatic osteoarthritis in her right knee. She engages in walking and uses resistance bands for exercise, although she experiences some knee discomfort.  She is taking quetiapine  for sleep, using it every other day, and adjusting the timing to accommodate her work schedule. Her sleep is adequate with this regimen.  She has a history of metabolic syndrome and prediabetes. No symptoms such as excessive hunger, thirst, or frequent urination are present.   She is taking over-the-counter vitamin D  2000 IU daily.  Her depression and anxiety are currently in remission, and she is not taking any medication for these conditions. She feels emotionally stable.  She is due for blood work, including a lipid panel, CBC, A1c, comprehensive panel, vitamin D , and B12 levels, as her last tests were in November of the previous year.   Patient Active Problem List   Diagnosis Date Noted   Insomnia due to mental disorder  08/11/2023   Periumbilical abdominal pain 08/11/2023   Screening for colon cancer    Adenomatous polyp of transverse colon    Adenomatous polyp of ascending colon    Lumbar radiculopathy (L5) 10/08/2018   Chronic pain syndrome 10/08/2018   DDD (degenerative disc disease), lumbosacral 05/01/2017   Spinal stenosis of lumbar region 01/27/2017   Anxiety 04/03/2015   Benign essential HTN 04/03/2015   Allergic rhinitis 04/03/2015   Gastro-esophageal reflux disease without esophagitis 04/03/2015   Major depression, recurrent, chronic 04/03/2015   Morbid obesity with BMI of 40.0-44.9, adult (HCC) 04/03/2015   Dysmetabolic syndrome 04/03/2015   Numerous moles 04/03/2015   Plantar fasciitis 04/03/2015   PTSD (post-traumatic stress disorder) 04/03/2015   Vitamin D  deficiency 04/03/2015   Umbilical hernia without obstruction and without gangrene 04/03/2015    Past Surgical History:  Procedure Laterality Date   CHOLECYSTECTOMY  2006   COLONOSCOPY WITH PROPOFOL  N/A 06/05/2022   Procedure: COLONOSCOPY WITH PROPOFOL ;  Surgeon: Unk Corinn Skiff, MD;  Location: [facility] ENDOSCOPY;  Service: Gastroenterology;  Laterality: N/A;   HERNIA REPAIR  10/2014    Family History  Problem Relation Age of Onset   Hypertension Mother    Hypertension Sister    Hypertension Brother     Social History   Tobacco Use   Smoking status: Never    Passive exposure: Never   Smokeless tobacco: Never  Substance Use Topics   Alcohol use: No    Alcohol/week: 0.0 standard drinks of alcohol     Current Outpatient Medications:  acetaminophen  (TYLENOL ) 500 MG tablet, Take 500 mg by mouth every 6 (six) hours as needed for moderate pain (pain score 4-6)., Disp: , Rfl:    Cholecalciferol (VITAMIN D ) 50 MCG (2000 UT) CAPS, Take 1 capsule by mouth daily., Disp: , Rfl:    fexofenadine (ALLEGRA) 180 MG tablet, Take 180 mg by mouth daily., Disp: , Rfl:    QUEtiapine  (SEROQUEL ) 25 MG tablet, Take 1 tablet (25 mg total)  by mouth every evening., Disp: 90 tablet, Rfl: 0   Sodium Hyaluronate, oral, (HYALURONIC ACID) 100 MG CAPS, Take 850 mg by mouth daily., Disp: , Rfl:    tirzepatide  (ZEPBOUND ) 7.5 MG/0.5ML Pen, Inject 7.5 mg into the skin once a week., Disp: 2 mL, Rfl: 0   triamterene -hydrochlorothiazide  (MAXZIDE -25) 37.5-25 MG tablet, Take 1 tablet by mouth daily., Disp: 90 tablet, Rfl: 0  Allergies  Allergen Reactions   Ace Inhibitors Other (See Comments)    Cough. Pt states specifically catopril    I personally reviewed active problem list, medication list, allergies, family history with the patient/caregiver today.   ROS  Ten systems reviewed and is negative except as mentioned in HPI    Objective Physical Exam VITALS: BP- 116/74 MEASUREMENTS: Weight- 213, BMI- 36.56. CONSTITUTIONAL: Patient appears well-developed and well-nourished. No distress. HEENT: Head atraumatic, normocephalic, neck supple. CARDIOVASCULAR: Normal rate, regular rhythm and normal heart sounds. No murmur heard. No BLE edema. PULMONARY: Effort normal and breath sounds normal. Lungs clear to auscultation bilaterally. No respiratory distress. ABDOMINAL: There is no tenderness or distention. MUSCULOSKELETAL: Normal gait. Without gross motor or sensory deficit. PSYCHIATRIC: Patient has a normal mood and affect. Behavior is normal. Judgment and thought content normal.  Vitals:   07/07/24 0851  BP: 116/74  Pulse: 77  Resp: 16  SpO2: 97%  Weight: 213 lb (96.6 kg)  Height: 5' 4 (1.626 m)    Body mass index is 36.56 kg/m.   PHQ2/9:    07/07/2024    8:44 AM 04/05/2024   11:21 AM 12/11/2023   10:51 AM 08/08/2023    1:40 PM 02/13/2023    9:22 AM  Depression screen PHQ 2/9  Decreased Interest 0 1 0 0 0  Down, Depressed, Hopeless 0 1 0 0 0  PHQ - 2 Score 0 2 0 0 0  Altered sleeping 0 1 0 0 0  Tired, decreased energy 0 1 0 0 1  Change in appetite 0 0 0 0 0  Feeling bad or failure about yourself  0 0 0 0 0  Trouble  concentrating 0 0 0 0 0  Moving slowly or fidgety/restless 0 0 0 0 0  Suicidal thoughts 0 0 0 0 0  PHQ-9 Score 0 4 0 0 1  Difficult doing work/chores Not difficult at all Somewhat difficult Not difficult at all Not difficult at all Not difficult at all    phq 9 is negative  Fall Risk:    07/07/2024    8:44 AM 04/05/2024   11:21 AM 08/08/2023    1:33 PM 02/13/2023    9:22 AM 12/20/2022    7:48 AM  Fall Risk   Falls in the past year? 0 1 0 0 0  Number falls in past yr: 0 1 0  0  Injury with Fall? 0 0 0  0  Risk for fall due to : No Fall Risks Impaired balance/gait No Fall Risks No Fall Risks No Fall Risks  Follow up Falls evaluation completed Falls evaluation completed Falls prevention discussed;Education provided;Falls  evaluation completed Falls prevention discussed;Education provided;Falls evaluation completed Falls prevention discussed;Education provided;Falls evaluation completed      Assessment & Plan Morbid obesity with metabolic syndrome Morbid obesity with BMI 36.56, improved from 38.79. Weight decreased from 236 lbs to 213 lbs. Metabolic syndrome with hypertension and prediabetes. Weight loss crucial for blood pressure and insulin  resistance improvement. Tolerating Zepbound  well with positive dietary changes. - Increase Zepbound  to 10 mg, 4-month supply. - Encourage healthy eating and physical activity. - Monitor weight and BMI at follow-up.  Hypertension Blood pressure controlled at 116/74 mmHg on maxide. Potential dose reduction if weight loss continues and blood pressure remains low. - Continue maxide. - Monitor blood pressure at home. - Consider dose reduction if blood pressure <100 mmHg or symptomatic hypotension.  Prediabetes Prediabetes as part of metabolic syndrome. No hyperglycemia symptoms. Weight loss and GLP-1 therapy primary treatments. - Order A1c to monitor glucose control.  Dyslipidemia Dyslipidemia with previous elevated triglycerides and LDL, improved  HDL. Anticipated improvement with weight loss and medication. - Order lipid panel.  Post-traumatic osteoarthritis of right knee Symptoms improving with weight loss, reducing knee pressure. Encouraged low-impact exercises. - Encourage resistance bands and upper body exercises. - Continue weight loss to reduce knee pain.  Insomnia Insomnia managed with quetiapine  25 mg every other day. No dependency or significant side effects. - Continue quetiapine  25 mg as needed. - Adjust dose timing for adequate rest.  Depression in remission Depression in remission. Emotionally stable.  General Health Maintenance Due for physical exam. Mammogram up to date. Discussed vaccinations; agreed to flu shot today. - Administer flu shot. - Schedule physical exam. - Discuss pneumonia and shingles vaccines next visit.

## 2024-07-08 ENCOUNTER — Ambulatory Visit: Payer: Self-pay | Admitting: Family Medicine

## 2024-07-08 LAB — CBC WITH DIFFERENTIAL/PLATELET
Absolute Lymphocytes: 3165 {cells}/uL (ref 850–3900)
Absolute Monocytes: 447 {cells}/uL (ref 200–950)
Basophils Absolute: 108 {cells}/uL (ref 0–200)
Basophils Relative: 1.4 %
Eosinophils Absolute: 231 {cells}/uL (ref 15–500)
Eosinophils Relative: 3 %
HCT: 40.9 % (ref 35.0–45.0)
Hemoglobin: 13.6 g/dL (ref 11.7–15.5)
MCH: 30.2 pg (ref 27.0–33.0)
MCHC: 33.3 g/dL (ref 32.0–36.0)
MCV: 90.9 fL (ref 80.0–100.0)
MPV: 10.5 fL (ref 7.5–12.5)
Monocytes Relative: 5.8 %
Neutro Abs: 3750 {cells}/uL (ref 1500–7800)
Neutrophils Relative %: 48.7 %
Platelets: 381 Thousand/uL (ref 140–400)
RBC: 4.5 Million/uL (ref 3.80–5.10)
RDW: 13.9 % (ref 11.0–15.0)
Total Lymphocyte: 41.1 %
WBC: 7.7 Thousand/uL (ref 3.8–10.8)

## 2024-07-08 LAB — LIPID PANEL
Cholesterol: 175 mg/dL (ref <200)
HDL: 55 mg/dL (ref >=50)
LDL Cholesterol (Calc): 97 mg/dL
Non-HDL Cholesterol (Calc): 120 mg/dL (ref <130)
Total CHOL/HDL Ratio: 3.2 (calc) (ref <5.0)
Triglycerides: 124 mg/dL (ref <150)

## 2024-07-08 LAB — B12 AND FOLATE PANEL
Folate: 16.5 ng/mL
Vitamin B-12: 414 pg/mL (ref 200–1100)

## 2024-07-08 LAB — COMPREHENSIVE METABOLIC PANEL WITH GFR
AG Ratio: 1.7 (calc) (ref 1.0–2.5)
ALT: 16 U/L (ref 6–29)
AST: 19 U/L (ref 10–35)
Albumin: 4.6 g/dL (ref 3.6–5.1)
Alkaline phosphatase (APISO): 64 U/L (ref 37–153)
BUN: 16 mg/dL (ref 7–25)
CO2: 28 mmol/L (ref 20–32)
Calcium: 9.6 mg/dL (ref 8.6–10.4)
Chloride: 104 mmol/L (ref 98–110)
Creat: 0.77 mg/dL (ref 0.50–1.03)
Globulin: 2.7 g/dL (ref 1.9–3.7)
Glucose, Bld: 86 mg/dL (ref 65–99)
Potassium: 3.9 mmol/L (ref 3.5–5.3)
Sodium: 142 mmol/L (ref 135–146)
Total Bilirubin: 0.5 mg/dL (ref 0.2–1.2)
Total Protein: 7.3 g/dL (ref 6.1–8.1)
eGFR: 93 mL/min/1.73m2 (ref >=60)

## 2024-07-08 LAB — HEMOGLOBIN A1C
Hgb A1c MFr Bld: 5.1 % (ref <5.7)
Mean Plasma Glucose: 100 mg/dL
eAG (mmol/L): 5.5 mmol/L

## 2024-07-08 LAB — VITAMIN D 25 HYDROXY (VIT D DEFICIENCY, FRACTURES): Vit D, 25-Hydroxy: 37 ng/mL (ref 30–100)

## 2024-07-09 ENCOUNTER — Other Ambulatory Visit (HOSPITAL_COMMUNITY): Payer: Self-pay

## 2024-07-09 NOTE — Telephone Encounter (Signed)
 Pharmacy Patient Advocate Encounter  Received notification from [facility] that Prior Authorization for Zepbound  10MG /0.5ML pen-injectors has been APPROVED from 07/08/24 to 07/05/25. Unable to obtain price due to refill too soon rejection, last fill date 07/08/24 next available fill date11/06/25   PA #/Case ID/Reference #: [id number]

## 2024-09-03 ENCOUNTER — Other Ambulatory Visit: Payer: Self-pay | Admitting: Family Medicine

## 2024-09-03 DIAGNOSIS — F5105 Insomnia due to other mental disorder: Secondary | ICD-10-CM

## 2024-09-06 NOTE — Telephone Encounter (Signed)
 Requested medication (s) are due for refill today: yes  Requested medication (s) are on the active medication list: yes  Last refill:  07/07/24  Future visit scheduled: yes  Notes to clinic:  Unable to refill per protocol, cannot delegate.      Requested Prescriptions  Pending Prescriptions Disp Refills   QUEtiapine  (SEROQUEL ) 25 MG tablet [Pharmacy Med Name: QUETIAPINE  FUMARATE 25 MG TAB] 60 tablet 1    Sig: TAKE 1 TABLET BY MOUTH EVERY EVENING     Not Delegated - Psychiatry:  Antipsychotics - Second Generation (Atypical) - quetiapine  Failed - 09/06/2024 12:24 PM      Failed - This refill cannot be delegated      Failed - TSH in normal range and within 360 days    TSH  Date Value Ref Range Status  07/17/2020 0.914 0.450 - 4.500 uIU/mL Final         Failed - Lipid Panel in normal range within the last 12 months    Cholesterol, Total  Date Value Ref Range Status  07/17/2020 152 100 - 199 mg/dL Final   Cholesterol  Date Value Ref Range Status  07/07/2024 175 <200 mg/dL Final   LDL Cholesterol (Calc)  Date Value Ref Range Status  07/07/2024 97 mg/dL (calc) Final    Comment:    Reference range: <100 . Desirable range <100 mg/dL for primary prevention;   <70 mg/dL for patients with CHD or diabetic patients  with > or = 2 CHD risk factors. [name] is now calculated using the Martin-Hopkins  calculation, which is a validated novel method providing  better accuracy than the Friedewald equation in the  estimation of LDL-C.  Cheyenne Walker et al. [name]. 7986;689(80): 2061-2068  (http://education.[facility].com/faq/FAQ164)    HDL  Date Value Ref Range Status  07/07/2024 55 > OR = 50 mg/dL Final  89/74/7978 46 >60 mg/dL Final   Triglycerides  Date Value Ref Range Status  07/07/2024 124 <150 mg/dL Final         Passed - Completed PHQ-2 or PHQ-9 in the last 360 days      Passed - Last BP in normal range    BP Readings from Last 1 Encounters:  07/07/24 116/74          Passed - Last Heart Rate in normal range    Pulse Readings from Last 1 Encounters:  07/07/24 77         Passed - Valid encounter within last 6 months    Recent Outpatient Visits           2 months ago Morbid obesity [facility])   [facility] Glen[place] Mire, MD   5 months ago Morbid obesity [facility])   [facility] [place], Mire, MD   9 months ago Major depression in remission   [facility] Glen[facility] Mire, MD              Passed - CBC within normal limits and completed in the last 12 months    WBC  Date Value Ref Range Status  07/07/2024 7.7 3.8 - 10.8 Thousand/uL Final   RBC  Date Value Ref Range Status  07/07/2024 4.50 3.80 - 5.10 Million/uL Final   Hemoglobin  Date Value Ref Range Status  07/07/2024 13.6 11.7 - 15.5 g/dL Final  89/74/7978 86.1 11.1 - 15.9 g/dL Final   HCT  Date Value Ref Range Status  07/07/2024 40.9 35.0 - 45.0 % Final  Hematocrit  Date Value Ref Range Status  07/17/2020 42.1 34.0 - 46.6 % Final   MCHC  Date Value Ref Range Status  07/07/2024 33.3 32.0 - 36.0 g/dL Final    Comment:    For adults, a slight decrease in the calculated MCHC value (in the range of 30 to 32 g/dL) is most likely not clinically significant; however, it should be interpreted with caution in correlation with other red cell parameters and the patient's clinical condition.    [facility]  Date Value Ref Range Status  07/07/2024 30.2 27.0 - 33.0 pg Final   MCV  Date Value Ref Range Status  07/07/2024 90.9 80.0 - 100.0 fL Final  07/17/2020 91 79 - 97 fL Final  10/28/2014 90 80 - 100 fL Final   No results found for: PLTCOUNTKUC, LABPLAT, POCPLA RDW  Date Value Ref Range Status  07/07/2024 13.9 11.0 - 15.0 % Final  07/17/2020 13.4 11.7 - 15.4 % Final  10/28/2014 14.6 (H) 11.5 - 14.5 % Final         Passed - CMP within normal limits and completed in the last 12 months     Albumin  Date Value Ref Range Status  08/31/2022 4.2 3.5 - 5.0 g/dL Final  89/74/7978 4.4 3.8 - 4.8 g/dL Final  97/94/7983 3.8 3.4 - 5.0 g/dL Final   Alkaline Phosphatase  Date Value Ref Range Status  08/31/2022 65 38 - 126 U/L Final  10/28/2014 62 46 - 116 Unit/L Final   Alkaline phosphatase (APISO)  Date Value Ref Range Status  07/07/2024 64 37 - 153 U/L Final   ALT  Date Value Ref Range Status  07/07/2024 16 6 - 29 U/L Final   SGPT (ALT)  Date Value Ref Range Status  10/28/2014 27 14 - 63 U/L Final   AST  Date Value Ref Range Status  07/07/2024 19 10 - 35 U/L Final   SGOT(AST)  Date Value Ref Range Status  10/28/2014 27 15 - 37 Unit/L Final   BUN  Date Value Ref Range Status  07/07/2024 16 7 - 25 mg/dL Final  89/74/7978 12 6 - 24 mg/dL Final  97/94/7983 15 7 - 18 mg/dL Final   Calcium  Date Value Ref Range Status  07/07/2024 9.6 8.6 - 10.4 mg/dL Final   Calcium, Total  Date Value Ref Range Status  10/28/2014 8.8 8.5 - 10.1 mg/dL Final   CO2  Date Value Ref Range Status  07/07/2024 28 20 - 32 mmol/L Final   Co2  Date Value Ref Range Status  10/28/2014 28 21 - 32 mmol/L Final   Creat  Date Value Ref Range Status  07/07/2024 0.77 0.50 - 1.03 mg/dL Final   Glucose  Date Value Ref Range Status  07/18/2020 81  Final  10/28/2014 83 65 - 99 mg/dL Final   Glucose, Bld  Date Value Ref Range Status  07/07/2024 86 65 - 99 mg/dL Final    Comment:    .            Fasting reference interval .    Glucose-Capillary  Date Value Ref Range Status  12/30/2023 96 70 - 99 mg/dL Final    Comment:    Glucose reference range applies only to samples taken after fasting for at least 8 hours.   Potassium  Date Value Ref Range Status  07/07/2024 3.9 3.5 - 5.3 mmol/L Final  10/28/2014 3.2 (L) 3.5 - 5.1 mmol/L Final   Sodium  Date Value Ref Range  Status  07/07/2024 142 135 - 146 mmol/L Final  07/18/2020 139 137 - 147 Final  10/28/2014 141 136 - 145 mmol/L  Final   Total Bilirubin  Date Value Ref Range Status  07/07/2024 0.5 0.2 - 1.2 mg/dL Final   Bilirubin,Total  Date Value Ref Range Status  10/28/2014 0.4 0.2 - 1.0 mg/dL Final   Bilirubin Total  Date Value Ref Range Status  07/17/2020 0.2 0.0 - 1.2 mg/dL Final   Protein, ur  Date Value Ref Range Status  12/30/2023 NEGATIVE NEGATIVE mg/dL Final   Total Protein  Date Value Ref Range Status  07/07/2024 7.3 6.1 - 8.1 g/dL Final  89/74/7978 6.7 6.0 - 8.5 g/dL Final  97/94/7983 7.4 6.4 - 8.2 g/dL Final   GFR, Est African American  Date Value Ref Range Status  12/16/2018 87 > OR = 60 mL/min/1.32m2 Final   GFR calc Af Amer  Date Value Ref Range Status  07/18/2020 125  Final   eGFR  Date Value Ref Range Status  07/07/2024 93 > OR = 60 mL/min/1.4m2 Final   GFR, Est Non African American  Date Value Ref Range Status  12/16/2018 75 > OR = 60 mL/min/1.75m2 Final   GFR, Estimated  Date Value Ref Range Status  12/30/2023 >60 >60 mL/min Final    Comment:    (NOTE) Calculated using the CKD-EPI Creatinine Equation (2021)

## 2024-09-14 NOTE — Patient Instructions (Signed)
 Preventive Care 51-51 Years Old, Female  Preventive care refers to lifestyle choices and visits with your health care provider that can promote health and wellness. Preventive care visits are also called wellness exams.  What can I expect for my preventive care visit?  Counseling  Your health care provider may ask you questions about your:  Medical history, including:  Past medical problems.  Family medical history.  Pregnancy history.  Current health, including:  Menstrual cycle.  Method of birth control.  Emotional well-being.  Home life and relationship well-being.  Sexual activity and sexual health.  Lifestyle, including:  Alcohol, nicotine or tobacco, and drug use.  Access to firearms.  Diet, exercise, and sleep habits.  Work and work Astronomer.  Sunscreen use.  Safety issues such as seatbelt and bike helmet use.  Physical exam  Your health care provider will check your:  Height and weight. These may be used to calculate your BMI (body mass index). BMI is a measurement that tells if you are at a healthy weight.  Waist circumference. This measures the distance around your waistline. This measurement also tells if you are at a healthy weight and may help predict your risk of certain diseases, such as type 2 diabetes and high blood pressure.  Heart rate and blood pressure.  Body temperature.  Skin for abnormal spots.  What immunizations do I need?    Vaccines are usually given at various ages, according to a schedule. Your health care provider will recommend vaccines for you based on your age, medical history, and lifestyle or other factors, such as travel or where you work.  What tests do I need?  Screening  Your health care provider may recommend screening tests for certain conditions. This may include:  Lipid and cholesterol levels.  Diabetes screening. This is done by checking your blood sugar (glucose) after you have not eaten for a while (fasting).  Pelvic exam and Pap test.  Hepatitis B test.  Hepatitis C  test.  HIV (human immunodeficiency virus) test.  STI (sexually transmitted infection) testing, if you are at risk.  Lung cancer screening.  Colorectal cancer screening.  Mammogram. Talk with your health care provider about when you should start having regular mammograms. This may depend on whether you have a family history of breast cancer.  BRCA-related cancer screening. This may be done if you have a family history of breast, ovarian, tubal, or peritoneal cancers.  Bone density scan. This is done to screen for osteoporosis.  Talk with your health care provider about your test results, treatment options, and if necessary, the need for more tests.  Follow these instructions at home:  Eating and drinking    Eat a diet that includes fresh fruits and vegetables, whole grains, lean protein, and low-fat dairy products.  Take vitamin and mineral supplements as recommended by your health care provider.  Do not drink alcohol if:  Your health care provider tells you not to drink.  You are pregnant, may be pregnant, or are planning to become pregnant.  If you drink alcohol:  Limit how much you have to 0-1 drink a day.  Know how much alcohol is in your drink. In the [place] one drink equals one 12 oz bottle of beer (355 mL), one 5 oz glass of wine (148 mL), or one 1 oz glass of hard liquor (44 mL).  Lifestyle  Brush your teeth every morning and night with fluoride toothpaste. Floss one time each day.  Exercise for at least  30 minutes 5 or more days each week.  Do not use any products that contain nicotine or tobacco. These products include cigarettes, chewing tobacco, and vaping devices, such as e-cigarettes. If you need help quitting, ask your health care provider.  Do not use drugs.  If you are sexually active, practice safe sex. Use a condom or other form of protection to prevent STIs.  If you do not wish to become pregnant, use a form of birth control. If you plan to become pregnant, see your health care provider for a  prepregnancy visit.  Take aspirin only as told by your health care provider. Make sure that you understand how much to take and what form to take. Work with your health care provider to find out whether it is safe and beneficial for you to take aspirin daily.  Find healthy ways to manage stress, such as:  Meditation, yoga, or listening to music.  Journaling.  Talking to a trusted person.  Spending time with friends and family.  Minimize exposure to UV radiation to reduce your risk of skin cancer.  Safety  Always wear your seat belt while driving or riding in a vehicle.  Do not drive:  If you have been drinking alcohol. Do not ride with someone who has been drinking.  When you are tired or distracted.  While texting.  If you have been using any mind-altering substances or drugs.  Wear a helmet and other protective equipment during sports activities.  If you have firearms in your house, make sure you follow all gun safety procedures.  Seek help if you have been physically or sexually abused.  What's next?  Visit your health care provider once a year for an annual wellness visit.  Ask your health care provider how often you should have your eyes and teeth checked.  Stay up to date on all vaccines.  This information is not intended to replace advice given to you by your health care provider. Make sure you discuss any questions you have with your health care provider.  Document Revised: 03/07/2021 Document Reviewed: 03/07/2021  Elsevier Patient Education  2024 [employer].

## 2024-09-20 ENCOUNTER — Ambulatory Visit (INDEPENDENT_AMBULATORY_CARE_PROVIDER_SITE_OTHER): Admitting: Family Medicine

## 2024-09-20 ENCOUNTER — Encounter: Payer: Self-pay | Admitting: Family Medicine

## 2024-09-20 VITALS — BP 126/76 | HR 74 | Resp 16 | Ht 64.0 in | Wt 207.6 lb

## 2024-09-20 DIAGNOSIS — Z0000 Encounter for general adult medical examination without abnormal findings: Secondary | ICD-10-CM

## 2024-09-20 DIAGNOSIS — E2839 Other primary ovarian failure: Secondary | ICD-10-CM | POA: Diagnosis not present

## 2024-09-20 DIAGNOSIS — Z1231 Encounter for screening mammogram for malignant neoplasm of breast: Secondary | ICD-10-CM

## 2024-09-20 DIAGNOSIS — D229 Melanocytic nevi, unspecified: Secondary | ICD-10-CM

## 2024-09-20 DIAGNOSIS — Z13820 Encounter for screening for osteoporosis: Secondary | ICD-10-CM
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
## 2024-09-26 ENCOUNTER — Other Ambulatory Visit: Payer: Self-pay | Admitting: Family Medicine

## 2024-09-27 DIAGNOSIS — F5105 Insomnia due to other mental disorder: Secondary | ICD-10-CM

## 2024-09-27 NOTE — Telephone Encounter (Signed)
 Copied from CRM [id number]. Topic: Clinical - Medication Refill >> Sep 27, 2024  8:18 AM Rosaria E wrote: Medication: QUEtiapine  (SEROQUEL ) 25 MG tablet  Has the patient contacted their pharmacy? Yes (Agent: If no, request that the patient contact the pharmacy for the refill. If patient does not wish to contact the pharmacy document the reason why and proceed with request.) (Agent: If yes, when and what did the pharmacy advise?)  This is the patient's preferred pharmacy:  [id number] - [place], [place] - [address] [place] [address] Phone: [phone] Fax: [phone]  Is this the correct pharmacy for this prescription? Yes If no, delete pharmacy and type the correct one.   Has the prescription been filled recently? Yes  Is the patient out of the medication? Yes  Has the patient been seen for an appointment in the last year OR does the patient have an upcoming appointment? Yes  Can we respond through MyChart? Yes  Agent: Please be advised that Rx refills may take up to 3 business days. We ask that you follow-up with your pharmacy.

## 2024-09-28 NOTE — Telephone Encounter (Signed)
 Requested medications are due for refill today.  yes  Requested medications are on the active medications list.  yes  Last refill. 07/07/2024 #90 0 rf  Future visit scheduled.   yes  Notes to clinic.  Refill not delegated.   Requested Prescriptions  Pending Prescriptions Disp Refills   QUEtiapine  (SEROQUEL ) 25 MG tablet 90 tablet 0    Sig: Take 1 tablet (25 mg total) by mouth every evening.     Not Delegated - Psychiatry:  Antipsychotics - Second Generation (Atypical) - quetiapine  Failed - 09/28/2024  1:23 PM      Failed - This refill cannot be delegated      Failed - TSH in normal range and within 360 days    TSH  Date Value Ref Range Status  07/17/2020 0.914 0.450 - 4.500 uIU/mL Final         Failed - Lipid Panel in normal range within the last 12 months    Cholesterol, Total  Date Value Ref Range Status  07/17/2020 152 100 - 199 mg/dL Final   Cholesterol  Date Value Ref Range Status  07/07/2024 175 <200 mg/dL Final   LDL Cholesterol (Calc)  Date Value Ref Range Status  07/07/2024 97 mg/dL (calc) Final    Comment:    Reference range: <100 . Desirable range <100 mg/dL for primary prevention;   <70 mg/dL for patients with CHD or diabetic patients  with > or = 2 CHD risk factors. [name] is now calculated using the Martin-Hopkins  calculation, which is a validated novel method providing  better accuracy than the Friedewald equation in the  estimation of LDL-C.  Cheyenne Walker et al. [name]. 7986;689(80): 2061-2068  (http://education.[facility].com/faq/FAQ164)    HDL  Date Value Ref Range Status  07/07/2024 55 > OR = 50 mg/dL Final  89/74/7978 46 >60 mg/dL Final   Triglycerides  Date Value Ref Range Status  07/07/2024 124 <150 mg/dL Final         Passed - Completed PHQ-2 or PHQ-9 in the last 360 days      Passed - Last BP in normal range    BP Readings from Last 1 Encounters:  09/20/24 126/76         Passed - Last Heart Rate in normal range    Pulse  Readings from Last 1 Encounters:  09/20/24 74         Passed - Valid encounter within last 6 months    Recent Outpatient Visits           1 week ago Well adult exam   [facility] Glenard Mire, MD   2 months ago Morbid obesity [facility])   [facility] Glenard Mire, MD   5 months ago Morbid obesity [facility])   [facility] Glenard Mire, MD   9 months ago Major depression in remission   [facility] Glenard Mire, MD       Future Appointments             Tomorrow Raymund Lauraine BROCKS, MD [facility]            Passed - CBC within normal limits and completed in the last 12 months    WBC  Date Value Ref Range Status  07/07/2024 7.7 3.8 - 10.8 Thousand/uL Final   RBC  Date Value Ref Range Status  07/07/2024 4.50 3.80 - 5.10 Million/uL Final   Hemoglobin  Date  Value Ref Range Status  07/07/2024 13.6 11.7 - 15.5 g/dL Final  89/74/7978 86.1 11.1 - 15.9 g/dL Final   HCT  Date Value Ref Range Status  07/07/2024 40.9 35.0 - 45.0 % Final   Hematocrit  Date Value Ref Range Status  07/17/2020 42.1 34.0 - 46.6 % Final   MCHC  Date Value Ref Range Status  07/07/2024 33.3 32.0 - 36.0 g/dL Final    Comment:    For adults, a slight decrease in the calculated MCHC value (in the range of 30 to 32 g/dL) is most likely not clinically significant; however, it should be interpreted with caution in correlation with other red cell parameters and the patient's clinical condition.    [facility]  Date Value Ref Range Status  07/07/2024 30.2 27.0 - 33.0 pg Final   MCV  Date Value Ref Range Status  07/07/2024 90.9 80.0 - 100.0 fL Final  07/17/2020 91 79 - 97 fL Final  10/28/2014 90 80 - 100 fL Final   No results found for: PLTCOUNTKUC, LABPLAT, POCPLA RDW  Date Value Ref Range Status  07/07/2024 13.9 11.0 - 15.0 % Final  07/17/2020 13.4 11.7 - 15.4 %  Final  10/28/2014 14.6 (H) 11.5 - 14.5 % Final         Passed - CMP within normal limits and completed in the last 12 months    Albumin  Date Value Ref Range Status  08/31/2022 4.2 3.5 - 5.0 g/dL Final  89/74/7978 4.4 3.8 - 4.8 g/dL Final  97/94/7983 3.8 3.4 - 5.0 g/dL Final   Alkaline Phosphatase  Date Value Ref Range Status  08/31/2022 65 38 - 126 U/L Final  10/28/2014 62 46 - 116 Unit/L Final   Alkaline phosphatase (APISO)  Date Value Ref Range Status  07/07/2024 64 37 - 153 U/L Final   ALT  Date Value Ref Range Status  07/07/2024 16 6 - 29 U/L Final   SGPT (ALT)  Date Value Ref Range Status  10/28/2014 27 14 - 63 U/L Final   AST  Date Value Ref Range Status  07/07/2024 19 10 - 35 U/L Final   SGOT(AST)  Date Value Ref Range Status  10/28/2014 27 15 - 37 Unit/L Final   BUN  Date Value Ref Range Status  07/07/2024 16 7 - 25 mg/dL Final  89/74/7978 12 6 - 24 mg/dL Final  97/94/7983 15 7 - 18 mg/dL Final   Calcium  Date Value Ref Range Status  07/07/2024 9.6 8.6 - 10.4 mg/dL Final   Calcium, Total  Date Value Ref Range Status  10/28/2014 8.8 8.5 - 10.1 mg/dL Final   CO2  Date Value Ref Range Status  07/07/2024 28 20 - 32 mmol/L Final   Co2  Date Value Ref Range Status  10/28/2014 28 21 - 32 mmol/L Final   Creat  Date Value Ref Range Status  07/07/2024 0.77 0.50 - 1.03 mg/dL Final   Glucose  Date Value Ref Range Status  07/18/2020 81  Final  10/28/2014 83 65 - 99 mg/dL Final   Glucose, Bld  Date Value Ref Range Status  07/07/2024 86 65 - 99 mg/dL Final    Comment:    .            Fasting reference interval .    Glucose-Capillary  Date Value Ref Range Status  12/30/2023 96 70 - 99 mg/dL Final    Comment:    Glucose reference range applies only to samples taken after fasting  for at least 8 hours.   Potassium  Date Value Ref Range Status  07/07/2024 3.9 3.5 - 5.3 mmol/L Final  10/28/2014 3.2 (L) 3.5 - 5.1 mmol/L Final   Sodium  Date  Value Ref Range Status  07/07/2024 142 135 - 146 mmol/L Final  07/18/2020 139 137 - 147 Final  10/28/2014 141 136 - 145 mmol/L Final   Total Bilirubin  Date Value Ref Range Status  07/07/2024 0.5 0.2 - 1.2 mg/dL Final   Bilirubin,Total  Date Value Ref Range Status  10/28/2014 0.4 0.2 - 1.0 mg/dL Final   Bilirubin Total  Date Value Ref Range Status  07/17/2020 0.2 0.0 - 1.2 mg/dL Final   Protein, ur  Date Value Ref Range Status  12/30/2023 NEGATIVE NEGATIVE mg/dL Final   Total Protein  Date Value Ref Range Status  07/07/2024 7.3 6.1 - 8.1 g/dL Final  89/74/7978 6.7 6.0 - 8.5 g/dL Final  97/94/7983 7.4 6.4 - 8.2 g/dL Final   GFR, Est African American  Date Value Ref Range Status  12/16/2018 87 > OR = 60 mL/min/1.66m2 Final   GFR calc Af Amer  Date Value Ref Range Status  07/18/2020 125  Final   eGFR  Date Value Ref Range Status  07/07/2024 93 > OR = 60 mL/min/1.54m2 Final   GFR, Est Non African American  Date Value Ref Range Status  12/16/2018 75 > OR = 60 mL/min/1.98m2 Final   GFR, Estimated  Date Value Ref Range Status  12/30/2023 >60 >60 mL/min Final    Comment:    (NOTE) Calculated using the CKD-EPI Creatinine Equation (2021)

## 2024-09-28 NOTE — Telephone Encounter (Signed)
 Requested medication (s) are due for refill today: yes  Requested medication (s) are on the active medication list: yes  Last refill:  07/07/24  Future visit scheduled: {no  Notes to clinic:  Medication not assigned to a protocol, review manually.      Requested Prescriptions  Pending Prescriptions Disp Refills   ZEPBOUND  10 MG/0.5ML Pen [Pharmacy Med Name: ZEPBOUND  10 MG/0.5 ML PEN]  2    Sig: INJECT 10 MG INTO THE SKIN ONE TIME PER WEEK     Off-Protocol Failed - 09/28/2024 10:58 AM      Failed - Medication not assigned to a protocol, review manually.      Passed - Valid encounter within last 12 months    Recent Outpatient Visits           1 week ago Well adult exam   [facility] Glenard Mire, MD   2 months ago Morbid obesity [facility])   [facility] Glenard Mire, MD   5 months ago Morbid obesity [facility])   [facility] Glenard Mire, MD   9 months ago Major depression in remission   [facility] [name], [name], MD       Future Appointments             Tomorrow Raymund, Lauraine BROCKS, MD [facility]

## 2024-09-29 ENCOUNTER — Ambulatory Visit

## 2024-09-29 DIAGNOSIS — W908XXA Exposure to other nonionizing radiation, initial encounter: Secondary | ICD-10-CM

## 2024-09-29 DIAGNOSIS — D235 Other benign neoplasm of skin of trunk: Secondary | ICD-10-CM

## 2024-09-29 DIAGNOSIS — Z1283 Encounter for screening for malignant neoplasm of skin: Secondary | ICD-10-CM

## 2024-09-29 DIAGNOSIS — L821 Other seborrheic keratosis: Secondary | ICD-10-CM

## 2024-09-29 DIAGNOSIS — D229 Melanocytic nevi, unspecified: Secondary | ICD-10-CM

## 2024-09-29 DIAGNOSIS — D1801 Hemangioma of skin and subcutaneous tissue: Secondary | ICD-10-CM

## 2024-09-29 DIAGNOSIS — L578 Other skin changes due to chronic exposure to nonionizing radiation: Secondary | ICD-10-CM | POA: Diagnosis not present

## 2024-09-29 DIAGNOSIS — L814 Other melanin hyperpigmentation: Secondary | ICD-10-CM | POA: Diagnosis not present

## 2024-09-29 DIAGNOSIS — L918 Other hypertrophic disorders of the skin: Secondary | ICD-10-CM | POA: Diagnosis not present

## 2024-09-29 DIAGNOSIS — D239 Other benign neoplasm of skin, unspecified: Secondary | ICD-10-CM

## 2024-09-29 NOTE — Patient Instructions (Addendum)

## 2024-09-29 NOTE — Progress Notes (Signed)
" °  °  Subjective   Cheyenne Walker is a 54 y.o. female who presents for the following: Total body skin exam for skin cancer screening and mole check. The patient has spots, moles and lesions to be evaluated, some may be new or changing and the patient may have concern these could be cancer.. Patient is new patient  Today patient reports: Area of concern on posterior neck  Review of Systems:    No other skin or systemic complaints except as noted in HPI or Assessment and Plan.  The following portions of the chart were reviewed this encounter and updated as appropriate: medications, allergies, medical history  Relevant Medical History:  n/a   Objective  (SKPE) Well appearing patient in no apparent distress; mood and affect are within normal limits. Examination was performed of the: Full Skin Examination: scalp, head, eyes, ears, nose, lips, neck, chest, axillae, abdomen, back, buttocks, bilateral upper extremities, bilateral lower extremities, hands, feet, fingers, toes, fingernails, and toenails.   Examination notable for: Angioma(s): Scattered red vascular papule(s)  , Lentigo/lentigines: Scattered pigmented macules that are tan to brown in color and are somewhat non-uniform in shape and concentrated in the sun-exposed areas, Nevus/nevi: Scattered well-demarcated, regular, pigmented macule(s) and/or papule(s)  , Seborrheic Keratosis(es): Stuck-on appearing keratotic papule(s) on the trunk, some  irritated with redness, crusting, edema, and/or partial avulsion, Actinic Damage/Elastosis: chronic sun damage: dyspigmentation, telangiectasia, and wrinkling - Single 4-30mm well-demarcated, symmetric, papule with regular borders, uniform blue-black color located on the right buttock   Examination limited by: Undergarments and Patient deferred removal       Assessment & Plan  (SKAP)   SKIN CANCER SCREENING PERFORMED TODAY.  BENIGN SKIN FINDINGS  - Lentigines  - Seborrheic keratoses  -  Hemangiomas   - Nevus/Multiple Benign Nevi  - Blue Nevus right buttock   - Skin tags  - Reassurance provided regarding the benign appearance of lesions noted on exam today; no treatment is indicated in the absence of symptoms/changes. - Reinforced importance of photoprotective strategies including liberal and frequent sunscreen use of a broad-spectrum SPF 30 or greater, use of protective clothing, and sun avoidance for prevention of cutaneous malignancy and photoaging.  Counseled patient on the importance of regular self-skin monitoring as well as routine clinical skin examinations as scheduled.   ACTINIC DAMAGE - Chronic condition, secondary to cumulative UV/sun exposure - Recommend daily broad spectrum sunscreen SPF 30+ to sun-exposed areas, reapply every 2 hours as needed.  - Staying in the shade or wearing long sleeves, sun glasses (UVA+UVB protection) and wide brim hats (4-inch brim around the entire circumference of the hat) are also recommended for sun protection.  - Call for new or changing lesions.   Was sun protection counseling provided?: Yes   Level of service outlined above   Patient instructions (SKPI)   Procedures, orders, diagnosis for this visit:    There are no diagnoses linked to this encounter.  Return to clinic: Return in about 1 year (around 09/29/2025) for TBSE.  I, Emerick Ege, CMA am acting as scribe for Lauraine JAYSON Kanaris, MD.   Documentation: I have reviewed the above documentation for accuracy and completeness, and I agree with the above.  Lauraine JAYSON Kanaris, MD  "

## 2024-10-15 ENCOUNTER — Encounter: Payer: Self-pay | Admitting: Family Medicine

## 2024-10-15 ENCOUNTER — Ambulatory Visit (INDEPENDENT_AMBULATORY_CARE_PROVIDER_SITE_OTHER): Admitting: Family Medicine

## 2024-10-15 DIAGNOSIS — F5104 Psychophysiologic insomnia: Secondary | ICD-10-CM

## 2024-10-15 DIAGNOSIS — E785 Hyperlipidemia, unspecified: Secondary | ICD-10-CM | POA: Diagnosis not present

## 2024-10-15 DIAGNOSIS — E559 Vitamin D deficiency, unspecified: Secondary | ICD-10-CM

## 2024-10-15 DIAGNOSIS — E538 Deficiency of other specified B group vitamins: Secondary | ICD-10-CM

## 2024-10-15 DIAGNOSIS — I10 Essential (primary) hypertension: Secondary | ICD-10-CM | POA: Diagnosis not present

## 2024-10-15 DIAGNOSIS — J069 Acute upper respiratory infection, unspecified: Secondary | ICD-10-CM

## 2024-10-15 LAB — POC COVID19/FLU A&B COMBO
Covid Antigen, POC: NEGATIVE
Influenza A Antigen, POC: NEGATIVE
Influenza B Antigen, POC: NEGATIVE

## 2024-10-15 MED ORDER — TRIAMTERENE-HCTZ 37.5-25 MG PO TABS: 1.0000 | ORAL_TABLET | Freq: Every day | ORAL | 1 refills | Status: AC

## 2024-10-15 MED ORDER — ZEPBOUND 12.5 MG/0.5ML ~~LOC~~ SOAJ: 12.5000 mg | SUBCUTANEOUS | 0 refills | Status: AC

## 2024-10-15 MED ORDER — BENZONATATE 100 MG PO CAPS: 100.0000 mg | ORAL_CAPSULE | Freq: Three times a day (TID) | ORAL | 0 refills | Status: AC | PRN

## 2024-10-15 MED ORDER — QUETIAPINE FUMARATE 25 MG PO TABS: 25.0000 mg | ORAL_TABLET | Freq: Every evening | ORAL | 1 refills | Status: AC

## 2024-10-15 NOTE — Progress Notes (Signed)
 Name: Cheyenne Walker   MRN: [id number]    DOB: [birth year]   Date:10/15/2024       Progress Note  Subjective  Chief Complaint  Chief Complaint  Patient presents with   Medical Management of Chronic Issues   Discussed the use of AI scribe software for clinical note transcription with the patient, who gave verbal consent to proceed.  History of Present Illness Cheyenne Walker is a 54 year old female with hypertension who presents with a sore throat and congestion.  She has been experiencing a sore throat and congestion since yesterday, without a runny nose. She recently returned from vacation and noticed her grandson had a runny nose, which she suspects may have contributed to her symptoms. She has been using over-the-counter medications such as Micanol, which usually helps quickly, but it has not been effective this time. She has also been using saline spray and gargling with warm salted water.  She has a history of hypertension and is currently taking triamterene  HCTZ 37.5/25 mg. Her blood pressure was previously 126/76 mmHg. She reports not feeling well today.  She is managing her weight with Zepbound , which she started in March when she weighed 236 lbs. She successfully reduced her weight to 208 lbs but has recently increased to 215.3 lbs due to vacation and her daughter's wedding. She reports no side effects from Zepbound  and finds it effective in curbing her appetite. She has been more mindful about her diet since she returned from vacation and will try to lose the weight she gained by next visit   She has chronic insomnia, previously linked to depression, and has not taken her sleep medication for two months. She reports not having had good sleep during this period and confirms that the medication helps her sleep when taken.  No symptoms of diabetes such as excessive hunger, thirst, or frequent urination. Her lipid panel has improved with Zepbound   with triglycerides and bad cholesterol  levels normalizing. She is taking B12 and vitamin D  supplements, with her last vitamin D  level being good and B12 being okay.    Patient Active Problem List   Diagnosis Date Noted   Insomnia due to mental disorder 08/11/2023   Periumbilical abdominal pain 08/11/2023   Screening for colon cancer    Adenomatous polyp of transverse colon    Adenomatous polyp of ascending colon    Lumbar radiculopathy (L5) 10/08/2018   Chronic pain syndrome 10/08/2018   DDD (degenerative disc disease), lumbosacral 05/01/2017   Spinal stenosis of lumbar region 01/27/2017   Anxiety 04/03/2015   Benign essential HTN 04/03/2015   Allergic rhinitis 04/03/2015   Gastro-esophageal reflux disease without esophagitis 04/03/2015   Major depression, recurrent, chronic 04/03/2015   Morbid obesity with BMI of 40.0-44.9, adult (HCC) 04/03/2015   Dysmetabolic syndrome 04/03/2015   Numerous moles 04/03/2015   Plantar fasciitis 04/03/2015   PTSD (post-traumatic stress disorder) 04/03/2015   Vitamin D  deficiency 04/03/2015   Umbilical hernia without obstruction and without gangrene 04/03/2015    Past Surgical History:  Procedure Laterality Date   CHOLECYSTECTOMY  2006   COLONOSCOPY WITH PROPOFOL  N/A 06/05/2022   Procedure: COLONOSCOPY WITH PROPOFOL ;  Surgeon: Unk Corinn Skiff, MD;  Location: [facility] ENDOSCOPY;  Service: Gastroenterology;  Laterality: N/A;   HERNIA REPAIR  10/2014    Family History  Problem Relation Age of Onset   Hypertension Mother    Hypertension Sister    Hypertension Brother     Social History   Tobacco Use  Smoking status: Never    Passive exposure: Never   Smokeless tobacco: Never  Substance Use Topics   Alcohol use: No    Alcohol/week: 0.0 standard drinks of alcohol    Current Medications[1]  Allergies[2]  I personally reviewed active problem list, medication list, allergies, family history with the patient/caregiver today.   ROS  Ten systems reviewed and is negative  except as mentioned in HPI    Objective Physical Exam  CONSTITUTIONAL: Patient appears well-developed and well-nourished. No distress. HEENT: Head atraumatic, normocephalic, neck supple. CARDIOVASCULAR: Normal rate, regular rhythm and normal heart sounds. No murmur heard. No BLE edema. PULMONARY: Effort normal and breath sounds normal. Lungs clear to auscultation bilaterally. No respiratory distress. ABDOMINAL: There is no tenderness or distention. MUSCULOSKELETAL: Normal gait. Without gross motor or sensory deficit. PSYCHIATRIC: Patient has a normal mood and affect. Behavior is normal. Judgment and thought content normal.  Vitals:   10/15/24 0800  BP: 132/76  Pulse: 72  Resp: 16  SpO2: 97%  Weight: 215 lb 4.8 oz (97.7 kg)  Height: 5' 4 (1.626 m)    Body mass index is 36.96 kg/m.    PHQ2/9:    10/15/2024    7:59 AM 09/20/2024    7:55 AM 07/07/2024    8:44 AM 04/05/2024   11:21 AM 12/11/2023   10:51 AM  Depression screen PHQ 2/9  Decreased Interest 0 0 0 1 0  Down, Depressed, Hopeless 0 0 0 1 0  PHQ - 2 Score 0 0 0 2 0  Altered sleeping 0  0 1 0  Tired, decreased energy 0  0 1 0  Change in appetite 0  0 0 0  Feeling bad or failure about yourself  0  0 0 0  Trouble concentrating 0  0 0 0  Moving slowly or fidgety/restless 0  0 0 0  Suicidal thoughts 0  0 0 0  PHQ-9 Score 0  0  4  0   Difficult doing work/chores Not difficult at all  Not difficult at all Somewhat difficult Not difficult at all     Data saved with a previous flowsheet row definition    phq 9 is negative  Fall Risk:    10/15/2024    7:59 AM 09/20/2024    7:55 AM 07/07/2024    8:44 AM 04/05/2024   11:21 AM 08/08/2023    1:33 PM  Fall Risk   Falls in the past year? 0 0 0 1 0  Number falls in past yr: 0 0 0 1 0  Injury with Fall? 0 0 0  0  0   Risk for fall due to : No Fall Risks No Fall Risks No Fall Risks Impaired balance/gait No Fall Risks  Follow up Falls evaluation completed Falls  evaluation completed Falls evaluation completed Falls evaluation completed Falls prevention discussed;Education provided;Falls evaluation completed     Data saved with a previous flowsheet row definition      Assessment & Plan Acute upper respiratory infection Symptoms likely viral. COVID and flu tests negative. - Use saline spray and warm salted water gargles. - Prescribed Tessalon  Perles, 40 capsules, 1-2 capsules up to three times daily as needed. - Use DayQuil or NyQuil for symptom relief. - Ensure adequate rest and hydration.  Morbid obesity BMI over 35 with co-morbidities Weight increased to 215.3 lbs. Appetite curbed by Zepbound . - Increase  Zepbound  to 12.5 mg. - Encouraged conscientious eating habits.  Essential hypertension Blood pressure slightly elevated at 132/76  mmHg. OTC medications may affect control. - Continue triamterene  HCTZ 37.5/25 mg. - Monitor blood pressure regularly.  Chronic insomnia Insomnia persists, previously exacerbated by depression but now just chronic . - Prescribed seroquel  for six months.        [1]  Current Outpatient Medications:    acetaminophen  (TYLENOL ) 500 MG tablet, Take 500 mg by mouth every 6 (six) hours as needed for moderate pain (pain score 4-6)., Disp: , Rfl:    Cholecalciferol (VITAMIN D ) 50 MCG (2000 UT) CAPS, Take 1 capsule by mouth daily., Disp: , Rfl:    fexofenadine (ALLEGRA) 180 MG tablet, Take 180 mg by mouth daily., Disp: , Rfl:    QUEtiapine  (SEROQUEL ) 25 MG tablet, Take 1 tablet (25 mg total) by mouth every evening., Disp: 90 tablet, Rfl: 0   Sodium Hyaluronate, oral, (HYALURONIC ACID) 100 MG CAPS, Take 850 mg by mouth daily., Disp: , Rfl:    tirzepatide  (ZEPBOUND ) 10 MG/0.5ML Pen, INJECT 10 MG INTO THE SKIN ONE TIME PER WEEK, Disp: 2 mL, Rfl: 0   triamterene -hydrochlorothiazide  (MAXZIDE -25) 37.5-25 MG tablet, Take 1 tablet by mouth daily., Disp: 90 tablet, Rfl: 0 [2]  Allergies Allergen Reactions   Ace  Inhibitors Other (See Comments)    Cough. Pt states specifically catopril

## 2024-10-21 ENCOUNTER — Ambulatory Visit: Admitting: Family Medicine

## 2024-10-27 ENCOUNTER — Other Ambulatory Visit: Payer: Self-pay | Admitting: Family Medicine

## 2025-01-06 ENCOUNTER — Ambulatory Visit: Admitting: Family Medicine

## 2025-09-21 ENCOUNTER — Encounter: Admitting: Family Medicine

## 2025-09-29 ENCOUNTER — Ambulatory Visit
